# Patient Record
Sex: Male | Born: 1960 | Race: White | Hispanic: No | Marital: Married | Smoking: Never smoker
Health system: Southern US, Community
[De-identification: ages and names within clinical notes are randomized; demographics above are authoritative.]

## PROBLEM LIST (undated history)

## (undated) DIAGNOSIS — N189 Chronic kidney disease, unspecified: Secondary | ICD-10-CM

## (undated) DIAGNOSIS — I10 Essential (primary) hypertension: Secondary | ICD-10-CM

## (undated) DIAGNOSIS — G709 Myoneural disorder, unspecified: Secondary | ICD-10-CM

## (undated) DIAGNOSIS — E669 Obesity, unspecified: Secondary | ICD-10-CM

## (undated) DIAGNOSIS — G4730 Sleep apnea, unspecified: Secondary | ICD-10-CM

## (undated) DIAGNOSIS — E119 Type 2 diabetes mellitus without complications: Secondary | ICD-10-CM

## (undated) HISTORY — DX: Obesity, unspecified: E66.9

## (undated) HISTORY — DX: Sleep apnea, unspecified: G47.30

## (undated) HISTORY — PX: ROTATOR CUFF REPAIR: SHX139

## (undated) HISTORY — PX: CARPAL TUNNEL RELEASE: SHX101

## (undated) HISTORY — PX: CERVICAL SPINE SURGERY: SHX589

## (undated) HISTORY — PX: BACK SURGERY: SHX140

---

## 2000-12-14 ENCOUNTER — Encounter: Payer: Self-pay | Admitting: Pulmonary Disease

## 2000-12-14 ENCOUNTER — Ambulatory Visit (HOSPITAL_BASED_OUTPATIENT_CLINIC_OR_DEPARTMENT_OTHER): Admission: RE | Admit: 2000-12-14 | Discharge: 2000-12-14 | Payer: Self-pay | Admitting: Pulmonary Disease

## 2001-01-17 ENCOUNTER — Encounter: Admission: RE | Admit: 2001-01-17 | Discharge: 2001-04-17 | Payer: Self-pay | Admitting: Family Medicine

## 2002-10-17 ENCOUNTER — Encounter: Payer: Self-pay | Admitting: Family Medicine

## 2002-10-17 ENCOUNTER — Encounter: Admission: RE | Admit: 2002-10-17 | Discharge: 2002-10-17 | Payer: Self-pay | Admitting: Family Medicine

## 2002-10-28 ENCOUNTER — Encounter: Payer: Self-pay | Admitting: Family Medicine

## 2002-10-28 ENCOUNTER — Ambulatory Visit (HOSPITAL_COMMUNITY): Admission: RE | Admit: 2002-10-28 | Discharge: 2002-10-28 | Payer: Self-pay | Admitting: Family Medicine

## 2003-01-14 ENCOUNTER — Encounter: Admission: RE | Admit: 2003-01-14 | Discharge: 2003-01-14 | Payer: Self-pay | Admitting: Family Medicine

## 2003-02-05 ENCOUNTER — Emergency Department (HOSPITAL_COMMUNITY): Admission: EM | Admit: 2003-02-05 | Discharge: 2003-02-05 | Payer: Self-pay | Admitting: Emergency Medicine

## 2003-04-04 ENCOUNTER — Encounter: Admission: RE | Admit: 2003-04-04 | Discharge: 2003-04-04 | Payer: Self-pay | Admitting: Family Medicine

## 2003-10-11 ENCOUNTER — Encounter: Admission: RE | Admit: 2003-10-11 | Discharge: 2003-10-11 | Payer: Self-pay | Admitting: Orthopaedic Surgery

## 2003-11-13 ENCOUNTER — Ambulatory Visit (HOSPITAL_COMMUNITY): Admission: RE | Admit: 2003-11-13 | Discharge: 2003-11-13 | Payer: Self-pay | Admitting: *Deleted

## 2003-12-03 ENCOUNTER — Inpatient Hospital Stay (HOSPITAL_COMMUNITY): Admission: RE | Admit: 2003-12-03 | Discharge: 2003-12-08 | Payer: Self-pay | Admitting: Orthopaedic Surgery

## 2004-11-04 ENCOUNTER — Encounter: Admission: RE | Admit: 2004-11-04 | Discharge: 2004-11-04 | Payer: Self-pay | Admitting: Orthopaedic Surgery

## 2004-11-15 ENCOUNTER — Emergency Department (HOSPITAL_COMMUNITY): Admission: EM | Admit: 2004-11-15 | Discharge: 2004-11-15 | Payer: Self-pay | Admitting: *Deleted

## 2005-01-07 ENCOUNTER — Ambulatory Visit (HOSPITAL_COMMUNITY): Admission: RE | Admit: 2005-01-07 | Discharge: 2005-01-08 | Payer: Self-pay | Admitting: Orthopaedic Surgery

## 2005-03-15 ENCOUNTER — Ambulatory Visit: Payer: Self-pay | Admitting: Pulmonary Disease

## 2005-04-30 ENCOUNTER — Ambulatory Visit: Payer: Self-pay | Admitting: Pulmonary Disease

## 2005-05-29 ENCOUNTER — Encounter: Admission: RE | Admit: 2005-05-29 | Discharge: 2005-05-29 | Payer: Self-pay | Admitting: Orthopaedic Surgery

## 2005-08-19 ENCOUNTER — Inpatient Hospital Stay (HOSPITAL_COMMUNITY): Admission: RE | Admit: 2005-08-19 | Discharge: 2005-08-23 | Payer: Self-pay | Admitting: Orthopaedic Surgery

## 2005-08-27 ENCOUNTER — Encounter: Payer: Self-pay | Admitting: Vascular Surgery

## 2005-08-27 ENCOUNTER — Inpatient Hospital Stay (HOSPITAL_COMMUNITY): Admission: EM | Admit: 2005-08-27 | Discharge: 2005-08-28 | Payer: Self-pay | Admitting: Emergency Medicine

## 2005-10-27 ENCOUNTER — Ambulatory Visit: Payer: Self-pay | Admitting: Pulmonary Disease

## 2005-12-01 ENCOUNTER — Encounter: Admission: RE | Admit: 2005-12-01 | Discharge: 2006-02-10 | Payer: Self-pay | Admitting: Orthopaedic Surgery

## 2006-03-14 ENCOUNTER — Emergency Department (HOSPITAL_COMMUNITY): Admission: EM | Admit: 2006-03-14 | Discharge: 2006-03-14 | Payer: Self-pay | Admitting: Emergency Medicine

## 2006-08-24 ENCOUNTER — Ambulatory Visit: Payer: Self-pay | Admitting: Pulmonary Disease

## 2006-10-18 ENCOUNTER — Encounter
Admission: RE | Admit: 2006-10-18 | Discharge: 2007-01-16 | Payer: Self-pay | Admitting: Physical Medicine & Rehabilitation

## 2006-10-18 ENCOUNTER — Ambulatory Visit: Payer: Self-pay | Admitting: Physical Medicine & Rehabilitation

## 2006-11-23 ENCOUNTER — Ambulatory Visit: Payer: Self-pay | Admitting: Physical Medicine & Rehabilitation

## 2007-01-11 ENCOUNTER — Ambulatory Visit: Payer: Self-pay | Admitting: Physical Medicine & Rehabilitation

## 2007-01-11 ENCOUNTER — Encounter
Admission: RE | Admit: 2007-01-11 | Discharge: 2007-03-20 | Payer: Self-pay | Admitting: Physical Medicine & Rehabilitation

## 2007-03-03 ENCOUNTER — Telehealth (INDEPENDENT_AMBULATORY_CARE_PROVIDER_SITE_OTHER): Payer: Self-pay | Admitting: *Deleted

## 2007-03-10 DIAGNOSIS — G4730 Sleep apnea, unspecified: Secondary | ICD-10-CM | POA: Insufficient documentation

## 2007-03-13 ENCOUNTER — Ambulatory Visit: Payer: Self-pay | Admitting: Pulmonary Disease

## 2007-03-13 DIAGNOSIS — E669 Obesity, unspecified: Secondary | ICD-10-CM

## 2007-03-20 ENCOUNTER — Telehealth (INDEPENDENT_AMBULATORY_CARE_PROVIDER_SITE_OTHER): Payer: Self-pay | Admitting: *Deleted

## 2007-03-29 ENCOUNTER — Encounter: Admission: RE | Admit: 2007-03-29 | Discharge: 2007-05-02 | Payer: Self-pay | Admitting: Orthopaedic Surgery

## 2007-04-04 ENCOUNTER — Encounter: Payer: Self-pay | Admitting: Pulmonary Disease

## 2007-04-19 ENCOUNTER — Ambulatory Visit: Payer: Self-pay | Admitting: Pulmonary Disease

## 2007-05-04 ENCOUNTER — Encounter: Admission: RE | Admit: 2007-05-04 | Discharge: 2007-05-04 | Payer: Self-pay | Admitting: Orthopaedic Surgery

## 2008-01-23 ENCOUNTER — Encounter: Admission: RE | Admit: 2008-01-23 | Discharge: 2008-01-23 | Payer: Self-pay | Admitting: Orthopaedic Surgery

## 2008-02-09 ENCOUNTER — Encounter: Admission: RE | Admit: 2008-02-09 | Discharge: 2008-02-09 | Payer: Self-pay | Admitting: Orthopaedic Surgery

## 2008-04-10 ENCOUNTER — Encounter: Admission: RE | Admit: 2008-04-10 | Discharge: 2008-04-10 | Payer: Self-pay | Admitting: Orthopaedic Surgery

## 2009-01-10 ENCOUNTER — Encounter: Payer: Self-pay | Admitting: Pulmonary Disease

## 2009-10-16 ENCOUNTER — Ambulatory Visit: Payer: Self-pay | Admitting: Pulmonary Disease

## 2009-10-16 DIAGNOSIS — G4733 Obstructive sleep apnea (adult) (pediatric): Secondary | ICD-10-CM | POA: Insufficient documentation

## 2009-12-01 ENCOUNTER — Ambulatory Visit: Payer: Self-pay | Admitting: Licensed Clinical Social Worker

## 2009-12-08 ENCOUNTER — Ambulatory Visit: Payer: Self-pay | Admitting: Licensed Clinical Social Worker

## 2009-12-15 ENCOUNTER — Ambulatory Visit: Payer: Self-pay | Admitting: Licensed Clinical Social Worker

## 2009-12-23 ENCOUNTER — Ambulatory Visit: Payer: Self-pay | Admitting: Licensed Clinical Social Worker

## 2010-01-06 ENCOUNTER — Ambulatory Visit: Admit: 2010-01-06 | Payer: Self-pay | Admitting: Licensed Clinical Social Worker

## 2010-01-24 ENCOUNTER — Encounter: Payer: Self-pay | Admitting: Family Medicine

## 2010-02-03 NOTE — Assessment & Plan Note (Signed)
Summary: rov for osa   Visit Type:  Follow-up  CC:  follow up. pt states wear cpap everynight x 6 hrs a night. Pt states he is not having any prolems with mask/machine. Marland Kitchen  History of Present Illness: the pt comes in today for f/u of his known osa.  He is wearing bipap compliantly, and feels he is sleeping well with the device.  His only complaint is ongoing mask fit issues, and we can work on this.  He is also due for a new machine.  The pt feels his alertness during the day is satisfactory  Current Medications (verified): 1)  Hydrocodone-Acetaminophen 10-500 Mg Tabs (Hydrocodone-Acetaminophen) .Marland Kitchen.. 1 1/2 Tablets Two Times A Day Up To 4 Per Day As Needed 2)  Robaxin-750 750 Mg  Tabs (Methocarbamol) .Marland Kitchen.. 1 Tab Four Times A Day As Needed 3)  Colchicine 0.6 Mg  Tabs (Colchicine) .... Once Daily 4)  Allopurinol 100 Mg  Tabs (Allopurinol) .... Take 4 Tabs ( Total of 400mg ) By Mouth Once Daily 5)  Ibuprofen 800 Mg  Tabs (Ibuprofen) .... Three Times A Day 6)  Nortriptyline Hcl 25 Mg  Caps (Nortriptyline Hcl) .... At Bedtime 7)  Januvia 25 Mg Tabs (Sitagliptin Phosphate) .... Once Daily 8)  Diovan Hct 160-12.5 Mg Tabs (Valsartan-Hydrochlorothiazide) .... Once Daily 9)  Crestor 5 Mg Tabs (Rosuvastatin Calcium) .... Once Daily 10)  Aspirin 81 Mg Tabs (Aspirin) .... Once Daily  Allergies (verified): 1)  ! Keflex  Past History:  Past medical, surgical, family and social histories (including risk factors) reviewed, and no changes noted (except as noted below).  Past Medical History: Reviewed history from 04/19/2007 and no changes required. Current Problems:  OBESITY (ICD-278.00) SLEEP APNEA (ICD-780.57)    Past Surgical History: Reviewed history from 03/13/2007 and no changes required. Back surgery  Family History: Reviewed history and no changes required.  Social History: Reviewed history and no changes required.  Review of Systems       The patient complains of hand/feet  swelling.  The patient denies shortness of breath with activity, shortness of breath at rest, productive cough, non-productive cough, coughing up blood, chest pain, irregular heartbeats, acid heartburn, indigestion, loss of appetite, weight change, abdominal pain, difficulty swallowing, sore throat, tooth/dental problems, headaches, nasal congestion/difficulty breathing through nose, sneezing, itching, ear ache, anxiety, depression, joint stiffness or pain, rash, change in color of mucus, and fever.    Vital Signs:  Patient profile:   50 year old male Height:      73 inches Weight:      369 pounds BMI:     48.86 O2 Sat:      95 % on Room air Temp:     98.3 degrees F oral Pulse rate:   78 / minute BP sitting:   142 / 78  (left arm) Cuff size:   large  Vitals Entered By: Charma Igo (October 16, 2009 10:15 AM)  O2 Flow:  Room air CC: follow up. pt states wear cpap everynight x 6 hrs a night. Pt states he is not having any prolems with mask/machine.  Comments meds and allergies updated Phone number updated Charma Igo  October 16, 2009 10:16 AM    Physical Exam  General:  obese male in nad Nose:  no skin breakdown or pressure necrosis from his cpap mask Extremities:  mild edema, no cyanosis  Neurologic:  alert, does not appear sleepy, moves all 4.   Impression & Recommendations:  Problem # 1:  OBSTRUCTIVE SLEEP  APNEA (ICD-327.23) the pt is doing well with is bilevel device wrt his sleep and daytime alertness, but feels that his mask fit is not optimal.  Will work on getting a new mask from his DME, and he is also due for a new machine as well.  I have encouraged the pt to work aggressively on weight loss, and to f/u with me in one year.  He is to call if having issues with his device.  Medications Added to Medication List This Visit: 1)  Hydrocodone-acetaminophen 10-500 Mg Tabs (Hydrocodone-acetaminophen) .Marland Kitchen.. 1 1/2 tablets two times a day up to 4 per day as needed 2)  Januvia 25  Mg Tabs (Sitagliptin phosphate) .... Once daily 3)  Diovan Hct 160-12.5 Mg Tabs (Valsartan-hydrochlorothiazide) .... Once daily 4)  Crestor 5 Mg Tabs (Rosuvastatin calcium) .... Once daily 5)  Aspirin 81 Mg Tabs (Aspirin) .... Once daily  Other Orders: Est. Patient Level III [id number]) DME Referral (DME)  Patient Instructions: 1)  will get you a new bipap machine and mask 2)  work on weight loss  3)  followup with me in 56mos.   Immunization History:  Influenza Immunization History:    Influenza:  historical (09/04/2009)

## 2010-02-03 NOTE — Letter (Signed)
Summary: LMN for CPAP Supplies DENIED/[facility]  LMN for CPAP Supplies DENIED/[facility]   Imported By: Phillis Knack 01/16/2009 14:03:38  _____________________________________________________________________  External Attachment:    Type:   Image     Comment:   External Document

## 2010-05-19 NOTE — Assessment & Plan Note (Signed)
The patient follows up today.  I last saw him October 24, 2006.  He  underwent a right S1 transforaminal epidural steroid injection under  fluoroscopic guidance.  He initially had about 30% relief of pain down  his leg but he had complete relief of his hip pain, and he is quite  pleased about that.  About 6 days after the injection he was lifting a  desk and noted increasing numbness and burning pain down in the right  leg.  This has subsided to the point where he is back to his usual  decreased sensation over the sole of his foot.   He states that medications are helping with his pain and he is using the  following:  1. Hydrocodone 15 mg b.i.d.  2. Elavil 25 mg q.h.s.  This is causing excessive daytime drowsiness.  3. Robaxin 750 mg q.i.d.   The pain level averaging 9/10, described as burning.  He walks 15  minutes at a time.   REVIEW OF SYSTEMS:  Positive for numbness, trouble walking and  depression.   EXAM:  Blood pressure 151/79, pulse 69, respiratory rate is 18, O2  saturation 97% on room air.  GENERAL:  In no acute distress.  Mood and affect appropriate.  Orientation x3.  Gait is without evidence of toe drag or knee instability.  He has normal  strength in the bilateral plantar flexors, dorsiflexor, knee extensors.  He has decreased right S1 reflex compared to the left side, decreased  right S1 sensation to pinprick compared to the left side.  Hip, ankle  and knee range of motion are intact.   IMPRESSION:  1. Chronic right S1 radiculopathy.  2. Lumbar postlaminectomy syndrome, status post T10 through L5 fusion.   We discussed at great length his pain management plan.  We talked about  indications for going back to see Dr. Patrice Paradise in terms of increasing  neurologic symptoms in the right lower extremity.  These are not present  at the current time.  We did talk about frequencies of injections that  he could obtain as part of his pain management program.  We also talked  about getting an EMG NCV as a baseline in a way to more objectively  monitor his neurologic function.   We discussed this at great length.  Over 50% of his visit was spent  doing this, and he understands what the plan is and agrees.      Charlett Blake, M.D.     AEK/MedQ  D:  11/01/2006 16:21:11  T:  11/02/2006 08:40:48  Job #:  [id number]   cc:   Rennis Harding, M.D.  Fax: [phone]

## 2010-05-19 NOTE — Procedures (Signed)
NAME:  Peter Bryan, Peter Bryan                 ACCOUNT NO.:  [id number]   MEDICAL RECORD NO.:  [id number]          PATIENT TYPE:  REC   LOCATION:  [facility]                          FACILITY:  [facility]   PHYSICIAN:  Charlett Blake, M.D.DATE OF BIRTH:  [birth year]   DATE OF PROCEDURE:  01/12/2007  DATE OF DISCHARGE:                               OPERATIVE REPORT   PROCEDURE:  Right S1 transforaminal epidural steroid injection under  fluoroscopic guidance.   INDICATION:  Right lower extremity radiculopathy only partially received  by medication and other conservative care, has had good results with  previous injection done November 24, 2006.  Informed consent was  obtained after describing risks and benefits of the procedure to the  patient.  These include bleeding, bruising, infection, loss of bowel or  bladder function, temporary or permanent paralysis.  He elects to  proceed and has given written consent.  Patient placed prone on  fluoroscopy table.  Betadine prep, sterile drape, a 25-gauge inch and a  half needle was used to anesthetize skin and subcu tissue, 1% lidocaine  x2 mL.  A 22-gauge 5 inch spinal needle was inserted under fluoroscopic  guidance, right S1 foramen.  Omnipaque 180 showed good epidural spread.  The patient tolerated procedure well.  Pre and post injection vitals  stable.  Post injection instructions given.  Able to ambulate and had no  difficulty after the procedure.      Charlett Blake, M.D.  Electronically Signed     AEK/MEDQ  D:  01/12/2007 14:13:34  T:  01/12/2007 16:18:53  Job:  [id number]   cc:   Rennis Harding, M.D.  Fax: [phone]

## 2010-05-19 NOTE — Group Therapy Note (Signed)
REASON FOR CONSULTATION:  Pain management with chronic postop fusion  pain as well as evaluation for spinal injection.   HISTORY:  A 50 year old male who had back pain starting in 1992.  He had  his first low back surgery at that time.  I do not have the original  records from that.  Per his report, he underwent L2-3 laminectomy in  February, 1992.  He had an L1-2 laminectomy in November, 1993 and an L3-  4 laminectomy in November, 1995.  Then he underwent an L1-4 fusion in  December, 2005.  He underwent C3-4 fusion in January, 2007 and in  August, 2007 underwent a T10-L4 fusion.  He had no postoperative  complications, per his report.  He had a bone stimulator applied.  He  was managed with ibuprofen and hydrocodone.   Patient's medical records from Dr. Patrice Paradise indicate there was an L2-5  instrumented fusion.  He has a history of S1 radiculopathy as well.  Patient was also started by Dr. Patrice Paradise on Elavil and Robaxin.  He has had  some problems with his hands falling asleep when he was trying to type.   SOCIAL HISTORY:  Married.  Has had three children.   PAST MEDICAL HISTORY:  In addition to the above, has had hypertension  and gout.   MEDICATIONS:  1. Hydrocodone 3 tablets twice daily 5/500.  2. Methylcarbimol 750 four times a day.  3. Ibuprofen 800 t.i.d.  4. Lisinopril 10 mg daily.  5. Colchicine 0.6 daily.  6. Allopurinol 100 mg daily.  7. Elavil 25 daily.   PHYSICAL EXAMINATION:  Blood pressure is 171/93.  Recheck will be done.  Pulse 81, respiratory rate 18, O2 sat 98% on room air.  This is a large, obese male in no acute distress.  His back has tenderness to palpation along the incision area in the  lower aspect.  He has pain with extension greater than with flexion in  the lumbar spine.  He does have reduced spine range of motion and  extension at approximately 25% with flexion at about 50%.  The lower  extremity range of motion is good at the ankles, hips, and knees.   His  Faber's test refers pain to the right SI joint and on the left side more  to the lower part of the lumbar spine.  No hip discomfort.  His  sensation is normal in the upper and lower extremities.  Spurling's test  is negative.  Range of motion is normal in the upper extremities.  Gait  is without evidence of toe drag or knee instability.  Additional sensory  testing reveals decreased right L3 sensation and decreased deep tendon  reflexes throughout uppers, but asymmetric in the right patella and  right ankle.   IMPRESSION:  1. Chronic postoperative pain following T10-L5 fusion.  In fact, has      had multiple lumbar procedures and has had chronic pain essentially      for 15 years.  He has reported facet arthropathy, L5-S1 area.  His      last MRI was in 2007.  Films are not available for my review;      however, plain films, lumbar, from one year ago, are available but      poor quality online.  They do reveal instrumentation, T10-L4.  2. Right S1 radiculopathy.  May benefit from epidural.  3. 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  4. Upper extremity tingling and numbness, question carpal tunnel  versus cervical radiculitis.  His last MRI of the cervical spine      without contrast on May 25, 2005 indicates a C6-7 fusion and a C3      disk to the right and C7-T1 disk to the left.  This does not      correlate with his hand symptoms except on the left side.   Will consider an EMG, NCV's, as he may have peripheral nerve  compression, focal compressive neuropathy.   I will see him back initially for the facet injections.   We will do a urine drug screen today.      Charlett Blake, M.D.  Electronically Signed     AEK/MedQ  D:  10/18/2006 13:32:35  T:  10/19/2006 08:56:07  Job #:  [id number]   cc:   Rennis Harding, M.D.  Fax: [phone]

## 2010-05-19 NOTE — Procedures (Signed)
NAME:  CHRISTIN, JONES                 ACCOUNT NO.:  [id number]   MEDICAL RECORD NO.:  [id number]          PATIENT TYPE:  REC   LOCATION:  [facility]                          FACILITY:  [facility]   PHYSICIAN:  Charlett Blake, M.D.DATE OF BIRTH:  26-Jun-1960   DATE OF PROCEDURE:  DATE OF DISCHARGE:                               OPERATIVE REPORT   Mr. Gress is a 50 year old male with chronic S1 radiculitis who has had  good relief with the S1 transforaminal injection.   Pain is only partially relieved by medication management and interferes  with self-care and mobility.   Informed consent was obtained after describing the risks and benefits of  the procedure to the patient.  These include bleeding, bruising,  infection, loss of bowel and bladder function, temporary or paralysis.  He elects to proceed and has given written consent.  The patient placed  prone on fluoroscopy table.  Betadine prep, sterile drape.  25-gauge 1-  1/2-inch needle was used to anesthetize the skin and subcu tissue with  1% lidocaine x2 mL.  A 22-gauge 5-inch spinal needle was inserted under  fluoroscopic guidance into the right S1 foramen.  AP and lateral images  utilized.  Omnipaque 180 showed good epidural spread.  The patient  tolerated the procedure well.  Pre and post-injection vitals stable.  Post-injection instructions given.  A prescription written for  nortriptyline 10 q.h.s.      Charlett Blake, M.D.  Electronically Signed     AEK/MEDQ  D:  11/24/2006 09:56:48  T:  11/24/2006 16:32:25  Job:  [id number]   cc:   Rennis Harding, M.D.  Fax: [phone]

## 2010-05-19 NOTE — Procedures (Signed)
Peter Bryan, Peter Bryan                 ACCOUNT NO.:  [id number]   MEDICAL RECORD NO.:  [id number]          PATIENT TYPE:  REC   LOCATION:  [facility]                          FACILITY:  [facility]   PHYSICIAN:  Charlett Blake, M.D.DATE OF BIRTH:  11-14-1960   DATE OF PROCEDURE:  10/24/2006  DATE OF DISCHARGE:                               OPERATIVE REPORT   PROCEDURE:  Lumbar epidural steroid injection under fluoroscopic  guidance.   INDICATIONS:  Severe radicular symptoms only partially responsive to  medication management.  Pain is rated as a 9 out of 10 level.  Pain  interferes with sleep and even problems with toileting.  He can no  longer work.   Informed consent was obtained after describing the risks and benefits of  the procedure to the patient.  These include bleeding, bruising,  infection, loss of bowel and bladder function, temporary or permanent  paralysis.  He elects to proceed and has given written consent.  The  patient was placed prone on the fluoroscopy table and Betadine prepped  and sterilely draped.  A 25-gauge, 1.5-inch needle was used to  anesthetize the skin and subcutaneous tissue with 1% lidocaine x2 mL and  a 22-gauge, 3.5-inch spinal needle was inserted under fluoroscopic  guidance into the right S1 sacral foramen.  AP, lateral and oblique  imaging was utilized. Omnipaque 180 x1 mL demonstrated good foraminal  spread and the solution containing 1 mL of 40 mg/mL Depo-Medrol and 2 mL  of 1% of lidocaine was injected.  The patient tolerated the procedure  well.  Preinjection and postinjection vitals were stable.      Charlett Blake, M.D.  Electronically Signed     AEK/MEDQ  D:  10/26/2006 15:23:23  T:  10/27/2006 12:35:56  Job:  [id number]

## 2010-05-19 NOTE — Assessment & Plan Note (Signed)
Peter Bryan has a history of T10 to L5 fusion.  He has S1 radiculopathy  with chronic right lower extremity pain.  He underwent right S1  transforaminal epidural.   CURRENT MEDICATIONS:  1. Hydrocodone 15 mg b.i.d.  2. Robaxin 750 q.i.d.  3. Ibuprofen 800 t.i.d.  4. Elavil 25 q.h.s.  5. In addition he is on Lisinopril.  6. Colchicine.  7. Allopurinol.   INTERVAL HISTORY:  S1 transforaminal epidural steroid injection  10/24/06, he had about a 30%-40% reduction in pain initially but then  about 6 days after the injection he developed increasing burning pain in  his right leg.  He has had no fever, chills.  He has had no back pain in  that injection site area.  His average pain is around 9/10.  Currently  7.  Interferes with activity at 7/10 level, sleep is poor.  Relief from  meds, however, is good. He can walk 15 minutes at a time.   REVIEW OF SYSTEMS:  Positive for numbness, trouble walking, depression.   PHYSICAL EXAMINATION:  VITALS:  Blood pressure 151/79, pulse 69,  respirations 18, O2 sat 97% on room air.  GENERAL:  Sensation is equal bilateral L5 dermatomes, reduced S1  compared to the left.  He has bilateral symmetric L4 dermatomal  sensation.  EXTREMITIES:  Deep tendon reflexes 1+ right ankle, 2+ left ankle. His  strength is equal in the plantar flexion of his ankle, dorsiflexors and  the extensors.  His gait is without evidence of toe drag or knee  instability.   IMPRESSION:  1. Chronic right S1 radiculopathy.  2. There is some exacerbation of pain but this started about 6 days      after the injection.  We spoke about his activities at that time      and in fact he remembers moving a desk on the day that he started      having the increased symptoms.  We discussed that this is more      likely the etiology.  He does not have any new neurologic deficits      and certainly his symptoms seem to be resolving at this time as      well.   PLAN:  1. Will go ahead and  continue his medications.  Will change his      hydrocodone 7.5 to 2 tablets twice a day.  2. Excessive drowsiness in the morning from Elavil.  Will switch to      Nortriptyline 25 mg q.h.s.  3. Discontinue Robaxin 750 q.i.d.   I have an appointment to see him on the 20th for repeat injection. We  discussed plans and he agrees.      Charlett Blake, M.D.  Electronically Signed     AEK/MedQ  D:  11/01/2006 16:17:10  T:  11/02/2006 08:35:21  Job #:  [id number]   cc:   Rennis Harding, M.D.  Fax: [phone]

## 2010-05-19 NOTE — Assessment & Plan Note (Signed)
Patient returns today, he was last seen by me November 24, 2006.  A 50-  year-old male, chronic S1 radiculitis.  He has a thoracolumbar fusion in  2007, chronic pain since that time.  His last prescription for  hydrocodone was November 24, 2006.  He was to come back on December 20  but needed to reschedule.  He states that he has called office multiple  times, there has been no documentation.  He states he talked to April  our front desk person.  Once again, she did not remember this  conversation.   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  He states that he received the  methadone from his wife.   Had discussion with Mr. Locklar about this, stating that that is a  violation of his controlled substance agreement.  This would result in a  discharge.  He was upset about this.  He will plan to follow up with Dr.  Patrice Paradise.      Charlett Blake, M.D.  Electronically Signed     AEK/MedQ  D:  01/12/2007 14:16:52  T:  01/12/2007 15:24:54  Job #:  [id number]   cc:   Rennis Harding, M.D.  Fax: [phone]

## 2010-05-22 NOTE — Op Note (Signed)
NAME:  Peter Bryan, Peter Bryan                 ACCOUNT NO.:  [id number]   MEDICAL RECORD NO.:  [id number]          PATIENT TYPE:  INP   LOCATION:  5001                         FACILITY:  [facility]   PHYSICIAN:  Rennis Harding, M.D.        DATE OF BIRTH:  06-28-1960   DATE OF PROCEDURE:  12/04/2003  DATE OF DISCHARGE:                                 OPERATIVE REPORT   DIAGNOSES:  1.  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  2.  Degenerative disc disease.  3.  Obesity.   PROCEDURES:  1.  Revision L1 through L4 lumbar laminectomy with decompression of the      common thecal sac and nerve roots bilaterally.  2.  Transforaminal lumbar interbody fusion L1-2 and L2-3 with placement of      two PEEK cages.  3.  Segmental pedicle screw instrumentation L1 through L4 using bilateral      Concept system.  4.  Posterior spinal arthrodesis L1 through L4.  5.  Local autogenous bone graft supplemented with 15 mL Grafton allograft      and BMP.  6.  Monitoring with testing of each pedicle screws, monitoring of free      running EMG x3 hours.   SURGEON:  Rennis Harding, M.D.   ASSISTANT:  Karenann Cai, P.A.   ANESTHESIA:  General endotracheal anesthesia.   COMPLICATIONS:  None.   INDICATIONS FOR PROCEDURE:  The patient is a very pleasant 50 year old male  who is status post two previous lumbar laminectomies.  He has recurrent  spinal stenosis and degenerative spondylolisthesis and has elected to  undergo revision decompression and fusion in hopes of improving his  symptoms.   DESCRIPTION OF PROCEDURE:  The patient was properly identified in the  holding area, taken to the operating room and underwent general endotracheal  anesthesia without difficulty.  Given prophylactic IV antibiotics.  Carefully turned prone onto the AcroMed four poster positioning frame.  All  bony prominences were padded.  Face and eyes were protected at all times.  The back was prepped and draped in the usual  sterile fashion.  His previous  midline incision was utilized and extended several inches proximally.  Dissection was carried through the previous scar and deep fascia.  The  paraspinal muscles were elevated out over the tips of the transverse  processes of L1, L2, L3 and L4 bilaterally.  The patient had a very deep  layer of adipose tissue followed by thick muscle.  Deep retractors were  placed.  X-rays were taken to confirm appropriate level.  We found the facet  joints to be hypertrophied and unstable L2-3 was most severely involved.  He  had a previous laminectomy defect at L2-3 and L3-4.  We completed a revision  laminectomy by removing the spinous process and residual lamina of L4 and  working proximally through the epidural fibrosis.  Loupes and headlight  magnification were used to complete the neurolysis.  Great care was taken to  protect the underlying dura.  Curettes were used to dissect the edges of the  previous laminectomy.  Radical decompression was completed decompressing the  thecal sac centrally and then the lateral recess.  We found severe  underlying stenosis at L2-3 greater than L1-2 and then less severe at L3-4.  There was a large disc herniation at L2-3 which was impinging upon the  transversing nerve root and placing the thecal sac dorsally.  This was  removed with pituitary.  We then turned our attention to placing pedicle  screws L1 through L4 bilaterally using anatomic probing technique.  The  pedicles could also be palpated from within the spinal canal.  We had  excellent screw purchase.  The pedicle holes were palpated.  There were no  breeches.  Each pedicle screw was __________ using triggered EMGs and there  were no deleterious changes.  We then turned our attention to the  transforaminal lumbar interbody fusion at L1-2 and L2-3.  Radical  facetectomies completed at both the L1-2 and L2-3 levels to allow creation  of the transforaminal window.  The exiting  and transversing nerve roots were  identified and protected at all times.  Free running EMGs were monitored.  Radical discectomies completed across the contralateral side.  The  cartilaginous end plates were scraped.  The disc spaces were packed with BMP  sponges from the large infused kit as well as local bone graft obtained on  the laminectomy.  The PEEK cages were packed with the bone morphogenic  protein, collagen soaked sponges and inserted obliquely into the interspace.  There were no deleterious changes in the free running EMGs.  We then turned  our attention to completing the posterior spinal fusion by decorticating the  transverse processes of L1, L2, L3 and L4 bilaterally.  The remaining local  bone graft was then packed into the lateral gutters and supplemented with 15  mL of Grafton allograft.  Titanium rods were placed into the polyaxial screw  heads and gentle compression applied across the fused segments before  shearing off the locking cap.  Two cross connectors were placed.  The wound  was irrigated.  Deep Hemovac drain was left in place.  Deep fascia closed  with a running #1 Vicryl suture, subcutaneous layer closed with 0 Vicryl and  2-0 Vicryl followed by a running 3-0 nylon suture to approximate the skin  edges.  Sterile dressing applied.  The patient was turned supine and  extubated without difficulty and transferred to recovery in stable condition  able to move his upper and lower extremities.       MC/MEDQ  D:  12/05/2003  T:  12/06/2003  Job:  [id number]

## 2010-05-22 NOTE — H&P (Signed)
NAME:  MAKYA, LINGREN NO.:  [id number]   MEDICAL RECORD NO.:  [id number]          PATIENT TYPE:  INP   LOCATION:  NA                           FACILITY:  [facility]   PHYSICIAN:  Rennis Harding, M.D.        DATE OF BIRTH:  May 20, 1960   DATE OF ADMISSION:  12/04/2003  DATE OF DISCHARGE:                                HISTORY & PHYSICAL   CHIEF COMPLAINT:  Severe back pain with radiation into both legs.   HISTORY OF PRESENT ILLNESS:  This 50 year old white male is seen by [place] for  progressive and persistent problems concerning pain in his lumbosacral area  with radiation into both lower extremities. The patient has had three  previous back surgeries. He had an L3-4 laminectomy and L4-5 fusion. Another  surgery was performed to the lumbar spine. The patient has had epidural  steroid injections to the lumbar spine with unfortunately no benefit. He  also has had anti-inflammatories. Fortunately, he has maintained a normal  neurological pattern in the lower extremities other than radiculitis.  Straight leg raise is also negative. MRIs have been reviewed and showed some  moderate spinal stenosis of L1-2 with a large central disk herniation at L2-  3 and evidence of interbody fusion at L4-5 with stenosis. Due to the fact  that he has a recurrent and persistent low back pain and after all the risks  and benefits of surgery have been discussed with the patient by Dr. Patrice Paradise  and then myself he would like to proceed with surgery.   The patient is seen today for pre-admission history and physical on November 27, 2003, and fitted with EBI lumbosacral orthosis.   PAST MEDICAL HISTORY:  This gentleman has been in relatively good health.  Dr. Leone Haven with [facility], phone number [phone], has  cleared him for surgery. He has hypertension. No known medical allergies.  Previous surgeries are lumbar laminectomy and diskectomy at L4-5 in February  1992, lumbar diskectomy at  L3-4 in November 1993, and diskectomy of L1, L2,  and L3 in December 1995.   FAMILY HISTORY:  Positive for hypertension in his mother and paternal  grandmother. Heart disease in paternal grandmother and paternal grandfather.   SOCIAL HISTORY:  The patient is married. He is a Research officer, trade [employer]. No  intake of tobacco or alcohol products. He has three children. His family  will be caregiver after surgery. He lives in a second floor apartment with  14 stairs to the apartment. He will need to have adequate physical therapy  postoperative so that he can negotiate these stairs safely.   PHYSICAL EXAMINATION:  VITAL SIGNS: Pulse 80, respirations 12, blood  pressure 142/72.  GENERAL: An alert, cooperative, friendly 50 year old white male who is  somewhat obese.  HEENT:  Normocephalic. PERRLA. Oropharynx is clear. EOMs are intact.  CHEST: Clear to auscultation. No rales or rhonchi.  HEART: Regular rate and rhythm.  No murmurs are heard.  ABDOMEN: Soft, nontender. Liver and spleen not felt.  RECTAL/GENITALIA: Not done; not pertinent to the present illness.  EXTREMITIES: As in present illness above with no neurological motor deficits  in the lower extremities. Negative straight leg raise and reflex is equal.   ADMISSION DIAGNOSES:  1.  Lumbar degenerative scoliosis and spinal stenosis, L1 through L4.  2.  Hypertension.   PLAN:  The patient will undergo L1 to L4 laminectomy with posterior spinal  fusion with pedicle screws, transforaminal lumbar interbody fusion with  allograft and bone morphogenic protein. Again, he is fitted with an ABI and  lumbosacral orthosis today (November 27, 2003) to be used postoperatively.  He will also need a rolling Kogler postoperatively to assist him coming from  a seated to a standing position. He will need extensive physical therapy as  mentioned above so that he can negotiate the 14 steps to his second floor  apartment.       DLU/MEDQ  D:  11/27/2003   T:  11/27/2003  Job:  [id number]   cc:   Leone Haven, M.D.  [address]  [place]  [place] [address]  Fax: [phone]

## 2010-05-22 NOTE — H&P (Signed)
Peter Bryan, SENDER                 ACCOUNT NO.:  [id number]   MEDICAL RECORD NO.:  [id number]          PATIENT TYPE:  INP   LOCATION:  5036                         FACILITY:  [facility]   PHYSICIAN:  Karenann Cai, P.A.    DATE OF BIRTH:  1960-11-05   DATE OF ADMISSION:  08/19/2005  DATE OF DISCHARGE:  08/23/2005                                HISTORY & PHYSICAL   CHIEF COMPLAINT:  Mid and low back pain.   HISTORY OF PRESENT ILLNESS:  Patient is a 50 year old male who has undergone  several previous lumbar surgeries including lumbar fusion.  He was fused  from L1-L5.  He unfortunately has had an increasing pain over the last  several months, actually few years.  His pain is severe and affecting his  activities of daily living and his quality of life has suffered  significantly.  Dr. Patrice Paradise did find a supra-adjacent herniated disk at T12-  L1.  Because of this and his failure to improve it was thought his best  course of management would be extending his fusion and decompressing that  level.  Given the patient's size as well as previous fusions it was thought  we needed to go up several levels to get the appropriate fixation to avoid  hardware failure.  Risks and benefits of this surgery were discussed with  the patient by Dr. Patrice Paradise as well as myself.  He indicated understanding and  opted to proceed.   ALLERGIES:  None.   MEDICATIONS:  Vicodin, Robaxin, ibuprofen, hydrochlorothiazide, and  colchicine.   PAST MEDICAL HISTORY:  1. Sleep apnea.  2. Hypertension.   PAST SURGICAL HISTORY:  Lumbar surgery x6 and ACDF.   SOCIAL HISTORY:  Patient is married.  Denies tobacco use.  Denies alcohol  use.  Has three children.   FAMILY HISTORY:  Noncontributory.   REVIEW OF SYSTEMS:  Negative.   PHYSICAL EXAMINATION:  VITAL SIGNS:  Pulse is 80 and regular, respirations  16 and unlabored, blood pressure is 130/84.  GENERAL APPEARANCE:  Patient is a 50 year old white male who is alert and  oriented, no acute distress.  Well-nourished, well-groomed.  Appears stated  age.  Pleasant, cooperative to examination.  HEENT:  Head is normocephalic, atraumatic.  Pupils equal, round, reactive to  light.  Nares patent.  Pharynx is clear.  CHEST:  Clear to auscultation bilaterally.  No rales, rhonchi, strider,  wheeze, or friction rubs.  BREASTS:  Not pertinent.  Not performed.  HEART:  S1, S2.  Regular rate and rhythm.  No murmurs, rubs, or gallops  noted.  ABDOMEN:  Soft to palpation, nontender, nondistended.  No organomegaly  noted.  GENITOURINARY:  Not pertinent, not performed.  EXTREMITIES:  As per HPI.  SKIN:  Intact without any lesions or rashes.   IMPRESSION:  1. Degenerative disk disease and adjacent segment herniated nucleus      pulposus at T12-L1.  2. Hypertension.  3. Gout.   PLAN:  Admit to [facility] on August 16 for revision decompression  and posterior spinal fusion from T10-L5.  The surgery will be bone  by Dr.  Patrice Paradise.  In the hospital patient will need his BiPAP machine.  We would like  him to use the setting from home.  We will also get a bariatric bed for him  due to his size.      Karenann Cai, P.A.     CM/MEDQ  D:  08/25/2005  T:  08/25/2005  Job:  [id number]

## 2010-05-22 NOTE — Op Note (Signed)
NAME:  Peter Bryan, Peter Bryan                 ACCOUNT NO.:  [id number]   MEDICAL RECORD NO.:  [id number]          PATIENT TYPE:  INP   LOCATION:  5036                         FACILITY:  [facility]   PHYSICIAN:  Rennis Harding, M.D.        DATE OF BIRTH:  Nov 14, 1960   DATE OF PROCEDURE:  08/19/2005  DATE OF DISCHARGE:                                 OPERATIVE REPORT   DIAGNOSES:  1. T12-L1 disk herniation with spinal stenosis.  2. Adjacent segment disease above previous L1-L5 decompression and fusion.  3. Degenerative disk disease and the lumbar spondylosis.   PROCEDURE:  1. Removal of pedicle screw instrumentation segmental from L1-L4.  2. Exploration of spinal fusion, L1-L4.  3. Revision laminectomy T12-L1 and L1-L2 bilaterally with wide      decompression of the thecal sac and nerve roots.  4. Posterior spinal arthrodesis T10-L1.  5. Segmental pedicle screw instrumentation T10 down to L4 using the Abbott      spine encompass system.  6. Transforaminal lumbar interbody fusion T12-L1 from the right side.  7. Local autogenous bone graft supplemented with bone morphogenic protein.   SURGEON:  Rennis Harding, MD   ASSISTANT:  Jaclyn Shaggy Mahar, PA   ANESTHESIA:  General endotracheal.   ESTIMATED BLOOD LOSS:  900 mL.   COMPLICATIONS:  None.   NEEDLE AND SPONGE COUNT:  Correct.   INDICATIONS:  The patient is a pleasant 50 year old male who is status post  multiple previous lumbar surgeries with L1-L4 instrumented decompression and  fusion.  There is an autofusion at L4-5 from a previous diskitis.  He did  well after his last surgery for several years.  Unfortunately he is a very  large man and he has developed degenerative changes at T12-L1 with large  central and right-sided disk herniation with compression on the conus.  He  has been having persistent symptoms in his lower extremities with numbness  and pain.  We have tried various conservative treatments without any  improvement.  We talked about  various surgical options including extending  the decompression and fusion up across the thoracolumbar junction and also  possibly extending the fusion across L5-S1.  After thorough discussion of  the risk and benefits he has elected to now proceed with surgery in hopes of  improving his symptoms.   PROCEDURE:  The patient was identified in the holding area, after informed  consent he was taken to the operating room.  He underwent general  endotracheal anesthesia.  Neuro monitoring was established in the form of  lower extremity EMGs, SEPs and motor evoked potentials.  Prophylactic IV  antibiotics were given.  He was turned prone onto the Acromed four-poster  positioning frame.  All bony prominences were padded.  His face and eyes  were protected at all times.  Because of his large size we could not  completely decompress the abdomen.  The back was prepped and draped usual  sterile fashion.  The previous midline incision was utilized and extended  several inches proximally up above the thoracolumbar junction.  Dissection  was carried through the  previous scar and deep fascia.  Subperiosteal  exposure was carried out to the tips of the transverse processes of T10,  T11, T12 and also exposing the previous instrumentation, all the way down  the L4.  The dissection was very tedious.  Because of his very large body  habitus, this was very difficult.  We eventually achieved exposure and  placed deep retractors.  We then used the appropriate screwdrivers to remove  the caps from the pedicle screws as well as the rods.  We then pulled on the  pedicle screws and there did not appear to be any motion between L1 and L4.  We then carefully dissected out over the fusion mass using the  electrocautery and could not identify any deficits in the fusion mass.  We  felt that the fusion was solid between L1-L4.  We then turned our attention  to completing the laminectomy from the T12-L1 and L1-L2.  The  remaining  spinous process and lamina of L1 was removed using Kerrison punches and  Leksell rongeur.  The previous laminectomy edges were carefully dissected  free using loupes and headlight magnification.  There was a large amount of  scar tissue which had to be worked through and eventually the underlying  spinal canal could be identified.  Once we had the appropriate plane a high-  speed bur was used to further thin the lamina.  The entire spinous process  and lamina of T12 was also removed.  We found severe spinal stenosis at T12-  L1 and as we decompressed the bone, the cord expanded into the defect.  The  decompression was carried out laterally flush with the pedicles.  We  identified the T12 and the L1 nerve roots and confirmed that there were well  decompressed.  We then turned our attention to placing pedicle screws at  T10, T11 and T12 bilaterally.  Using anatomic probing technique and also  direct palpation from within the spinal canal, each starting point was  identified.  The starting point was initiated with the awl.  This Steffee  pedicle probe was then used to cannulate the pedicle.  Each pedicle was  palpated with the ball-tip feeler and there were no breeches.  Each pedicle  was tapped.  We utilized 5.5 x 45 mm screws at T10 and T11 and 6.0 x 45 mm  screws at T12.  The screw purchase was excellent and the bone quality was  good.  Each pedicle screw was then stimulated using triggered EMGs and there  were no deleterious changes.  At this point we elected to perform a  transforaminal lumbar interbody fusion at T12-L1 on the right side to  further decompress the spinal canal and remove the central disk herniation  and also improve the fusion rate across the thoracolumbar junction.  The  remaining facette joint at T12-L1 on the right side was osteotomized.  The  exiting and transversing nerve roots were identified and protected.  The disk herniation was identified.  Epidural  bleeders were coagulated using  bipolar.  The disk space was entered.  Using an Epstein curette, the central  disk rupture was decompressed back into the interspace.  It was then simply  removed with pituitary rongeurs.  At this point we could scrape the  cartilaginous endplates and complete a radical diskectomy across the  contralateral side.  We then packed the disk space with BMP sponges from the  Infuse kit along with local bone graft from the laminectomy.  We then  completed the posterior spinal fusion by decorticating the transverse  processes and remaining lamina of T10, T11, T12 and also the fusion mass at  L1.  The remaining local bone graft was packed into the gutters.  We then  cut titanium rods.  The rods were bent into normal lordosis.  The rods were  placed into the polyaxial screw heads from T10 all the way down to L4.  The  locking caps were applied.  Compression was applied across the T12-L1  segment before shearing off the locking caps..  The wound was irrigated.  Two cross connectors were placed.  Gelfoam was left over the exposed  epidural space.  A deep Hemovac drain was left.  The deep fascia closed with  interrupted #1 Vicryl suture.  Subcutaneous layer closed with 0 Vicryl  followed by 2-0 Vicryl suture.  The skin approximated using 3-0 interrupted  nylon suture.  Sterile dressing applied.  It should be noted that my  assistant [facility], PA was  present throughout the procedure including during positioning, during the  exposure, during the decompression, fusion, the instrumentation and also  with the entire wound closure.  There were no deleterious changes in the  neuro monitoring throughout the procedure.      Rennis Harding, M.D.  Electronically Signed     MC/MEDQ  D:  08/19/2005  T:  08/20/2005  Job:  [id number]

## 2010-05-22 NOTE — Discharge Summary (Signed)
Peter Bryan, Peter Bryan                 ACCOUNT NO.:  [id number]   MEDICAL RECORD NO.:  [id number]          PATIENT TYPE:  INP   LOCATION:  5036                         FACILITY:  [facility]   PHYSICIAN:  Rennis Harding, M.D.        DATE OF BIRTH:  [birth year]   DATE OF ADMISSION:  08/19/2005  DATE OF DISCHARGE:  08/23/2005                                 DISCHARGE SUMMARY   ADMITTING DIAGNOSES:  1. Spinal stenosis and HNP T12-L1.  2. Hypertension.  3. Gout.  4. Obesity.  5. Sleep apnea.   DISCHARGE DIAGNOSES:  1. Status post T10-L4 posterior spinal fusion.  2. Blood loss anemia.  3. Hyperglycemia postoperatively.  4. Hypertension.  5. Gout.  6. Obesity.  7. Sleep apnea.   PROCEDURE:  On August 19, 2005, patient was taken to the operating room for  a revision of posterior spinal fusion from T10-L4 and T12-L1 laminectomy and  discectomy.  Surgeon was Rennis Harding, assistant, [employer], PA-C.  Anesthesia was general.   CONSULTS:  None.   LABS:  Preop CBC with diff was normal with the exception of hemoglobin of  11.9, hematocrit of 35.0.  Postoperatively, hemoglobin and hematocrit were  monitored x4 days, reached a low of 9.3 and 27.4.  On August 21, 2005, coags  were normal preoperatively with the exception of PTT of 39.  Complete  metabolic panel preoperatively was normal with the exception of glucose of  136 and albumin of 3.4.  Postoperatively, basic metabolic panel monitored x3  days, glucose was elevated ranging between 110 and 141, calcium was 8.3 on  August 18.  UA from August 10 was negative.  Blood typing from August 10 was  O-positive, antibody screen was negative.  Urine culture from August 10  showed no growth.   No EKG on the chart.   BRIEF HISTORY:  This is a 50 year old male who has had a long history of  problems with his back.  He has undergone previous back surgeries and  done  well with it.  Unfortunately, he has had a herniated disc above his previous  fusion at  T12-L1.  He has failed to respond to conservative treatment and;  therefore, it was felt his best course of management would be a revision  posterior spinal fusion with the  extension of fusion up to T10.  Risks and  benefits of the procedure were discussed with the patient by Dr. Patrice Paradise as  well as myself, he indicated understanding and opted to proceed.   HOSPITAL COURSE:  On August 19, 2005, the patient was taken to the operating  room for the above-listed procedure.  He tolerated procedure well without  any intraoperative complications and he was transferred to the recovery room  in stable condition.   Postoperatively, routine orthopedic spine protocol was followed and he  progressed along well.  Physical therapy and occupational therapy worked  with patient on a daily basis and he made good progress.  He met all  orthopedic goals and was stable prior to discharge.   Medically, he progressed along well.  He did develop postoperative anemia  with a hemoglobin of 89.3 and was symptomatic on August 20, 2005; therefore,  he was transfused two units of packed red blood cells on that day.  His  hemoglobin responded nicely by the following day.   On August 23, 2005, patient was doing very well, he was orthopedically  stable as well as medically stable and ready for discharge to home.   DISCHARGE PLAN:  Patient is a 50 year old male status post posterior final  fusion doing well, activity is daily ambulation program.  Brace on when he  is up.  Daily dressing change.  Keep incision dry and packed until all  drainage stops.  No lifting heavier than five pounds.  Back precautions at  all times.  Follow up in two weeks postoperatively with Dr. Patrice Paradise.  Regular  diet as tolerated.   MEDICATIONS:  Norco for pain, Robaxin for muscle spasm, multivitamin daily,  calcium daily, laxative as needed.   CONDITION ON DISCHARGE:  Stable, improved.   DISPOSITION:  The patient will be discharged to his  home with his family's  assistance as well as home health physical therapy and occupational therapy.      Karenann Cai, P.A.      Rennis Harding, M.D.  Electronically Signed    CM/MEDQ  D:  11/04/2005  T:  11/04/2005  Job:  [id number]

## 2010-05-22 NOTE — Op Note (Signed)
NAME:  Peter Bryan, Peter Bryan                 ACCOUNT NO.:  [id number]   MEDICAL RECORD NO.:  [id number]          PATIENT TYPE:  OIB   LOCATION:  [id number]                         FACILITY:  [facility]   PHYSICIAN:  Rennis Harding, M.D.        DATE OF BIRTH:  Feb 12, 1960   DATE OF PROCEDURE:  01/07/2005  DATE OF DISCHARGE:                                 OPERATIVE REPORT   DIAGNOSIS:  Herniated nucleus pulposus,  C6-7.   PROCEDURE:  1.  Anterior cervical diskectomy C6-7.  2.  Anterior cervical arthrodesis C6-7, placement of 9 mm allograft      prosthesis spacer packed with local autogenous bone graft.  3.  Anterior cervical plating C6-7 using the Abbott spine system.   SURGEON:  Rennis Harding, MD   ASSISTANT:  Karenann Cai, P.A.   ANESTHESIA:  General endotracheal.   COMPLICATIONS:  None.   ESTIMATED BLOOD LOSS:  Minimal.   INDICATIONS:  The patient is a pleasant 50 year old male with neck and upper  extremity pain felt to be secondary to a cervical spondylosis and HNP at C6-  7. He has failed to respond all conservative treatment. He is elected to  undergo ACDF at C6-7 in hopes of improving symptoms. I had initially talked  to him about doing a fusion at C5-6 and C6-7. After careful review of the  MRI scan, there is no significant central or foraminal narrowing at the C5-6  level and it was not felt that this would add any benefit. We therefore  limited the operation to the C6-7 level where he had the ruptured disk.   PROCEDURE:  The patient was identified in the holding area taken to the  operating room where he underwent general endotracheal anesthesia without  difficulty, given prophylactic IV antibiotics. Carefully positioned supine  with the [place] head rest, his neck in slight extension. The neck was  prepped and draped usual sterile fashion, 5 pounds of halter traction  applied. The transverse incision was made left side of the neck just below  the cricoid cartilage. Dissection was carried  sharply through the deep  fascia and platysma. The interval between the SCM and strap muscles medially  was developed down to the prevertebral space. The esophagus, trachea,  carotid sheath were identified and protected all times. A spinal needle was  placed into the C5-6 disk space and intraoperative x-ray was taken to  confirm this level. We then worked down to the C6-7 level, elevating the  longus coli muscle over the disk space. Deep Shadowline retractor placed.  the Leica microscope was draped and brought into the field. Caspar  distraction pins were placed in C6-C7 vertebral bodies. Gentle distraction  applied. Annulotomy created radical diskectomy, carried back to the  posterior longitudinal ligament. There was a large rent in the PLL in the  midline and off to the right side. The ligament was opened and multiple  fragments of disk material were removed which had migrated out between the  cephalad vertebral body. We then completed foraminotomies bilaterally. We  felt we had a good decompression. Hemostasis was  achieved. We then prepared  the vertebral endplates and inserted an 9-mm allograft prosthesis spacer  which had been packed with local bone graft from the drill shavings. This  was carefully countersunk 3 mm into the interspace. The Caspar distraction  pins were removed. The halter traction was taken down. We then placed a 26  mm Abbott spine anterior cervical plate with four 14 mm screws. We had good  screw purchase and ensured that the locking mechanism engaged. The wound was  irrigated. The esophagus, trachea, carotid sheath were inspected. No  apparent injuries. Deep Penrose drain left in place. The platysma closed  with interrupted 2-0 Vicryl, subcutaneous layer closed with 3-0 Vicryl,  followed by a running 4-0 subcuticular Vicryl suture on the skin edges.  Benzoin, Steri-Strips placed. Sterile dressing applied. The patient was  placed into a cervical collar, extubated  without difficulty and transferred  to recovery stable condition.      Rennis Harding, M.D.  Electronically Signed     MC/MEDQ  D:  01/07/2005  T:  01/08/2005  Job:  [id number]

## 2010-05-22 NOTE — Discharge Summary (Signed)
Peter Bryan, Peter Bryan                 ACCOUNT NO.:  [id number]   MEDICAL RECORD NO.:  [id number]          PATIENT TYPE:  INP   LOCATION:  5001                         FACILITY:  [facility]   PHYSICIAN:  Rennis Harding, M.D.        DATE OF BIRTH:  [birth year]   DATE OF ADMISSION:  12/03/2003  DATE OF DISCHARGE:  12/08/2003                                 DISCHARGE SUMMARY   ADMISSION DIAGNOSES:  1.  Degenerative scoliosis with spinal stenosis.  2.  Hypertension.   DISCHARGE DIAGNOSES:  1.  Status post L1 to L4 laminectomy and posterior spinal fusion, doing      well.  2.  Hypertension.  3.  Postoperative anemia, stable.   CONSULTATIONS:  None.   PROCEDURE:  On December 04, 2003, the patient was taken to the operating  room for L1 through L4 posterior spinal fusion with pedicle screws and  laminectomy, L1-2 and L2-3 P-LIF.   SURGEON:  Rennis Harding, M.D.   ASSISTANT:  Karenann Cai, P.A.   ANESTHESIA:  General.   LABORATORY DATA:  Preoperative labs revealed CBC with difficult within  normal limits. Postoperative H&H were monitored daily, 9.0 and 25.7 on  December 07, 2003, asymptomatic and did not require any treatment. Complete  metabolic panel preoperatively was within normal limits with the exception  of a slight elevation of ALT of 42.  Basic metabolic panel was monitored two  days postoperatively. He did have hyperglycemia at 152 and 103. Creatinine  was slightly elevated at 1.7 on December 04, 2003. Calcium was slightly  decreased at 8.0 and 8.1 on December 04, 2003, and December 05, 2003. UA  preoperatively was negative. Blood type preoperative was type O positive,  antibody screen negative.  EKG from November 29th showed normal sinus  rhythm, no previous tracing, read by Dr. Fransico Him. X-rays from December 04, 2003, portable spine disease intraoperatively for localization. Also  postoperatively did confirm screw placement. Two views of the lumbar spine  were done on December 08, 2003, which showed intact fusion from L1 to L4,  prior L4-5 fusion present.   BRIEF HISTORY:  The patient is a 50 year old male who has been having  progressive problems with his back for a number of years. Unfortunately he  has failed conservative treatment, had increasing pain in the back, and  extending into bilateral lower extremities. He has had three previous back  surgeries and also tried epidural steroid injections, activity modification,  anti-inflammatory medications, and narcotic pain medication. Because of his  failure to improve with conservative treatment as well as MRI and x-ray  findings it was felt the best course of management would be to extend the  fusion up to L1. The risks and benefits of this were discussed with the  patient by Dr. Patrice Paradise as well as myself. He indicated understanding and opted  to proceed.   HOSPITAL COURSE:  On December 04, 2003, the patient was taken to the  operating room for the above-listed procedure. He tolerated the procedure  well without any intraoperative complications.   Postoperatively, routine  orthopedic spine protocol was followed. He  progressed along very well and did not develop any medical complications.   His hemoglobin did drop as previously dictated down to 9.0; however, he was  asymptomatic and did not require any treatment or transfusion.   On postoperative day two he was doing well. His Hemovac drain was pulled  without any difficulty. His dressing was changed. The incision looked good  without any signs or symptoms of infection. He had flatus, therefore his  diet was slowly advanced. He was running a low-grade temperature, but with  increased activity as well as incentive spirometry use; this has resolved on  its own. Over the next couple of days he made excellent progress with  physical therapy, getting to the point where he was independent, safe,  understood his back precautions as well as brace use. By December 08, 2003,  the patient had met all orthopedic goals, his pain was well controlled, he  was medically stable and ready for discharge.   DISCHARGE PLAN:  The patient is a 50 year old male status post posterior  spinal fusion, doing well.   ACTIVITY:  The patient should ambulate daily, progressive ambulation  program, brace on when he is up on his feet. Dressing changes daily. He  should keep the incision dry until the drainage has completely stopped. Back  precautions at all times. No lifting heavier than 5 pounds.   FOLLOWUP:  Two weeks postoperatively with Dr. Patrice Paradise.   DISCHARGE MEDICATIONS:  1.  Vicodin p.r.n. pain.  2.  Robaxin p.r.n. muscle spasm.  3.  Multivitamin daily.  4.  Calcium daily.  5.  Colace twice daily.  6.  Laxative if needed.  7.  Resume his home medications.   DIET:  Regular home diet as tolerated.   CONDITION ON DISCHARGE:  Stable and improved.   DISPOSITION:  The patient is being discharged to his home with the same  assistance as well as home health physical therapy and occupational therapy.      CM/MEDQ  D:  02/19/2004  T:  02/19/2004  Job:  [id number]

## 2010-10-15 ENCOUNTER — Ambulatory Visit: Payer: Self-pay | Admitting: Pulmonary Disease

## 2011-04-14 ENCOUNTER — Other Ambulatory Visit: Payer: Self-pay | Admitting: Orthopaedic Surgery

## 2011-04-14 DIAGNOSIS — M545 Low back pain: Secondary | ICD-10-CM

## 2011-04-17 ENCOUNTER — Ambulatory Visit
Admission: RE | Admit: 2011-04-17 | Discharge: 2011-04-17 | Disposition: A | Discharge status: Home / Self Care | Payer: Medicare Other | Source: Ambulatory Visit | Attending: Orthopaedic Surgery | Admitting: Orthopaedic Surgery

## 2011-04-17 DIAGNOSIS — M545 Low back pain: Secondary | ICD-10-CM

## 2012-03-06 ENCOUNTER — Other Ambulatory Visit: Payer: Self-pay | Admitting: Orthopaedic Surgery

## 2012-03-06 DIAGNOSIS — M542 Cervicalgia: Secondary | ICD-10-CM

## 2012-03-07 ENCOUNTER — Ambulatory Visit
Admission: RE | Admit: 2012-03-07 | Discharge: 2012-03-07 | Disposition: A | Discharge status: Home / Self Care | Payer: Medicare Other | Source: Ambulatory Visit | Attending: Orthopaedic Surgery | Admitting: Orthopaedic Surgery

## 2012-03-07 DIAGNOSIS — M542 Cervicalgia: Secondary | ICD-10-CM

## 2012-08-07 ENCOUNTER — Other Ambulatory Visit: Payer: Self-pay | Admitting: Orthopaedic Surgery

## 2012-08-07 DIAGNOSIS — M542 Cervicalgia: Secondary | ICD-10-CM

## 2012-08-09 ENCOUNTER — Ambulatory Visit (HOSPITAL_COMMUNITY)
Admission: RE | Admit: 2012-08-09 | Discharge: 2012-08-09 | Disposition: A | Discharge status: Home / Self Care | Payer: Medicare Other | Source: Ambulatory Visit | Attending: Orthopaedic Surgery | Admitting: Orthopaedic Surgery

## 2012-08-09 ENCOUNTER — Other Ambulatory Visit: Payer: Self-pay | Admitting: Radiology

## 2012-08-09 DIAGNOSIS — M5380 Other specified dorsopathies, site unspecified: Secondary | ICD-10-CM | POA: Insufficient documentation

## 2012-08-09 DIAGNOSIS — Z981 Arthrodesis status: Secondary | ICD-10-CM | POA: Insufficient documentation

## 2012-08-09 DIAGNOSIS — M542 Cervicalgia: Secondary | ICD-10-CM

## 2012-08-09 DIAGNOSIS — M25519 Pain in unspecified shoulder: Secondary | ICD-10-CM | POA: Insufficient documentation

## 2012-08-09 DIAGNOSIS — M5020 Other cervical disc displacement, unspecified cervical region: Secondary | ICD-10-CM | POA: Insufficient documentation

## 2012-08-09 LAB — GLUCOSE, CAPILLARY: Glucose-Capillary: 102 mg/dL — ABNORMAL HIGH (ref 70–99)

## 2012-08-09 MED ORDER — ACETAMINOPHEN 500 MG PO TABS
1000.0000 mg | ORAL_TABLET | Freq: Four times a day (QID) | ORAL | Status: DC | PRN
  Filled 2012-08-09: qty 2

## 2012-08-09 MED ORDER — DIAZEPAM 5 MG PO TABS
10.0000 mg | ORAL_TABLET | Freq: Once | ORAL | Status: AC
  Administered 2012-08-09: 10 mg via ORAL

## 2012-08-09 MED ORDER — ACETAMINOPHEN 325 MG PO TABS
ORAL_TABLET | ORAL | Status: AC
  Administered 2012-08-09: 1000 mg via ORAL
  Filled 2012-08-09: qty 3

## 2012-08-09 MED ORDER — DIAZEPAM 5 MG PO TABS
ORAL_TABLET | ORAL | Status: AC
  Administered 2012-08-09: 10 mg via ORAL
  Filled 2012-08-09: qty 2

## 2012-08-09 MED ORDER — IOHEXOL 300 MG/ML  SOLN
10.0000 mL | Freq: Once | INTRAMUSCULAR | Status: AC | PRN
  Administered 2012-08-09: 7 mL via INTRATHECAL

## 2012-08-09 NOTE — Procedures (Signed)
Cervical myelogram was arranged and intiitially attempted by Dr. Carlis Abbott.  After unsuccessful attempts, request by patient and Dr. Carlis Abbott for additional physician attempt.  Procedure and associated risks (including but not limited to; headache (possibly requiring blood patch), bleeding, infection, nerve damage, contrast reaction unsuccessful procedure) were discussed with the patient.  Questions answered.  Time out performed.  Patient re prepped and draped in sterile fashion.  L4-5 LP attempted with 22g spinal needle however, needle deflected by scar tissue and bone.  20g L4-5 LP performed.  Needle deflected to left lateral aspect of sac near nerve root.  Blood tinged CSF.  7 cc Omnipaque 300 gently instilled questioning patient about any left side discomfort.  At the end of procedure, the patient denied left sided leg discomfort.  Contrast negotiated into cervical region without difficulty.  CT cervical spine to follow.  Post procedure instructions reviewed with patient.

## 2012-08-11 ENCOUNTER — Ambulatory Visit (HOSPITAL_COMMUNITY): Payer: Medicare Other

## 2012-08-11 ENCOUNTER — Inpatient Hospital Stay (HOSPITAL_COMMUNITY): Admission: RE | Admit: 2012-08-11 | Payer: Self-pay | Source: Ambulatory Visit

## 2012-08-18 ENCOUNTER — Other Ambulatory Visit (HOSPITAL_COMMUNITY): Payer: Medicare Other

## 2013-10-31 ENCOUNTER — Other Ambulatory Visit (HOSPITAL_BASED_OUTPATIENT_CLINIC_OR_DEPARTMENT_OTHER): Payer: Self-pay | Admitting: Orthopaedic Surgery

## 2013-10-31 DIAGNOSIS — M4712 Other spondylosis with myelopathy, cervical region: Secondary | ICD-10-CM

## 2013-11-03 ENCOUNTER — Other Ambulatory Visit (HOSPITAL_BASED_OUTPATIENT_CLINIC_OR_DEPARTMENT_OTHER): Payer: Medicare Other

## 2014-11-25 ENCOUNTER — Other Ambulatory Visit (HOSPITAL_COMMUNITY): Payer: Self-pay | Admitting: Orthopaedic Surgery

## 2014-11-25 ENCOUNTER — Ambulatory Visit (HOSPITAL_COMMUNITY)
Admission: RE | Admit: 2014-11-25 | Discharge: 2014-11-25 | Disposition: A | Discharge status: Home / Self Care | Payer: Medicare Other | Source: Ambulatory Visit | Attending: Orthopaedic Surgery | Admitting: Orthopaedic Surgery

## 2014-11-25 DIAGNOSIS — M960 Pseudarthrosis after fusion or arthrodesis: Secondary | ICD-10-CM

## 2014-11-25 DIAGNOSIS — M4807 Spinal stenosis, lumbosacral region: Secondary | ICD-10-CM | POA: Diagnosis not present

## 2014-11-25 DIAGNOSIS — M1288 Other specific arthropathies, not elsewhere classified, other specified site: Secondary | ICD-10-CM | POA: Diagnosis not present

## 2014-11-25 DIAGNOSIS — M545 Low back pain: Secondary | ICD-10-CM | POA: Insufficient documentation

## 2015-11-21 ENCOUNTER — Other Ambulatory Visit: Payer: Self-pay | Admitting: Orthopaedic Surgery

## 2015-11-21 DIAGNOSIS — M4322 Fusion of spine, cervical region: Secondary | ICD-10-CM

## 2015-12-07 ENCOUNTER — Other Ambulatory Visit: Payer: Medicare Other

## 2015-12-07 ENCOUNTER — Ambulatory Visit
Admission: RE | Admit: 2015-12-07 | Discharge: 2015-12-07 | Disposition: A | Discharge status: Home / Self Care | Payer: Medicare Other | Source: Ambulatory Visit | Attending: Orthopaedic Surgery | Admitting: Orthopaedic Surgery

## 2015-12-07 DIAGNOSIS — M4322 Fusion of spine, cervical region: Secondary | ICD-10-CM

## 2017-03-28 ENCOUNTER — Other Ambulatory Visit: Payer: Self-pay | Admitting: Orthopaedic Surgery

## 2017-03-28 DIAGNOSIS — M542 Cervicalgia: Secondary | ICD-10-CM

## 2017-04-06 ENCOUNTER — Ambulatory Visit
Admission: RE | Admit: 2017-04-06 | Discharge: 2017-04-06 | Disposition: A | Discharge status: Home / Self Care | Payer: Medicare PPO | Source: Ambulatory Visit | Attending: Orthopaedic Surgery | Admitting: Orthopaedic Surgery

## 2017-04-06 DIAGNOSIS — M542 Cervicalgia: Secondary | ICD-10-CM

## 2017-09-12 ENCOUNTER — Other Ambulatory Visit: Payer: Self-pay | Admitting: Orthopaedic Surgery

## 2017-09-12 DIAGNOSIS — M25561 Pain in right knee: Secondary | ICD-10-CM

## 2017-09-21 ENCOUNTER — Other Ambulatory Visit: Payer: Medicare PPO

## 2017-10-05 ENCOUNTER — Ambulatory Visit
Admission: RE | Admit: 2017-10-05 | Discharge: 2017-10-05 | Disposition: A | Discharge status: Home / Self Care | Payer: Medicare PPO | Source: Ambulatory Visit | Attending: Orthopaedic Surgery | Admitting: Orthopaedic Surgery

## 2017-10-05 DIAGNOSIS — M25561 Pain in right knee: Secondary | ICD-10-CM

## 2017-12-27 ENCOUNTER — Other Ambulatory Visit: Payer: Self-pay | Admitting: Orthopaedic Surgery

## 2017-12-27 DIAGNOSIS — M542 Cervicalgia: Secondary | ICD-10-CM

## 2018-01-05 ENCOUNTER — Ambulatory Visit
Admission: RE | Admit: 2018-01-05 | Discharge: 2018-01-05 | Disposition: A | Discharge status: Home / Self Care | Payer: Medicare Other | Source: Ambulatory Visit | Attending: Orthopaedic Surgery | Admitting: Orthopaedic Surgery

## 2018-01-05 DIAGNOSIS — M542 Cervicalgia: Secondary | ICD-10-CM

## 2018-11-16 ENCOUNTER — Other Ambulatory Visit: Payer: Self-pay | Admitting: Orthopedic Surgery

## 2018-11-16 DIAGNOSIS — M25511 Pain in right shoulder: Secondary | ICD-10-CM

## 2018-11-29 ENCOUNTER — Other Ambulatory Visit: Payer: Self-pay

## 2018-11-29 ENCOUNTER — Ambulatory Visit
Admission: RE | Admit: 2018-11-29 | Discharge: 2018-11-29 | Disposition: A | Discharge status: Home / Self Care | Payer: Medicare Other | Source: Ambulatory Visit | Attending: Orthopedic Surgery | Admitting: Orthopedic Surgery

## 2018-11-29 DIAGNOSIS — M25511 Pain in right shoulder: Secondary | ICD-10-CM

## 2018-12-03 ENCOUNTER — Other Ambulatory Visit: Payer: Medicare Other

## 2018-12-05 ENCOUNTER — Other Ambulatory Visit: Payer: Medicare Other

## 2019-01-01 ENCOUNTER — Other Ambulatory Visit: Payer: Self-pay | Admitting: Orthopedic Surgery

## 2019-01-01 DIAGNOSIS — M25511 Pain in right shoulder: Secondary | ICD-10-CM

## 2019-01-02 ENCOUNTER — Other Ambulatory Visit: Payer: Medicare Other

## 2019-01-08 ENCOUNTER — Other Ambulatory Visit: Payer: Self-pay | Admitting: Orthopedic Surgery

## 2019-01-08 DIAGNOSIS — M25511 Pain in right shoulder: Secondary | ICD-10-CM

## 2019-01-11 ENCOUNTER — Other Ambulatory Visit: Payer: Self-pay

## 2019-01-11 ENCOUNTER — Ambulatory Visit
Admission: RE | Admit: 2019-01-11 | Discharge: 2019-01-11 | Disposition: A | Discharge status: Home / Self Care | Payer: Medicare Other | Source: Ambulatory Visit | Attending: Orthopedic Surgery | Admitting: Orthopedic Surgery

## 2019-01-11 ENCOUNTER — Other Ambulatory Visit: Payer: Medicare Other

## 2019-01-11 DIAGNOSIS — M25511 Pain in right shoulder: Secondary | ICD-10-CM

## 2019-01-11 MED ORDER — IOPAMIDOL (ISOVUE-M 200) INJECTION 41%
13.0000 mL | Freq: Once | INTRAMUSCULAR | Status: AC
  Administered 2019-01-11: 17:00:00 13 mL via INTRA_ARTICULAR

## 2019-01-15 DIAGNOSIS — M25511 Pain in right shoulder: Secondary | ICD-10-CM | POA: Diagnosis not present

## 2019-01-19 DIAGNOSIS — Z6841 Body Mass Index (BMI) 40.0 and over, adult: Secondary | ICD-10-CM | POA: Diagnosis not present

## 2019-01-19 DIAGNOSIS — Z0000 Encounter for general adult medical examination without abnormal findings: Secondary | ICD-10-CM | POA: Diagnosis not present

## 2019-01-19 DIAGNOSIS — R7989 Other specified abnormal findings of blood chemistry: Secondary | ICD-10-CM | POA: Diagnosis not present

## 2019-01-19 DIAGNOSIS — G4733 Obstructive sleep apnea (adult) (pediatric): Secondary | ICD-10-CM | POA: Diagnosis not present

## 2019-01-19 DIAGNOSIS — E1121 Type 2 diabetes mellitus with diabetic nephropathy: Secondary | ICD-10-CM | POA: Diagnosis not present

## 2019-01-19 DIAGNOSIS — I10 Essential (primary) hypertension: Secondary | ICD-10-CM | POA: Diagnosis not present

## 2019-01-19 DIAGNOSIS — M1000 Idiopathic gout, unspecified site: Secondary | ICD-10-CM | POA: Diagnosis not present

## 2019-01-19 DIAGNOSIS — G894 Chronic pain syndrome: Secondary | ICD-10-CM | POA: Diagnosis not present

## 2019-01-19 DIAGNOSIS — E785 Hyperlipidemia, unspecified: Secondary | ICD-10-CM | POA: Diagnosis not present

## 2019-01-19 DIAGNOSIS — N1831 Chronic kidney disease, stage 3a: Secondary | ICD-10-CM | POA: Diagnosis not present

## 2019-01-19 DIAGNOSIS — Z7984 Long term (current) use of oral hypoglycemic drugs: Secondary | ICD-10-CM | POA: Diagnosis not present

## 2019-01-19 DIAGNOSIS — I129 Hypertensive chronic kidney disease with stage 1 through stage 4 chronic kidney disease, or unspecified chronic kidney disease: Secondary | ICD-10-CM | POA: Diagnosis not present

## 2019-01-19 DIAGNOSIS — E1122 Type 2 diabetes mellitus with diabetic chronic kidney disease: Secondary | ICD-10-CM | POA: Diagnosis not present

## 2019-01-24 DIAGNOSIS — N289 Disorder of kidney and ureter, unspecified: Secondary | ICD-10-CM | POA: Diagnosis not present

## 2019-01-24 DIAGNOSIS — I10 Essential (primary) hypertension: Secondary | ICD-10-CM | POA: Diagnosis not present

## 2019-01-24 DIAGNOSIS — M542 Cervicalgia: Secondary | ICD-10-CM | POA: Diagnosis not present

## 2019-01-24 DIAGNOSIS — N1831 Chronic kidney disease, stage 3a: Secondary | ICD-10-CM | POA: Diagnosis not present

## 2019-01-24 DIAGNOSIS — Z6839 Body mass index (BMI) 39.0-39.9, adult: Secondary | ICD-10-CM | POA: Diagnosis not present

## 2019-01-24 DIAGNOSIS — G894 Chronic pain syndrome: Secondary | ICD-10-CM | POA: Diagnosis not present

## 2019-01-26 DIAGNOSIS — G4733 Obstructive sleep apnea (adult) (pediatric): Secondary | ICD-10-CM | POA: Diagnosis not present

## 2019-02-02 DIAGNOSIS — N189 Chronic kidney disease, unspecified: Secondary | ICD-10-CM | POA: Diagnosis not present

## 2019-02-02 DIAGNOSIS — N281 Cyst of kidney, acquired: Secondary | ICD-10-CM | POA: Diagnosis not present

## 2019-02-02 DIAGNOSIS — N1831 Chronic kidney disease, stage 3a: Secondary | ICD-10-CM | POA: Diagnosis not present

## 2019-02-06 DIAGNOSIS — G4733 Obstructive sleep apnea (adult) (pediatric): Secondary | ICD-10-CM | POA: Diagnosis not present

## 2019-02-06 DIAGNOSIS — Z6841 Body Mass Index (BMI) 40.0 and over, adult: Secondary | ICD-10-CM | POA: Diagnosis not present

## 2019-02-06 DIAGNOSIS — M75101 Unspecified rotator cuff tear or rupture of right shoulder, not specified as traumatic: Secondary | ICD-10-CM | POA: Diagnosis not present

## 2019-02-06 DIAGNOSIS — I10 Essential (primary) hypertension: Secondary | ICD-10-CM | POA: Diagnosis not present

## 2019-02-12 DIAGNOSIS — Z6841 Body Mass Index (BMI) 40.0 and over, adult: Secondary | ICD-10-CM | POA: Diagnosis not present

## 2019-02-12 DIAGNOSIS — I10 Essential (primary) hypertension: Secondary | ICD-10-CM | POA: Diagnosis not present

## 2019-02-12 DIAGNOSIS — G4733 Obstructive sleep apnea (adult) (pediatric): Secondary | ICD-10-CM | POA: Diagnosis not present

## 2019-02-13 DIAGNOSIS — N1831 Chronic kidney disease, stage 3a: Secondary | ICD-10-CM | POA: Diagnosis not present

## 2019-02-15 DIAGNOSIS — H25813 Combined forms of age-related cataract, bilateral: Secondary | ICD-10-CM | POA: Diagnosis not present

## 2019-02-15 DIAGNOSIS — H35033 Hypertensive retinopathy, bilateral: Secondary | ICD-10-CM | POA: Diagnosis not present

## 2019-02-15 DIAGNOSIS — E119 Type 2 diabetes mellitus without complications: Secondary | ICD-10-CM | POA: Diagnosis not present

## 2019-02-15 DIAGNOSIS — H35362 Drusen (degenerative) of macula, left eye: Secondary | ICD-10-CM | POA: Diagnosis not present

## 2019-02-21 DIAGNOSIS — M25512 Pain in left shoulder: Secondary | ICD-10-CM | POA: Diagnosis not present

## 2019-02-21 DIAGNOSIS — Z79891 Long term (current) use of opiate analgesic: Secondary | ICD-10-CM | POA: Diagnosis not present

## 2019-02-21 DIAGNOSIS — M75102 Unspecified rotator cuff tear or rupture of left shoulder, not specified as traumatic: Secondary | ICD-10-CM | POA: Diagnosis not present

## 2019-02-21 DIAGNOSIS — M47892 Other spondylosis, cervical region: Secondary | ICD-10-CM | POA: Diagnosis not present

## 2019-02-21 DIAGNOSIS — Z79899 Other long term (current) drug therapy: Secondary | ICD-10-CM | POA: Diagnosis not present

## 2019-02-21 DIAGNOSIS — G894 Chronic pain syndrome: Secondary | ICD-10-CM | POA: Diagnosis not present

## 2019-02-26 DIAGNOSIS — G4733 Obstructive sleep apnea (adult) (pediatric): Secondary | ICD-10-CM | POA: Diagnosis not present

## 2019-02-26 DIAGNOSIS — R635 Abnormal weight gain: Secondary | ICD-10-CM | POA: Diagnosis not present

## 2019-02-26 DIAGNOSIS — E291 Testicular hypofunction: Secondary | ICD-10-CM | POA: Diagnosis not present

## 2019-02-27 ENCOUNTER — Ambulatory Visit: Payer: Self-pay | Admitting: Physician Assistant

## 2019-02-27 NOTE — H&P (View-Only) (Signed)
Peter Bryan is an 59 y.o. male.   Chief Complaint: right shoulder pain HPI: He has a well defined full thickness tear of the supraspinatus. Probable biceps tendon rupture. Severe AC joint arthritis.  He is definitely weak. Positive empty can. Significant impingement findings.  Severe tenderness and pain over the [facility] joint.   Past Medical History:  Diagnosis Date  . Obesity   . Sleep apnea     PMH: sleep apnea, HTN, multiple spine surgeries, chronic pain, diabetes, high cholesterol  Family Hx: diabetes, HTN,  Social History: nonsmoker, nondrinker, married, unemployed  Allergies:  Allergies  Allergen Reactions  . Cephalexin Itching and Rash    (Not in a hospital admission)   No results found for this or any previous visit (from the past 48 hour(s)). No results found.  Review of Systems  HENT: Positive for tinnitus.   Musculoskeletal: Positive for arthralgias.  All other systems reviewed and are negative.   There were no vitals taken for this visit. Physical Exam  Constitutional: He is oriented to person, place, and time. He appears well-developed and well-nourished. No distress.  HENT:  Head: Normocephalic and atraumatic.  Eyes: Pupils are equal, round, and reactive to light. Conjunctivae are normal.  Cardiovascular: Normal rate and intact distal pulses.  Respiratory: Effort normal. No respiratory distress.  GI: Soft. He exhibits no distension.  Musculoskeletal:     Right shoulder: Tenderness, bony tenderness and pain present. Decreased range of motion. Decreased strength.     Cervical back: Normal range of motion and neck supple.  Neurological: He is alert and oriented to person, place, and time.  Skin: Skin is warm and dry. No rash noted. No erythema.  Psychiatric: He has a normal mood and affect. His behavior is normal.     Assessment/Plan Discussed arthroscopy, acromioplasty and distal clavicle  Open rotator cuff repair.    I told him it is a little bit  difficult to make a decision relative to the definite repair ability off the CT but based on my reading, and I went over this in the office with several of the other doctors, the consensus in the office, based on his pain, disability an attempted arthroscopy with hopeful repair would be in order. It would be premature to discuss a reverse shoulder arthroplasty.  He has sleep apnea but he is treated  on a chronic basis for that. He has been compliant for years.  He does have cervical fusion and lumbar fusion and he may not be a candidate for a block.  He is on narcotic pain medication through Dr. Towanda Malkin office.  We will need to check in with that as well.  The risks and benefits of the above mentioned procedure, arthroscopy, acromioplasty, distal clavicle rotator cuff repair with Arthrex materials was discussed with the patient. Proceed on with scheduling based on clearance through the medical system as well as anesthesia.  Optimally, the patient and I would both like for Dr. Towanda Malkin office to continue to handle his narcotic requirements. Obviously we would have to predicate that on their agreement.   Chriss Czar, PA-C 02/27/2019, 3:26 PM

## 2019-02-27 NOTE — H&P (Signed)
Peter Bryan is an 59 y.o. male.   Chief Complaint: right shoulder pain HPI: He has a well defined full thickness tear of the supraspinatus. Probable biceps tendon rupture. Severe AC joint arthritis.  He is definitely weak. Positive empty can. Significant impingement findings.  Severe tenderness and pain over the [facility] joint.   Past Medical History:  Diagnosis Date  . Obesity   . Sleep apnea     PMH: sleep apnea, HTN, multiple spine surgeries, chronic pain, diabetes, high cholesterol  Family Hx: diabetes, HTN,  Social History: nonsmoker, nondrinker, married, unemployed  Allergies:  Allergies  Allergen Reactions  . Cephalexin Itching and Rash    (Not in a hospital admission)   No results found for this or any previous visit (from the past 48 hour(s)). No results found.  Review of Systems  HENT: Positive for tinnitus.   Musculoskeletal: Positive for arthralgias.  All other systems reviewed and are negative.   There were no vitals taken for this visit. Physical Exam  Constitutional: He is oriented to person, place, and time. He appears well-developed and well-nourished. No distress.  HENT:  Head: Normocephalic and atraumatic.  Eyes: Pupils are equal, round, and reactive to light. Conjunctivae are normal.  Cardiovascular: Normal rate and intact distal pulses.  Respiratory: Effort normal. No respiratory distress.  GI: Soft. He exhibits no distension.  Musculoskeletal:     Right shoulder: Tenderness, bony tenderness and pain present. Decreased range of motion. Decreased strength.     Cervical back: Normal range of motion and neck supple.  Neurological: He is alert and oriented to person, place, and time.  Skin: Skin is warm and dry. No rash noted. No erythema.  Psychiatric: He has a normal mood and affect. His behavior is normal.     Assessment/Plan Discussed arthroscopy, acromioplasty and distal clavicle  Open rotator cuff repair.    I told him it is a little bit  difficult to make a decision relative to the definite repair ability off the CT but based on my reading, and I went over this in the office with several of the other doctors, the consensus in the office, based on his pain, disability an attempted arthroscopy with hopeful repair would be in order. It would be premature to discuss a reverse shoulder arthroplasty.  He has sleep apnea but he is treated  on a chronic basis for that. He has been compliant for years.  He does have cervical fusion and lumbar fusion and he may not be a candidate for a block.  He is on narcotic pain medication through Dr. Towanda Malkin office.  We will need to check in with that as well.  The risks and benefits of the above mentioned procedure, arthroscopy, acromioplasty, distal clavicle rotator cuff repair with Arthrex materials was discussed with the patient. Proceed on with scheduling based on clearance through the medical system as well as anesthesia.  Optimally, the patient and I would both like for Dr. Towanda Malkin office to continue to handle his narcotic requirements. Obviously we would have to predicate that on their agreement.   Chriss Czar, PA-C 02/27/2019, 3:26 PM

## 2019-03-06 NOTE — Patient Instructions (Signed)
DUE TO COVID-19 ONLY ONE VISITOR IS ALLOWED TO COME WITH YOU AND STAY IN THE WAITING ROOM ONLY DURING PRE OP AND PROCEDURE DAY OF SURGERY. THE 1 VISITOR MAY VISIT WITH YOU AFTER SURGERY IN YOUR PRIVATE ROOM DURING VISITING HOURS ONLY!  YOU NEED TO HAVE A COVID 19 TEST ON_3/9/21______ @_10 :05______, THIS TEST MUST BE DONE BEFORE SURGERY, COME  [place] , [address].  ([place]) ONCE YOUR COVID TEST IS COMPLETED, PLEASE BEGIN THE QUARANTINE INSTRUCTIONS AS OUTLINED IN YOUR HANDOUT.                Peter Bryan    Your procedure is scheduled on: 03/16/19   Report to [facility] Main  Entrance   Report to Short Stay at 5:30 AM     Call this number if you have problems the morning of surgery [place], NO CHEWING GUM [place].   Do not eat food After Midnight.   YOU MAY HAVE CLEAR LIQUIDS FROM MIDNIGHT UNTIL 4:30AM  . At 4:30AM Please finish the prescribed Pre-Surgery Gatorade drink.   Nothing by mouth after you finish the Gatorade drink !   Take these medicines the morning of surgery with A SIP OF WATER: GABAPENTIN, AMLODIPINE,ALLOPURINOL,SEVELLA  DO NOT TAKE ANY DIABETIC MEDICATIONS DAY OF YOUR SURGERY   How to Manage Your Diabetes Before and After Surgery  Why is it important to control my blood sugar before and after surgery? . Improving blood sugar levels before and after surgery helps healing and can limit problems. . A way of improving blood sugar control is eating a healthy diet by: o  Eating less sugar and carbohydrates o  Increasing activity/exercise o  Talking with your doctor about reaching your blood sugar goals . High blood sugars (greater than 180 mg/dL) can raise your risk of infections and slow your recovery, so you will need to focus on controlling your diabetes during the weeks before surgery. . Make sure that the doctor who takes care of your  diabetes knows about your planned surgery including the date and location.  How do I manage my blood sugar before surgery? . Check your blood sugar at least 4 times a day, starting 2 days before surgery, to make sure that the level is not too high or low. o Check your blood sugar the morning of your surgery when you wake up and every 2 hours until you get to the Short Stay unit. . If your blood sugar is less than 70 mg/dL, you will need to treat for low blood sugar: o Do not take insulin. o Treat a low blood sugar (less than 70 mg/dL) with  cup of clear juice (cranberry or apple), 4 glucose tablets, OR glucose gel. o Recheck blood sugar in 15 minutes after treatment (to make sure it is greater than 70 mg/dL). If your blood sugar is not greater than 70 mg/dL on recheck, call [phone] for further instructions. . Report your blood sugar to the short stay nurse when you get to Short Stay.  . If you are admitted to the hospital after surgery: o Your blood sugar will be checked by the staff and you will probably be given insulin after surgery (instead of oral diabetes medicines) to make sure you have good blood sugar levels. o The goal for blood sugar control after surgery is 80-180 mg/dL.   WHAT DO  I DO ABOUT MY DIABETES MEDICATION?  Marland Kitchen Do not take oral diabetes medicines (pills) the morning of surgery.  .                                You may not have any metal on your body including              piercings  Do not wear jewelry, lotions, powders or  deodorant                       Men may shave face and neck.   Do not bring valuables to the hospital. [place].  Contacts, dentures or bridgework may not be worn into surgery.       Special Instructions: N/A              Please read over the following fact sheets you were given: _____________________________________________________________________             [facility] -  Preparing for Surgery  Before surgery, you can play an important role.   Because skin is not sterile, your skin needs to be as free of germs as possible.   You can reduce the number of germs on your skin by washing with CHG (chlorahexidine gluconate) soap before surgery .  CHG is an antiseptic cleaner which kills germs and bonds with the skin to continue killing germs even after washing. Please DO NOT use if you have an allergy to CHG or antibacterial soaps.   If your skin becomes reddened/irritated stop using the CHG and inform your nurse when you arrive at Short Stay.   You may shave your face/neck.  Please follow these instructions carefully:  1.  Shower with CHG Soap the night before surgery and the  morning of Surgery.  2.  If you choose to wash your hair, wash your hair first as usual with your  normal  shampoo.  3.  After you shampoo, rinse your hair and body thoroughly to remove the  shampoo.                                         4.  Use CHG as you would any other liquid soap.  You can apply chg directly  to the skin and wash                       Gently with a scrungie or clean washcloth.  5.  Apply the CHG Soap to your body ONLY FROM THE NECK DOWN.   Do not use on face/ open                           Wound or open sores. Avoid contact with eyes, ears mouth and genitals (private parts).                       Wash face,  Genitals (private parts) with your normal soap.             6.  Wash thoroughly, paying special attention to the area where your surgery  will be performed.  7.  Thoroughly rinse your body with warm water from the neck down.  8.  DO NOT shower/wash with your normal soap after using and rinsing off  the CHG Soap.             9.  Pat yourself dry with a clean towel.            10.  Wear clean pajamas.            11.  Place clean sheets on your bed the night of your first shower and do not  sleep with pets. Day of Surgery : Do not apply any lotions/deodorants the  morning of surgery.  Please wear clean clothes to the hospital/surgery center.  FAILURE TO FOLLOW THESE INSTRUCTIONS MAY RESULT IN THE CANCELLATION OF YOUR SURGERY PATIENT SIGNATURE_________________________________  NURSE SIGNATURE__________________________________  ________________________________________________________________________   Adam Phenix  An incentive spirometer is a tool that can help keep your lungs clear and active. This tool measures how well you are filling your lungs with each breath. Taking long deep breaths may help reverse or decrease the chance of developing breathing (pulmonary) problems (especially infection) following:  A long period of time when you are unable to move or be active. BEFORE THE PROCEDURE   If the spirometer includes an indicator to show your best effort, your nurse or respiratory therapist will set it to a desired goal.  If possible, sit up straight or lean slightly forward. Try not to slouch.  Hold the incentive spirometer in an upright position. INSTRUCTIONS FOR USE  1. Sit on the edge of your bed if possible, or sit up as far as you can in bed or on a chair. 2. Hold the incentive spirometer in an upright position. 3. Breathe out normally. 4. Place the mouthpiece in your mouth and seal your lips tightly around it. 5. Breathe in slowly and as deeply as possible, raising the piston or the ball toward the top of the column. 6. Hold your breath for 3-5 seconds or for as long as possible. Allow the piston or ball to fall to the bottom of the column. 7. Remove the mouthpiece from your mouth and breathe out normally. 8. Rest for a few seconds and repeat Steps 1 through 7 at least 10 times every 1-2 hours when you are awake. Take your time and take a few normal breaths between deep breaths. 9. The spirometer may include an indicator to show your best effort. Use the indicator as a goal to work toward during each repetition. 10. After each  set of 10 deep breaths, practice coughing to be sure your lungs are clear. If you have an incision (the cut made at the time of surgery), support your incision when coughing by placing a pillow or rolled up towels firmly against it. Once you are able to get out of bed, walk around indoors and cough well. You may stop using the incentive spirometer when instructed by your caregiver.  RISKS AND COMPLICATIONS  Take your time so you do not get dizzy or light-headed.  If you are in pain, you may need to take or ask for pain medication before doing incentive spirometry. It is harder to take a deep breath if you are having pain. AFTER USE  Rest and breathe slowly and easily.  It can be helpful to keep track of a log of your progress. Your caregiver can provide you with a simple table to help with this. If you are using the spirometer at home,  follow these instructions: [place] IF:   You are having difficultly using the spirometer.  You have trouble using the spirometer as often as instructed.  Your pain medication is not giving enough relief while using the spirometer.  You develop fever of 100.5 F (38.1 C) or higher. SEEK IMMEDIATE MEDICAL CARE IF:   You cough up bloody sputum that had not been present before.  You develop fever of 102 F (38.9 C) or greater.  You develop worsening pain at or near the incision site. MAKE SURE YOU:   Understand these instructions.  Will watch your condition.  Will get help right away if you are not doing well or get worse. Document Released: 05/03/2006 Document Revised: 03/15/2011 Document Reviewed: 07/04/2006 [facility] Patient Information 2014 [place], [place].   ________________________________________________________________________

## 2019-03-07 ENCOUNTER — Encounter (HOSPITAL_COMMUNITY): Payer: Self-pay

## 2019-03-07 ENCOUNTER — Other Ambulatory Visit: Payer: Self-pay

## 2019-03-07 ENCOUNTER — Encounter (HOSPITAL_COMMUNITY)
Admission: RE | Admit: 2019-03-07 | Discharge: 2019-03-07 | Disposition: A | Discharge status: Home / Self Care | Payer: Medicare HMO | Source: Ambulatory Visit | Attending: Orthopedic Surgery | Admitting: Orthopedic Surgery

## 2019-03-07 DIAGNOSIS — E1122 Type 2 diabetes mellitus with diabetic chronic kidney disease: Secondary | ICD-10-CM | POA: Diagnosis not present

## 2019-03-07 DIAGNOSIS — Z7984 Long term (current) use of oral hypoglycemic drugs: Secondary | ICD-10-CM | POA: Insufficient documentation

## 2019-03-07 DIAGNOSIS — N189 Chronic kidney disease, unspecified: Secondary | ICD-10-CM | POA: Insufficient documentation

## 2019-03-07 DIAGNOSIS — E1140 Type 2 diabetes mellitus with diabetic neuropathy, unspecified: Secondary | ICD-10-CM | POA: Insufficient documentation

## 2019-03-07 DIAGNOSIS — Z981 Arthrodesis status: Secondary | ICD-10-CM | POA: Insufficient documentation

## 2019-03-07 DIAGNOSIS — X58XXXA Exposure to other specified factors, initial encounter: Secondary | ICD-10-CM | POA: Diagnosis not present

## 2019-03-07 DIAGNOSIS — M75101 Unspecified rotator cuff tear or rupture of right shoulder, not specified as traumatic: Secondary | ICD-10-CM | POA: Diagnosis not present

## 2019-03-07 DIAGNOSIS — Z79899 Other long term (current) drug therapy: Secondary | ICD-10-CM | POA: Insufficient documentation

## 2019-03-07 DIAGNOSIS — G4733 Obstructive sleep apnea (adult) (pediatric): Secondary | ICD-10-CM | POA: Diagnosis not present

## 2019-03-07 DIAGNOSIS — Z01818 Encounter for other preprocedural examination: Secondary | ICD-10-CM | POA: Diagnosis not present

## 2019-03-07 DIAGNOSIS — I129 Hypertensive chronic kidney disease with stage 1 through stage 4 chronic kidney disease, or unspecified chronic kidney disease: Secondary | ICD-10-CM | POA: Insufficient documentation

## 2019-03-07 HISTORY — DX: Myoneural disorder, unspecified: G70.9

## 2019-03-07 HISTORY — DX: Essential (primary) hypertension: I10

## 2019-03-07 HISTORY — DX: Chronic kidney disease, unspecified: N18.9

## 2019-03-07 HISTORY — DX: Type 2 diabetes mellitus without complications: E11.9

## 2019-03-07 LAB — BASIC METABOLIC PANEL
Anion gap: 8 (ref 5–15)
BUN: 27 mg/dL — ABNORMAL HIGH (ref 8–23)
CO2: 25 mmol/L (ref 22–32)
Calcium: 9.8 mg/dL (ref 8.9–10.3)
Chloride: 103 mmol/L (ref 98–111)
Creatinine, Ser: 1.63 mg/dL — ABNORMAL HIGH (ref 0.61–1.24)
GFR calc Af Amer: 52 mL/min — ABNORMAL LOW (ref >60)
GFR calc non Af Amer: 45 mL/min — ABNORMAL LOW (ref >60)
Glucose, Bld: 84 mg/dL (ref 70–99)
Potassium: 5 mmol/L (ref 3.5–5.1)
Sodium: 136 mmol/L (ref 135–145)

## 2019-03-07 LAB — CBC
HCT: 42.7 % (ref 39.0–52.0)
Hemoglobin: 13.5 g/dL (ref 13.0–17.0)
MCH: 30.1 pg (ref 26.0–34.0)
MCHC: 31.6 g/dL (ref 30.0–36.0)
MCV: 95.1 fL (ref 80.0–100.0)
Platelets: 202 10*3/uL (ref 150–400)
RBC: 4.49 MIL/uL (ref 4.22–5.81)
RDW: 13.4 % (ref 11.5–15.5)
WBC: 11.9 10*3/uL — ABNORMAL HIGH (ref 4.0–10.5)
nRBC: 0 % (ref 0.0–0.2)

## 2019-03-07 LAB — GLUCOSE, CAPILLARY: Glucose-Capillary: 78 mg/dL (ref 70–99)

## 2019-03-07 LAB — HEMOGLOBIN A1C
Hgb A1c MFr Bld: 6.8 % — ABNORMAL HIGH (ref 4.8–5.6)
Mean Plasma Glucose: 148.46 mg/dL

## 2019-03-07 NOTE — Progress Notes (Signed)
PCP - Dr. Beatrix Fetters Cardiologist - no  Chest x-ray - no EKG - 02/26/19 Stress Test - no ECHO - no Cardiac Cath - no  Sleep Study - Yes CPAP -  Yes and bi-pap  Fasting Blood Sugar - 125/165 Checks Blood Sugar _TID____ times a day  Blood Thinner Instructions:NA Aspirin Instructions: Last Dose:  Anesthesia review:   Patient denies shortness of breath, fever, cough and chest pain at PAT appointment yes  Patient verbalized understanding of instructions that were given to them at the PAT appointment. Patient was also instructed that they will need to review over the PAT instructions again at home before surgery. yes

## 2019-03-08 DIAGNOSIS — E669 Obesity, unspecified: Secondary | ICD-10-CM | POA: Diagnosis not present

## 2019-03-08 DIAGNOSIS — G4733 Obstructive sleep apnea (adult) (pediatric): Secondary | ICD-10-CM | POA: Diagnosis not present

## 2019-03-08 DIAGNOSIS — Z9989 Dependence on other enabling machines and devices: Secondary | ICD-10-CM | POA: Diagnosis not present

## 2019-03-08 DIAGNOSIS — E1121 Type 2 diabetes mellitus with diabetic nephropathy: Secondary | ICD-10-CM | POA: Diagnosis not present

## 2019-03-08 DIAGNOSIS — N1831 Chronic kidney disease, stage 3a: Secondary | ICD-10-CM | POA: Diagnosis not present

## 2019-03-08 DIAGNOSIS — Z6841 Body Mass Index (BMI) 40.0 and over, adult: Secondary | ICD-10-CM | POA: Diagnosis not present

## 2019-03-08 DIAGNOSIS — I129 Hypertensive chronic kidney disease with stage 1 through stage 4 chronic kidney disease, or unspecified chronic kidney disease: Secondary | ICD-10-CM | POA: Diagnosis not present

## 2019-03-08 DIAGNOSIS — E785 Hyperlipidemia, unspecified: Secondary | ICD-10-CM | POA: Diagnosis not present

## 2019-03-08 NOTE — Progress Notes (Signed)
Case: [id number] Date/Time: 03/16/19 0715   Procedure: SHOULDER ARTHROSCOPY WITH OPEN ROTATOR CUFF REPAIR AND DISTAL CLAVICLE ACROMINECTOMY (Right )   Anesthesia type: Choice   Pre-op diagnosis: RIGHT SHOULDER ROTATOR CUFF TEAR   Location: [facility] ROOM 05 / [facility]   Surgeons: Earlie Server, MD      DISCUSSION:59 y.o. never smoker with h/o HTN, sleep apnea, DM II, CKD, right shoulder rotator cuff tear scheduled for above procedure 03/16/19 With Dr. Earlie Server.   S/p C3-5 ACDF, posterior fusion C5-T1 with hardware in place.  S/p T10-S1 fusion with hardware in place.   Seen by pulmonologist, Dr. Camillo Flaming, 02/12/19.  Per OV note, "The patient is otherwise cleared from a pulmonary/sleep standpoint to undergo rotator cuff surgery"  Anticipate pt can proceed with planned procedure barring acute status change.   VS: There were no vitals taken for this visit.  PROVIDERS: Loraine Leriche., MD is PCP last seen 02/06/2019  Francena Hanly, MD is Pulmonologist  LABS: Labs reviewed: Acceptable for surgery. (all labs ordered are listed, but only abnormal results are displayed)  Labs Reviewed  BASIC METABOLIC PANEL - Abnormal; Notable for the following components:      Result Value   BUN 27 (*)    Creatinine, Ser 1.63 (*)    GFR calc non Af Amer 45 (*)    GFR calc Af Amer 52 (*)    All other components within normal limits  CBC - Abnormal; Notable for the following components:   WBC 11.9 (*)    All other components within normal limits  HEMOGLOBIN A1C - Abnormal; Notable for the following components:   Hgb A1c MFr Bld 6.8 (*)    All other components within normal limits     IMAGES:   EKG:   CV:  Past Medical History:  Diagnosis Date  . Chronic kidney disease   . Diabetes mellitus without complication ([place])   . Hypertension   . Neuromuscular disorder (HCC)    neuropathy feet  . Obesity   . Sleep apnea     Past Surgical History:  Procedure Laterality Date  . BACK SURGERY   1/92-08/2016   multiple levels. C5-S1    MEDICATIONS: . allopurinol (ZYLOPRIM) 300 MG tablet  . amLODipine (NORVASC) 5 MG tablet  . baclofen (LIORESAL) 10 MG tablet  . Cholecalciferol (QC VITAMIN D3) 50 MCG (2000 UT) TABS  . gabapentin (NEURONTIN) 300 MG capsule  . HYDROcodone-acetaminophen (NORCO) 10-325 MG per tablet  . rosuvastatin (CRESTOR) 5 MG tablet  . SAVELLA 50 MG TABS tablet  . sitaGLIPtin (JANUVIA) 25 MG tablet  . tadalafil (CIALIS) 10 MG tablet  . testosterone cypionate (DEPOTESTOSTERONE CYPIONATE) 200 MG/ML injection  . valsartan-hydrochlorothiazide (DIOVAN-HCT) 320-25 MG tablet  . zolpidem (AMBIEN) 10 MG tablet   No current facility-administered medications for this encounter.     Maia Plan [facility] Pre-Surgical Testing [phone] 03/08/19  2:10 PM

## 2019-03-13 ENCOUNTER — Other Ambulatory Visit (HOSPITAL_COMMUNITY)
Admission: RE | Admit: 2019-03-13 | Discharge: 2019-03-13 | Disposition: A | Discharge status: Home / Self Care | Payer: Medicare HMO | Source: Ambulatory Visit | Attending: Orthopedic Surgery | Admitting: Orthopedic Surgery

## 2019-03-13 DIAGNOSIS — E1121 Type 2 diabetes mellitus with diabetic nephropathy: Secondary | ICD-10-CM | POA: Diagnosis not present

## 2019-03-13 DIAGNOSIS — I129 Hypertensive chronic kidney disease with stage 1 through stage 4 chronic kidney disease, or unspecified chronic kidney disease: Secondary | ICD-10-CM | POA: Diagnosis not present

## 2019-03-13 DIAGNOSIS — N1831 Chronic kidney disease, stage 3a: Secondary | ICD-10-CM | POA: Diagnosis not present

## 2019-03-13 DIAGNOSIS — M75101 Unspecified rotator cuff tear or rupture of right shoulder, not specified as traumatic: Secondary | ICD-10-CM | POA: Diagnosis not present

## 2019-03-13 DIAGNOSIS — Z20822 Contact with and (suspected) exposure to covid-19: Secondary | ICD-10-CM | POA: Insufficient documentation

## 2019-03-13 DIAGNOSIS — Z01812 Encounter for preprocedural laboratory examination: Secondary | ICD-10-CM | POA: Insufficient documentation

## 2019-03-13 DIAGNOSIS — Z6841 Body Mass Index (BMI) 40.0 and over, adult: Secondary | ICD-10-CM | POA: Diagnosis not present

## 2019-03-13 DIAGNOSIS — E1140 Type 2 diabetes mellitus with diabetic neuropathy, unspecified: Secondary | ICD-10-CM | POA: Diagnosis not present

## 2019-03-13 LAB — SARS CORONAVIRUS 2 (TAT 6-24 HRS): SARS Coronavirus 2: NEGATIVE

## 2019-03-15 NOTE — Anesthesia Preprocedure Evaluation (Addendum)
Anesthesia Evaluation  Patient identified by MRN, date of birth, ID band Patient awake    Reviewed: Allergy & Precautions, NPO status , Patient's Chart, lab work & pertinent test results  History of Anesthesia Complications Negative for: history of anesthetic complications  Airway Mallampati: II  TM Distance: >3 FB Neck ROM: Full    Dental  (+) Missing,    Pulmonary sleep apnea and Continuous Positive Airway Pressure Ventilation ,    Pulmonary exam normal        Cardiovascular hypertension, Pt. on medications Normal cardiovascular exam     Neuro/Psych S/p C3-5 ACDF, posterior fusion C5-T1 with hardware in place.  S/p T10-S1 fusion with hardware in place negative psych ROS   GI/Hepatic negative GI ROS, Neg liver ROS,   Endo/Other  diabetes, Type 2, Oral Hypoglycemic AgentsMorbid obesity  Renal/GU Renal InsufficiencyRenal disease  negative genitourinary   Musculoskeletal negative musculoskeletal ROS (+)   Abdominal   Peds  Hematology negative hematology ROS (+)   Anesthesia Other Findings Day of surgery medications reviewed with patient.  Reproductive/Obstetrics negative OB ROS                            Anesthesia Physical Anesthesia Plan  ASA: III  Anesthesia Plan: General   Post-op Pain Management: GA combined w/ Regional for post-op pain   Induction: Intravenous  PONV Risk Score and Plan: 2 and Treatment may vary due to age or medical condition, Ondansetron, Dexamethasone and Midazolam  Airway Management Planned: Oral ETT and Video Laryngoscope Planned  Additional Equipment: None  Intra-op Plan:   Post-operative Plan: Extubation in OR  Informed Consent: I have reviewed the patients History and Physical, chart, labs and discussed the procedure including the risks, benefits and alternatives for the proposed anesthesia with the patient or authorized representative who has  indicated his/her understanding and acceptance.     Dental advisory given  Plan Discussed with: CRNA  Anesthesia Plan Comments:        Anesthesia Quick Evaluation

## 2019-03-16 ENCOUNTER — Encounter (HOSPITAL_COMMUNITY)
Admission: RE | Disposition: A | Discharge status: Home / Self Care | Payer: Self-pay | Source: Home / Self Care | Attending: Orthopedic Surgery

## 2019-03-16 ENCOUNTER — Ambulatory Visit (HOSPITAL_COMMUNITY): Payer: Medicare HMO | Admitting: Anesthesiology

## 2019-03-16 ENCOUNTER — Encounter (HOSPITAL_COMMUNITY): Payer: Self-pay | Admitting: Orthopedic Surgery

## 2019-03-16 ENCOUNTER — Ambulatory Visit (HOSPITAL_COMMUNITY)
Admission: RE | Admit: 2019-03-16 | Discharge: 2019-03-16 | Disposition: A | Discharge status: Home / Self Care | Payer: Medicare HMO | Attending: Orthopedic Surgery | Admitting: Orthopedic Surgery

## 2019-03-16 ENCOUNTER — Ambulatory Visit (HOSPITAL_COMMUNITY): Payer: Medicare HMO | Admitting: Physician Assistant

## 2019-03-16 DIAGNOSIS — X58XXXA Exposure to other specified factors, initial encounter: Secondary | ICD-10-CM | POA: Insufficient documentation

## 2019-03-16 DIAGNOSIS — M24111 Other articular cartilage disorders, right shoulder: Secondary | ICD-10-CM | POA: Diagnosis not present

## 2019-03-16 DIAGNOSIS — E7800 Pure hypercholesterolemia, unspecified: Secondary | ICD-10-CM | POA: Insufficient documentation

## 2019-03-16 DIAGNOSIS — E119 Type 2 diabetes mellitus without complications: Secondary | ICD-10-CM | POA: Insufficient documentation

## 2019-03-16 DIAGNOSIS — G4730 Sleep apnea, unspecified: Secondary | ICD-10-CM | POA: Insufficient documentation

## 2019-03-16 DIAGNOSIS — I10 Essential (primary) hypertension: Secondary | ICD-10-CM | POA: Insufficient documentation

## 2019-03-16 DIAGNOSIS — E669 Obesity, unspecified: Secondary | ICD-10-CM | POA: Diagnosis not present

## 2019-03-16 DIAGNOSIS — Z833 Family history of diabetes mellitus: Secondary | ICD-10-CM | POA: Insufficient documentation

## 2019-03-16 DIAGNOSIS — Z7984 Long term (current) use of oral hypoglycemic drugs: Secondary | ICD-10-CM | POA: Diagnosis not present

## 2019-03-16 DIAGNOSIS — M75121 Complete rotator cuff tear or rupture of right shoulder, not specified as traumatic: Secondary | ICD-10-CM | POA: Insufficient documentation

## 2019-03-16 DIAGNOSIS — Z8249 Family history of ischemic heart disease and other diseases of the circulatory system: Secondary | ICD-10-CM | POA: Insufficient documentation

## 2019-03-16 DIAGNOSIS — Z881 Allergy status to other antibiotic agents status: Secondary | ICD-10-CM | POA: Insufficient documentation

## 2019-03-16 DIAGNOSIS — M19011 Primary osteoarthritis, right shoulder: Secondary | ICD-10-CM | POA: Diagnosis not present

## 2019-03-16 DIAGNOSIS — Z6841 Body Mass Index (BMI) 40.0 and over, adult: Secondary | ICD-10-CM | POA: Diagnosis not present

## 2019-03-16 DIAGNOSIS — E1122 Type 2 diabetes mellitus with diabetic chronic kidney disease: Secondary | ICD-10-CM | POA: Diagnosis not present

## 2019-03-16 DIAGNOSIS — I129 Hypertensive chronic kidney disease with stage 1 through stage 4 chronic kidney disease, or unspecified chronic kidney disease: Secondary | ICD-10-CM | POA: Diagnosis not present

## 2019-03-16 DIAGNOSIS — S43431A Superior glenoid labrum lesion of right shoulder, initial encounter: Secondary | ICD-10-CM | POA: Insufficient documentation

## 2019-03-16 DIAGNOSIS — N189 Chronic kidney disease, unspecified: Secondary | ICD-10-CM | POA: Diagnosis not present

## 2019-03-16 DIAGNOSIS — M75101 Unspecified rotator cuff tear or rupture of right shoulder, not specified as traumatic: Secondary | ICD-10-CM | POA: Diagnosis not present

## 2019-03-16 DIAGNOSIS — G8929 Other chronic pain: Secondary | ICD-10-CM | POA: Insufficient documentation

## 2019-03-16 DIAGNOSIS — G8918 Other acute postprocedural pain: Secondary | ICD-10-CM | POA: Diagnosis not present

## 2019-03-16 HISTORY — PX: SHOULDER ARTHROSCOPY WITH OPEN ROTATOR CUFF REPAIR AND DISTAL CLAVICLE ACROMINECTOMY: SHX5683

## 2019-03-16 LAB — GLUCOSE, CAPILLARY
Glucose-Capillary: 109 mg/dL — ABNORMAL HIGH (ref 70–99)
Glucose-Capillary: 142 mg/dL — ABNORMAL HIGH (ref 70–99)

## 2019-03-16 SURGERY — SHOULDER ARTHROSCOPY WITH OPEN ROTATOR CUFF REPAIR AND DISTAL CLAVICLE ACROMINECTOMY
Anesthesia: General | Site: Shoulder | Laterality: Right

## 2019-03-16 MED ORDER — PROPOFOL 10 MG/ML IV BOLUS
INTRAVENOUS | Status: AC
  Filled 2019-03-16: qty 20

## 2019-03-16 MED ORDER — LACTATED RINGERS IV SOLN: INTRAVENOUS | Status: DC

## 2019-03-16 MED ORDER — FENTANYL CITRATE (PF) 100 MCG/2ML IJ SOLN
INTRAMUSCULAR | Status: AC
  Filled 2019-03-16: qty 2

## 2019-03-16 MED ORDER — PROMETHAZINE HCL 25 MG/ML IJ SOLN: 6.2500 mg | INTRAMUSCULAR | Status: DC | PRN

## 2019-03-16 MED ORDER — SUGAMMADEX SODIUM 200 MG/2ML IV SOLN
INTRAVENOUS | Status: DC | PRN
  Administered 2019-03-16: 200 mg via INTRAVENOUS

## 2019-03-16 MED ORDER — CLINDAMYCIN PHOSPHATE 900 MG/50ML IV SOLN
900.0000 mg | INTRAVENOUS | Status: AC
  Administered 2019-03-16: 900 mg via INTRAVENOUS
  Filled 2019-03-16: qty 50

## 2019-03-16 MED ORDER — DEXAMETHASONE SODIUM PHOSPHATE 10 MG/ML IJ SOLN
INTRAMUSCULAR | Status: AC
  Filled 2019-03-16: qty 1

## 2019-03-16 MED ORDER — ONDANSETRON HCL 4 MG/2ML IJ SOLN
INTRAMUSCULAR | Status: DC | PRN
  Administered 2019-03-16: 4 mg via INTRAVENOUS

## 2019-03-16 MED ORDER — HYDROCODONE-ACETAMINOPHEN 10-325 MG PO TABS: 2.0000 | ORAL_TABLET | ORAL | Status: DC

## 2019-03-16 MED ORDER — PROPOFOL 10 MG/ML IV BOLUS
INTRAVENOUS | Status: DC | PRN
  Administered 2019-03-16: 200 mg via INTRAVENOUS

## 2019-03-16 MED ORDER — OXYCODONE HCL 5 MG PO TABS: 5.0000 mg | ORAL_TABLET | Freq: Once | ORAL | Status: DC | PRN

## 2019-03-16 MED ORDER — HYDROMORPHONE HCL 1 MG/ML IJ SOLN
0.2500 mg | INTRAMUSCULAR | Status: DC | PRN
  Administered 2019-03-16 (×3): 0.5 mg via INTRAVENOUS

## 2019-03-16 MED ORDER — OXYCODONE HCL 5 MG/5ML PO SOLN: 5.0000 mg | Freq: Once | ORAL | Status: DC | PRN

## 2019-03-16 MED ORDER — FENTANYL CITRATE (PF) 100 MCG/2ML IJ SOLN
INTRAMUSCULAR | Status: DC | PRN
  Administered 2019-03-16 (×4): 50 ug via INTRAVENOUS

## 2019-03-16 MED ORDER — HYDROMORPHONE HCL 1 MG/ML IJ SOLN
INTRAMUSCULAR | Status: AC
  Filled 2019-03-16: qty 1

## 2019-03-16 MED ORDER — MIDAZOLAM HCL 2 MG/2ML IJ SOLN
INTRAMUSCULAR | Status: AC
  Filled 2019-03-16: qty 2

## 2019-03-16 MED ORDER — ONDANSETRON HCL 4 MG/2ML IJ SOLN
INTRAMUSCULAR | Status: AC
  Filled 2019-03-16: qty 2

## 2019-03-16 MED ORDER — 0.9 % SODIUM CHLORIDE (POUR BTL) OPTIME
TOPICAL | Status: DC | PRN
  Administered 2019-03-16: 1000 mL

## 2019-03-16 MED ORDER — FENTANYL CITRATE (PF) 100 MCG/2ML IJ SOLN
25.0000 ug | INTRAMUSCULAR | Status: DC | PRN
  Administered 2019-03-16 (×3): 50 ug via INTRAVENOUS

## 2019-03-16 MED ORDER — SODIUM CHLORIDE 0.9 % IV SOLN: INTRAVENOUS | Status: DC

## 2019-03-16 MED ORDER — LIDOCAINE 2% (20 MG/ML) 5 ML SYRINGE
INTRAMUSCULAR | Status: AC
  Filled 2019-03-16: qty 5

## 2019-03-16 MED ORDER — HYDROCODONE-ACETAMINOPHEN 10-325 MG PO TABS: 1.0000 | ORAL_TABLET | Freq: Four times a day (QID) | ORAL | Status: DC | PRN

## 2019-03-16 MED ORDER — BUPIVACAINE LIPOSOME 1.3 % IJ SUSP
INTRAMUSCULAR | Status: DC | PRN
  Administered 2019-03-16: 10 mL via PERINEURAL

## 2019-03-16 MED ORDER — SUCCINYLCHOLINE CHLORIDE 200 MG/10ML IV SOSY
PREFILLED_SYRINGE | INTRAVENOUS | Status: DC | PRN
  Administered 2019-03-16: 200 mg via INTRAVENOUS

## 2019-03-16 MED ORDER — DEXAMETHASONE SODIUM PHOSPHATE 10 MG/ML IJ SOLN
INTRAMUSCULAR | Status: DC | PRN
  Administered 2019-03-16: 10 mg via INTRAVENOUS

## 2019-03-16 MED ORDER — CHLORHEXIDINE GLUCONATE 4 % EX LIQD: 60.0000 mL | Freq: Once | CUTANEOUS | Status: DC

## 2019-03-16 MED ORDER — LIDOCAINE 2% (20 MG/ML) 5 ML SYRINGE
INTRAMUSCULAR | Status: DC | PRN
  Administered 2019-03-16: 100 mg via INTRAVENOUS

## 2019-03-16 MED ORDER — BUPIVACAINE-EPINEPHRINE (PF) 0.5% -1:200000 IJ SOLN
INTRAMUSCULAR | Status: DC | PRN
  Administered 2019-03-16: 15 mL via PERINEURAL

## 2019-03-16 MED ORDER — FENTANYL CITRATE (PF) 250 MCG/5ML IJ SOLN
INTRAMUSCULAR | Status: AC
  Filled 2019-03-16: qty 5

## 2019-03-16 MED ORDER — MIDAZOLAM HCL 5 MG/5ML IJ SOLN
INTRAMUSCULAR | Status: DC | PRN
  Administered 2019-03-16: 2 mg via INTRAVENOUS

## 2019-03-16 MED ORDER — ROCURONIUM BROMIDE 10 MG/ML (PF) SYRINGE
PREFILLED_SYRINGE | INTRAVENOUS | Status: AC
  Filled 2019-03-16: qty 10

## 2019-03-16 MED ORDER — ROCURONIUM BROMIDE 10 MG/ML (PF) SYRINGE
PREFILLED_SYRINGE | INTRAVENOUS | Status: DC | PRN
  Administered 2019-03-16: 50 mg via INTRAVENOUS
  Administered 2019-03-16: 10 mg via INTRAVENOUS
  Administered 2019-03-16: 20 mg via INTRAVENOUS

## 2019-03-16 MED ORDER — SODIUM CHLORIDE 0.9 % IR SOLN
Status: DC | PRN
  Administered 2019-03-16: 3000 mL

## 2019-03-16 MED ORDER — ACETAMINOPHEN 500 MG PO TABS
1000.0000 mg | ORAL_TABLET | Freq: Once | ORAL | Status: AC
  Administered 2019-03-16: 06:00:00 1000 mg via ORAL
  Filled 2019-03-16: qty 2

## 2019-03-16 MED ORDER — PHENYLEPHRINE HCL (PRESSORS) 10 MG/ML IV SOLN
INTRAVENOUS | Status: AC
  Filled 2019-03-16: qty 1

## 2019-03-16 SURGICAL SUPPLY — 67 items
AID PSTN UNV HD RSTRNT DISP (MISCELLANEOUS) ×1
APL SKNCLS STERI-STRIP NONHPOA (GAUZE/BANDAGES/DRESSINGS) ×1
BENZOIN TINCTURE PRP APPL 2/3 (GAUZE/BANDAGES/DRESSINGS) ×2 IMPLANT
BLADE AVERAGE 25MMX9MM (BLADE) ×1
BLADE AVERAGE 25X9 (BLADE) ×1 IMPLANT
BLADE SURG SZ11 CARB STEEL (BLADE) ×3 IMPLANT
BUR OVAL CARBIDE 4.0 (BURR) ×2 IMPLANT
BURR OVAL 8 FLU 5.0MM X 13CM (MISCELLANEOUS)
BURR OVAL 8 FLU 5.0X13 (MISCELLANEOUS) IMPLANT
CANNULA 5.75X7 CRYSTAL CLEAR (CANNULA) ×2 IMPLANT
CLOSURE STERI-STRIP 1/2X4 (GAUZE/BANDAGES/DRESSINGS) ×1
CLOSURE WOUND 1/2 X4 (GAUZE/BANDAGES/DRESSINGS) ×1
CLSR STERI-STRIP ANTIMIC 1/2X4 (GAUZE/BANDAGES/DRESSINGS) ×1 IMPLANT
COVER SURGICAL LIGHT HANDLE (MISCELLANEOUS) ×3 IMPLANT
COVER WAND RF STERILE (DRAPES) IMPLANT
DECANTER SPIKE VIAL GLASS SM (MISCELLANEOUS) ×1 IMPLANT
DISSECTOR 4.0MM X 13CM (MISCELLANEOUS) ×2 IMPLANT
DRAPE SHOULDER BEACH CHAIR (DRAPES) ×1 IMPLANT
DRAPE STERI 35X30 U-POUCH (DRAPES) ×2 IMPLANT
DRAPE SURG 17X11 SM STRL (DRAPES) ×1 IMPLANT
DRAPE U-SHAPE 47X51 STRL (DRAPES) ×1 IMPLANT
DRSG EMULSION OIL 3X3 NADH (GAUZE/BANDAGES/DRESSINGS) ×3 IMPLANT
DRSG PAD ABDOMINAL 8X10 ST (GAUZE/BANDAGES/DRESSINGS) ×2 IMPLANT
EXCALIBUR 3.8MM X 13CM (MISCELLANEOUS) ×2 IMPLANT
GAUZE SPONGE 4X4 12PLY STRL (GAUZE/BANDAGES/DRESSINGS) ×2 IMPLANT
GLOVE BIO SURGEON STRL SZ7.5 (GLOVE) ×5 IMPLANT
GLOVE BIOGEL PI IND STRL 8 (GLOVE) IMPLANT
GLOVE BIOGEL PI INDICATOR 8 (GLOVE) ×6
GLOVE SURG ORTHO 8.0 STRL STRW (GLOVE) ×5 IMPLANT
GOWN STRL REUS W/TWL XL LVL3 (GOWN DISPOSABLE) ×6 IMPLANT
KIT BASIN OR (CUSTOM PROCEDURE TRAY) IMPLANT
KIT POSITION SHOULDER SCHLEI (MISCELLANEOUS) ×1 IMPLANT
KIT TURNOVER KIT A (KITS) IMPLANT
MANIFOLD NEPTUNE II (INSTRUMENTS) ×4 IMPLANT
NDL HYPO 21X1.5 SAFETY (NEEDLE) ×1 IMPLANT
NDL SCORPION MULTI FIRE (NEEDLE) IMPLANT
NDL SPNL 18GX3.5 QUINCKE PK (NEEDLE) ×1 IMPLANT
NEEDLE HYPO 21X1.5 SAFETY (NEEDLE) ×3 IMPLANT
NEEDLE SCORPION MULTI FIRE (NEEDLE) ×3 IMPLANT
NEEDLE SPNL 18GX3.5 QUINCKE PK (NEEDLE) ×3 IMPLANT
PACK SHOULDER (CUSTOM PROCEDURE TRAY) ×3 IMPLANT
PENCIL SMOKE EVACUATOR (MISCELLANEOUS) IMPLANT
PORT APPOLLO RF 90DEGREE MULTI (SURGICAL WAND) ×2 IMPLANT
PROTECTOR NERVE ULNAR (MISCELLANEOUS) ×3 IMPLANT
RESTRAINT HEAD UNIVERSAL NS (MISCELLANEOUS) ×2 IMPLANT
SLING ARM IMMOBILIZER LRG (SOFTGOODS) IMPLANT
SLING ARM IMMOBILIZER MED (SOFTGOODS) IMPLANT
STRIP CLOSURE SKIN 1/2X4 (GAUZE/BANDAGES/DRESSINGS) ×1 IMPLANT
SUCTION FRAZIER HANDLE 12FR (TUBING) ×3
SUCTION TUBE FRAZIER 12FR DISP (TUBING) IMPLANT
SUT ETHIBOND NAB CT1 #1 30IN (SUTURE) ×4 IMPLANT
SUT ETHILON 4 0 PS 2 18 (SUTURE) ×3 IMPLANT
SUT FIBERWIRE #2 38 T-5 BLUE (SUTURE) ×3
SUT MNCRL AB 3-0 PS2 18 (SUTURE) ×2 IMPLANT
SUT TIGER TAPE 7 IN WHITE (SUTURE) ×4 IMPLANT
SUT VIC AB 0 CT1 27 (SUTURE)
SUT VIC AB 0 CT1 27XBRD ANTBC (SUTURE) IMPLANT
SUT VIC AB 1 CT1 36 (SUTURE) ×2 IMPLANT
SUT VIC AB 2-0 CT1 27 (SUTURE) ×3
SUT VIC AB 2-0 CT1 TAPERPNT 27 (SUTURE) IMPLANT
SUTURE FIBERWR #2 38 T-5 BLUE (SUTURE) IMPLANT
SYR 20ML LL LF (SYRINGE) ×3 IMPLANT
TAPE FIBER 2MM 7IN #2 BLUE (SUTURE) ×4 IMPLANT
TOWEL OR 17X26 10 PK STRL BLUE (TOWEL DISPOSABLE) ×6 IMPLANT
TUBING ARTHROSCOPY IRRIG 16FT (MISCELLANEOUS) ×3 IMPLANT
TUBING CONNECTING 10 (TUBING) ×2 IMPLANT
TUBING CONNECTING 10' (TUBING) ×1

## 2019-03-16 NOTE — Transfer of Care (Signed)
Immediate Anesthesia Transfer of Care Note  Patient: Peter Bryan  Procedure(s) Performed: Procedure(s): SHOULDER ARTHROSCOPY WITH OPEN ROTATOR CUFF REPAIR AND DISTAL CLAVICLE ACROMINECTOMY (Right)  Patient Location: PACU  Anesthesia Type:General  Level of Consciousness: Alert, Awake, Oriented  Airway & Oxygen Therapy: Patient Spontanous Breathing  Post-op Assessment: Report given to RN  Post vital signs: Reviewed and stable  Last Vitals:  Vitals:   03/16/19 0631 03/16/19 1021  BP: (!) 169/85 (!) 152/76  Pulse: 78 81  Resp: 18 19  Temp: 36.9 C 37.1 C  SpO2: 07% 22%    Complications: No apparent anesthesia complications

## 2019-03-16 NOTE — Discharge Instructions (Signed)
Diet: As you were doing prior to hospitalization   Activity: Increase activity slowly as tolerated  No lifting or driving for 6 weeks  Remain in sling except for shower (keep arm close to body)  Shower: May shower without a dressing on post op day #3, NO SOAKING in tub   Dressing: You may change your dressing on post op day #3.  Then change the dressing daily with sterile 4"x4"s gauze dressing  Leave steri strips in place pad dry after shower and apply new dressing, keep covered until follow up  Weight Bearing: nonweight bearing right arm.  To prevent constipation: you may use a stool softener such as -  Colace ( over the counter) 100 mg by mouth twice a day  Drink plenty of fluids ( prune juice may be helpful) and high fiber foods  Miralax ( over the counter) for constipation as needed.   Precautions: If you experience chest pain or shortness of breath - call 911 immediately For transfer to the hospital emergency department!!  If you develop a fever greater that 101 F, purulent drainage from wound, increased redness or drainage from wound, or calf pain -- Call the office   Follow- Up Appointment: Please call for an appointment to be seen in 1 week  [place] - [phone]

## 2019-03-16 NOTE — Brief Op Note (Signed)
03/16/2019  10:25 AM  PATIENT:  Arlester Marker  59 y.o. male  PRE-OPERATIVE DIAGNOSIS:  RIGHT SHOULDER ROTATOR CUFF TEAR  POST-OPERATIVE DIAGNOSIS:  RIGHT SHOULDER ROTATOR CUFF TEAR  PROCEDURE:  Procedure(s): SHOULDER ARTHROSCOPY WITH OPEN ROTATOR CUFF REPAIR AND DISTAL CLAVICLE ACROMINECTOMY (Right)  SURGEON:  Surgeon(s) and Role:    Earlie Server, MD - Primary  PHYSICIAN ASSISTANT: Chriss Czar, PA-C  ASSISTANTS:    ANESTHESIA:   regional and general  EBL:  200 mL   BLOOD ADMINISTERED:none  DRAINS: none   LOCAL MEDICATIONS USED:  NONE  SPECIMEN:  No Specimen  DISPOSITION OF SPECIMEN:  N/A  COUNTS:  YES  TOURNIQUET:  * No tourniquets in log *  DICTATION: .Other Dictation: Dictation Number unknown  PLAN OF CARE: Discharge to home after PACU  PATIENT DISPOSITION:  PACU - hemodynamically stable.   Delay start of Pharmacological VTE agent (>24hrs) due to surgical blood loss or risk of bleeding: not applicable

## 2019-03-16 NOTE — Op Note (Signed)
NAME: Peter Bryan, Peter Bryan MEDICAL RECORD [id number] ACCOUNT [id number] DATE OF BIRTH:30-Nov-1960 FACILITY[facility] LOCATION: [facility] PHYSICIAN:W. Noelene Gang JR., MD  OPERATIVE REPORT  DATE OF PROCEDURE:  03/16/2019  PREOPERATIVE DIAGNOSES: 1.  Massive rotator cuff tear with complete retracted supraspinatus tear, partial infraspinatus tear. 2.  Degenerative tearing anterior superior labrum. 3.  Impingement. 4.  Acromioclavicular arthritis.  OPERATION: 1.  Open rotator cuff repair, acromioplasty. 2.  Open distal clavicle. 3.  Arthroscopic debridement intraarticular torn labrum.  SURGEON:  Vangie Bicker, MD  ASSISTANT:  Jennet Maduro, MD   ESTIMATED BLOOD LOSS:  Minimal blood loss.  DESCRIPTION OF PROCEDURE:  Beach chair positioning, posterior and anterior portals created.  Systematic inspection intraarticularly with the arthroscope showed the patient to have extensive degenerative tearing of the anterior superior labrum.  The  biceps tendon could not be identified with degree of certainty.  There was severe retracted tear halfway over the humeral head with a 2nd interstitial tear of the supraspinatus somewhat of a band effect on the lateral aspect of the supraspinatus with a  partial tear of the infraspinatus.  Extensive intra-articular debridement was carried out.  We then elected to convert the procedure to an open procedure with an incision bisecting the acromial AC interval.  Severe AC arthritis was encountered and  resected about a 1 to 1.5 cm of the distal clavicle.  Very thick and hypertrophied acromion was noted.  We did an aggressive acromioplasty probably eventually resecting about 9-10 mm of the anterior edge of the acromion, which was causing severe  impingement contributing to the tear.  Bursectomy was carried out.  The cuff was extremely retracted.  The interstitial component relatively to the lateral band, which we thought still was a pretty significant piece of  tissue.  We oversewed that to  convert it into more of a simple pattern with #2 FiberWire.  We burred the tuberosity.  We had to bur into the articular cartilage slightly due to the fact this was severely retracted.  Extensive mobilization of the cuff was carried out, however.  We  then elected to fix this with a 4.5 SwiveLock BioComposite screw.  We did with a medial lateral row technique with a double armed SwiveLock on the medial row for a total of 8 FiberTapes.  We did use the additional sutures to buttress the medial repair.   We then did a lateral row with 2 SwiveLocks laterally to place the 8 FiberTapes.  Could not really do a completely anatomic repair, but given the severity of the retraction, felt like the repair was relatively stable, close to its native insertion site.   Wound was irrigated.  Closure of the deltoid, which has been split was with #1 Ethibond, subcutaneous tissues with 2-0 Vicryl and the skin with Monocryl.  Lightly compressive dressing with a mini pillow splint applied.  Taken to recovery room in stable  condition.  CN/NUANCE  D:03/16/2019 T:03/16/2019 JOB:[id number]

## 2019-03-16 NOTE — Anesthesia Postprocedure Evaluation (Signed)
Anesthesia Post Note  Patient: Peter Bryan  Procedure(s) Performed: SHOULDER ARTHROSCOPY WITH OPEN ROTATOR CUFF REPAIR AND DISTAL CLAVICLE ACROMINECTOMY (Right Shoulder)     Patient location during evaluation: PACU Anesthesia Type: General Level of consciousness: awake and alert and oriented Pain management: pain level controlled Vital Signs Assessment: post-procedure vital signs reviewed and stable Respiratory status: spontaneous breathing, nonlabored ventilation and respiratory function stable Cardiovascular status: blood pressure returned to baseline Postop Assessment: no apparent nausea or vomiting Anesthetic complications: no    Last Vitals:  Vitals:   03/16/19 1215 03/16/19 1220  BP: 135/62 (!) 152/83  Pulse: 83 82  Resp: 18 18  Temp: 36.8 C 37 C  SpO2: 93% 90%    Last Pain:  Vitals:   03/16/19 1220  TempSrc:   PainSc: [place]

## 2019-03-16 NOTE — Anesthesia Procedure Notes (Signed)
Procedure Name: Intubation Date/Time: 03/16/2019 7:53 AM Performed by: Gerald Leitz, CRNA Pre-anesthesia Checklist: Patient identified, Patient being monitored, Timeout performed, Emergency Drugs available and Suction available Patient Re-evaluated:Patient Re-evaluated prior to induction Oxygen Delivery Method: Circle system utilized Preoxygenation: Pre-oxygenation with 100% oxygen Induction Type: IV induction Ventilation: Mask ventilation without difficulty Laryngoscope Size: Mac and 3 Grade View: Grade I Tube type: Oral Tube size: 7.5 mm Number of attempts: 1 Airway Equipment and Method: Stylet and Video-laryngoscopy Placement Confirmation: ETT inserted through vocal cords under direct vision,  positive ETCO2 and breath sounds checked- equal and bilateral Secured at: 24 cm Tube secured with: Tape Dental Injury: Teeth and Oropharynx as per pre-operative assessment  Difficulty Due To: Difficult Airway- due to reduced neck mobility and Difficulty was anticipated

## 2019-03-16 NOTE — Anesthesia Procedure Notes (Signed)
Anesthesia Regional Block: Interscalene brachial plexus block   Pre-Anesthetic Checklist: ,, timeout performed, Correct Patient, Correct Site, Correct Laterality, Correct Procedure, Correct Position, site marked, Risks and benefits discussed, pre-op evaluation,  At surgeon's request and post-op pain management  Laterality: Right  Prep: Maximum Sterile Barrier Precautions used, chloraprep       Needles:  Injection technique: Single-shot  Needle Type: Echogenic Stimulator Needle     Needle Length: 4cm  Needle Gauge: 22     Additional Needles:   Procedures:,,,, ultrasound used (permanent image in chart),,,,  Narrative:  Start time: 03/16/2019 7:02 AM End time: 03/16/2019 7:05 AM Injection made incrementally with aspirations every 5 mL.  Performed by: Personally  Anesthesiologist: Brennan Bailey, MD  Additional Notes: Risks, benefits, and alternative discussed. Patient gave consent for procedure. Patient prepped and draped in sterile fashion. Sedation administered, patient remains easily responsive to voice. Relevant anatomy identified with ultrasound guidance. Local anesthetic given in 5cc increments with no signs or symptoms of intravascular injection. No pain or paraesthesias with injection. Patient monitored throughout procedure with signs of LAST or immediate complications. Tolerated well. Ultrasound image placed in chart.  Tawny Asal, MD

## 2019-03-16 NOTE — Interval H&P Note (Signed)
History and Physical Interval Note:  03/16/2019 7:39 AM  Peter Bryan  has presented today for surgery, with the diagnosis of [place].  The various methods of treatment have been discussed with the patient and family. After consideration of risks, benefits and other options for treatment, the patient has consented to  Procedure(s): SHOULDER ARTHROSCOPY WITH OPEN ROTATOR CUFF REPAIR AND DISTAL CLAVICLE ACROMINECTOMY (Right) as a surgical intervention.  The patient's history has been reviewed, patient examined, no change in status, stable for surgery.  I have reviewed the patient's chart and labs.  Questions were answered to the patient's satisfaction.     Yvette Rack

## 2019-03-22 DIAGNOSIS — M542 Cervicalgia: Secondary | ICD-10-CM | POA: Diagnosis not present

## 2019-03-22 DIAGNOSIS — Z79891 Long term (current) use of opiate analgesic: Secondary | ICD-10-CM | POA: Diagnosis not present

## 2019-03-22 DIAGNOSIS — M25511 Pain in right shoulder: Secondary | ICD-10-CM | POA: Diagnosis not present

## 2019-03-22 DIAGNOSIS — G894 Chronic pain syndrome: Secondary | ICD-10-CM | POA: Diagnosis not present

## 2019-03-22 DIAGNOSIS — M4322 Fusion of spine, cervical region: Secondary | ICD-10-CM | POA: Diagnosis not present

## 2019-03-26 DIAGNOSIS — G4733 Obstructive sleep apnea (adult) (pediatric): Secondary | ICD-10-CM | POA: Diagnosis not present

## 2019-03-28 DIAGNOSIS — M25511 Pain in right shoulder: Secondary | ICD-10-CM | POA: Diagnosis not present

## 2019-03-28 DIAGNOSIS — T8141XA Infection following a procedure, superficial incisional surgical site, initial encounter: Secondary | ICD-10-CM | POA: Diagnosis not present

## 2019-03-29 DIAGNOSIS — M25511 Pain in right shoulder: Secondary | ICD-10-CM | POA: Diagnosis not present

## 2019-03-29 DIAGNOSIS — T8141XD Infection following a procedure, superficial incisional surgical site, subsequent encounter: Secondary | ICD-10-CM | POA: Diagnosis not present

## 2019-03-29 DIAGNOSIS — Z1159 Encounter for screening for other viral diseases: Secondary | ICD-10-CM | POA: Diagnosis not present

## 2019-03-29 DIAGNOSIS — T8141XA Infection following a procedure, superficial incisional surgical site, initial encounter: Secondary | ICD-10-CM | POA: Diagnosis not present

## 2019-03-30 DIAGNOSIS — T8141XD Infection following a procedure, superficial incisional surgical site, subsequent encounter: Secondary | ICD-10-CM | POA: Diagnosis not present

## 2019-04-02 DIAGNOSIS — T8141XD Infection following a procedure, superficial incisional surgical site, subsequent encounter: Secondary | ICD-10-CM | POA: Diagnosis not present

## 2019-04-19 DIAGNOSIS — G894 Chronic pain syndrome: Secondary | ICD-10-CM | POA: Diagnosis not present

## 2019-04-19 DIAGNOSIS — Z79891 Long term (current) use of opiate analgesic: Secondary | ICD-10-CM | POA: Diagnosis not present

## 2019-04-19 DIAGNOSIS — M4322 Fusion of spine, cervical region: Secondary | ICD-10-CM | POA: Diagnosis not present

## 2019-04-19 DIAGNOSIS — T8141XD Infection following a procedure, superficial incisional surgical site, subsequent encounter: Secondary | ICD-10-CM | POA: Diagnosis not present

## 2019-04-19 DIAGNOSIS — M5136 Other intervertebral disc degeneration, lumbar region: Secondary | ICD-10-CM | POA: Diagnosis not present

## 2019-05-08 DIAGNOSIS — Z789 Other specified health status: Secondary | ICD-10-CM | POA: Diagnosis not present

## 2019-05-08 DIAGNOSIS — X58XXXA Exposure to other specified factors, initial encounter: Secondary | ICD-10-CM | POA: Diagnosis not present

## 2019-05-08 DIAGNOSIS — Z7409 Other reduced mobility: Secondary | ICD-10-CM | POA: Diagnosis not present

## 2019-05-08 DIAGNOSIS — R531 Weakness: Secondary | ICD-10-CM | POA: Diagnosis not present

## 2019-05-08 DIAGNOSIS — S46011A Strain of muscle(s) and tendon(s) of the rotator cuff of right shoulder, initial encounter: Secondary | ICD-10-CM | POA: Diagnosis not present

## 2019-05-14 DIAGNOSIS — X58XXXA Exposure to other specified factors, initial encounter: Secondary | ICD-10-CM | POA: Diagnosis not present

## 2019-05-14 DIAGNOSIS — R531 Weakness: Secondary | ICD-10-CM | POA: Diagnosis not present

## 2019-05-14 DIAGNOSIS — S46011A Strain of muscle(s) and tendon(s) of the rotator cuff of right shoulder, initial encounter: Secondary | ICD-10-CM | POA: Diagnosis not present

## 2019-05-14 DIAGNOSIS — Z789 Other specified health status: Secondary | ICD-10-CM | POA: Diagnosis not present

## 2019-05-14 DIAGNOSIS — Z7409 Other reduced mobility: Secondary | ICD-10-CM | POA: Diagnosis not present

## 2019-05-18 DIAGNOSIS — Z789 Other specified health status: Secondary | ICD-10-CM | POA: Diagnosis not present

## 2019-05-18 DIAGNOSIS — S46011A Strain of muscle(s) and tendon(s) of the rotator cuff of right shoulder, initial encounter: Secondary | ICD-10-CM | POA: Diagnosis not present

## 2019-05-18 DIAGNOSIS — X58XXXA Exposure to other specified factors, initial encounter: Secondary | ICD-10-CM | POA: Diagnosis not present

## 2019-05-18 DIAGNOSIS — R531 Weakness: Secondary | ICD-10-CM | POA: Diagnosis not present

## 2019-05-18 DIAGNOSIS — Z7409 Other reduced mobility: Secondary | ICD-10-CM | POA: Diagnosis not present

## 2019-05-21 DIAGNOSIS — M5136 Other intervertebral disc degeneration, lumbar region: Secondary | ICD-10-CM | POA: Diagnosis not present

## 2019-05-21 DIAGNOSIS — S46011A Strain of muscle(s) and tendon(s) of the rotator cuff of right shoulder, initial encounter: Secondary | ICD-10-CM | POA: Diagnosis not present

## 2019-05-21 DIAGNOSIS — Z789 Other specified health status: Secondary | ICD-10-CM | POA: Diagnosis not present

## 2019-05-21 DIAGNOSIS — G894 Chronic pain syndrome: Secondary | ICD-10-CM | POA: Diagnosis not present

## 2019-05-21 DIAGNOSIS — R531 Weakness: Secondary | ICD-10-CM | POA: Diagnosis not present

## 2019-05-21 DIAGNOSIS — M4322 Fusion of spine, cervical region: Secondary | ICD-10-CM | POA: Diagnosis not present

## 2019-05-21 DIAGNOSIS — Z79891 Long term (current) use of opiate analgesic: Secondary | ICD-10-CM | POA: Diagnosis not present

## 2019-05-21 DIAGNOSIS — X58XXXA Exposure to other specified factors, initial encounter: Secondary | ICD-10-CM | POA: Diagnosis not present

## 2019-05-21 DIAGNOSIS — Z7409 Other reduced mobility: Secondary | ICD-10-CM | POA: Diagnosis not present

## 2019-06-05 DIAGNOSIS — S46011D Strain of muscle(s) and tendon(s) of the rotator cuff of right shoulder, subsequent encounter: Secondary | ICD-10-CM | POA: Diagnosis not present

## 2019-06-05 DIAGNOSIS — Z789 Other specified health status: Secondary | ICD-10-CM | POA: Diagnosis not present

## 2019-06-05 DIAGNOSIS — X58XXXD Exposure to other specified factors, subsequent encounter: Secondary | ICD-10-CM | POA: Diagnosis not present

## 2019-06-05 DIAGNOSIS — Z7409 Other reduced mobility: Secondary | ICD-10-CM | POA: Diagnosis not present

## 2019-06-05 DIAGNOSIS — R531 Weakness: Secondary | ICD-10-CM | POA: Diagnosis not present

## 2019-06-05 DIAGNOSIS — T8141XD Infection following a procedure, superficial incisional surgical site, subsequent encounter: Secondary | ICD-10-CM | POA: Diagnosis not present

## 2019-06-07 DIAGNOSIS — Z789 Other specified health status: Secondary | ICD-10-CM | POA: Diagnosis not present

## 2019-06-07 DIAGNOSIS — X58XXXD Exposure to other specified factors, subsequent encounter: Secondary | ICD-10-CM | POA: Diagnosis not present

## 2019-06-07 DIAGNOSIS — R531 Weakness: Secondary | ICD-10-CM | POA: Diagnosis not present

## 2019-06-07 DIAGNOSIS — S46011D Strain of muscle(s) and tendon(s) of the rotator cuff of right shoulder, subsequent encounter: Secondary | ICD-10-CM | POA: Diagnosis not present

## 2019-06-07 DIAGNOSIS — Z7409 Other reduced mobility: Secondary | ICD-10-CM | POA: Diagnosis not present

## 2019-06-12 DIAGNOSIS — X58XXXD Exposure to other specified factors, subsequent encounter: Secondary | ICD-10-CM | POA: Diagnosis not present

## 2019-06-12 DIAGNOSIS — S46011D Strain of muscle(s) and tendon(s) of the rotator cuff of right shoulder, subsequent encounter: Secondary | ICD-10-CM | POA: Diagnosis not present

## 2019-06-12 DIAGNOSIS — Z7409 Other reduced mobility: Secondary | ICD-10-CM | POA: Diagnosis not present

## 2019-06-12 DIAGNOSIS — R531 Weakness: Secondary | ICD-10-CM | POA: Diagnosis not present

## 2019-06-12 DIAGNOSIS — Z789 Other specified health status: Secondary | ICD-10-CM | POA: Diagnosis not present

## 2019-06-15 DIAGNOSIS — Z7409 Other reduced mobility: Secondary | ICD-10-CM | POA: Diagnosis not present

## 2019-06-15 DIAGNOSIS — X58XXXD Exposure to other specified factors, subsequent encounter: Secondary | ICD-10-CM | POA: Diagnosis not present

## 2019-06-15 DIAGNOSIS — S46011D Strain of muscle(s) and tendon(s) of the rotator cuff of right shoulder, subsequent encounter: Secondary | ICD-10-CM | POA: Diagnosis not present

## 2019-06-15 DIAGNOSIS — Z789 Other specified health status: Secondary | ICD-10-CM | POA: Diagnosis not present

## 2019-06-15 DIAGNOSIS — R531 Weakness: Secondary | ICD-10-CM | POA: Diagnosis not present

## 2019-06-20 DIAGNOSIS — Z789 Other specified health status: Secondary | ICD-10-CM | POA: Diagnosis not present

## 2019-06-20 DIAGNOSIS — R531 Weakness: Secondary | ICD-10-CM | POA: Diagnosis not present

## 2019-06-20 DIAGNOSIS — S46011D Strain of muscle(s) and tendon(s) of the rotator cuff of right shoulder, subsequent encounter: Secondary | ICD-10-CM | POA: Diagnosis not present

## 2019-06-20 DIAGNOSIS — X58XXXD Exposure to other specified factors, subsequent encounter: Secondary | ICD-10-CM | POA: Diagnosis not present

## 2019-06-20 DIAGNOSIS — Z7409 Other reduced mobility: Secondary | ICD-10-CM | POA: Diagnosis not present

## 2019-06-22 DIAGNOSIS — Z789 Other specified health status: Secondary | ICD-10-CM | POA: Diagnosis not present

## 2019-06-22 DIAGNOSIS — X58XXXD Exposure to other specified factors, subsequent encounter: Secondary | ICD-10-CM | POA: Diagnosis not present

## 2019-06-22 DIAGNOSIS — R531 Weakness: Secondary | ICD-10-CM | POA: Diagnosis not present

## 2019-06-22 DIAGNOSIS — S46011D Strain of muscle(s) and tendon(s) of the rotator cuff of right shoulder, subsequent encounter: Secondary | ICD-10-CM | POA: Diagnosis not present

## 2019-06-22 DIAGNOSIS — Z7409 Other reduced mobility: Secondary | ICD-10-CM | POA: Diagnosis not present

## 2019-06-25 DIAGNOSIS — M4322 Fusion of spine, cervical region: Secondary | ICD-10-CM | POA: Diagnosis not present

## 2019-06-25 DIAGNOSIS — Z79891 Long term (current) use of opiate analgesic: Secondary | ICD-10-CM | POA: Diagnosis not present

## 2019-06-25 DIAGNOSIS — M5136 Other intervertebral disc degeneration, lumbar region: Secondary | ICD-10-CM | POA: Diagnosis not present

## 2019-06-25 DIAGNOSIS — B9689 Other specified bacterial agents as the cause of diseases classified elsewhere: Secondary | ICD-10-CM | POA: Diagnosis not present

## 2019-06-25 DIAGNOSIS — J329 Chronic sinusitis, unspecified: Secondary | ICD-10-CM | POA: Diagnosis not present

## 2019-06-25 DIAGNOSIS — H6123 Impacted cerumen, bilateral: Secondary | ICD-10-CM | POA: Diagnosis not present

## 2019-06-25 DIAGNOSIS — G894 Chronic pain syndrome: Secondary | ICD-10-CM | POA: Diagnosis not present

## 2019-06-28 DIAGNOSIS — Z7409 Other reduced mobility: Secondary | ICD-10-CM | POA: Diagnosis not present

## 2019-06-28 DIAGNOSIS — S46011D Strain of muscle(s) and tendon(s) of the rotator cuff of right shoulder, subsequent encounter: Secondary | ICD-10-CM | POA: Diagnosis not present

## 2019-06-28 DIAGNOSIS — X58XXXD Exposure to other specified factors, subsequent encounter: Secondary | ICD-10-CM | POA: Diagnosis not present

## 2019-06-28 DIAGNOSIS — R531 Weakness: Secondary | ICD-10-CM | POA: Diagnosis not present

## 2019-06-28 DIAGNOSIS — Z789 Other specified health status: Secondary | ICD-10-CM | POA: Diagnosis not present

## 2019-07-04 DIAGNOSIS — M542 Cervicalgia: Secondary | ICD-10-CM | POA: Diagnosis not present

## 2019-07-04 DIAGNOSIS — G5601 Carpal tunnel syndrome, right upper limb: Secondary | ICD-10-CM | POA: Diagnosis not present

## 2019-07-04 DIAGNOSIS — G894 Chronic pain syndrome: Secondary | ICD-10-CM | POA: Diagnosis not present

## 2019-07-04 DIAGNOSIS — M4322 Fusion of spine, cervical region: Secondary | ICD-10-CM | POA: Diagnosis not present

## 2019-07-10 DIAGNOSIS — R531 Weakness: Secondary | ICD-10-CM | POA: Diagnosis not present

## 2019-07-10 DIAGNOSIS — Z7409 Other reduced mobility: Secondary | ICD-10-CM | POA: Diagnosis not present

## 2019-07-10 DIAGNOSIS — Z789 Other specified health status: Secondary | ICD-10-CM | POA: Diagnosis not present

## 2019-07-10 DIAGNOSIS — S46011D Strain of muscle(s) and tendon(s) of the rotator cuff of right shoulder, subsequent encounter: Secondary | ICD-10-CM | POA: Diagnosis not present

## 2019-07-10 DIAGNOSIS — X58XXXD Exposure to other specified factors, subsequent encounter: Secondary | ICD-10-CM | POA: Diagnosis not present

## 2019-07-17 DIAGNOSIS — M4711 Other spondylosis with myelopathy, occipito-atlanto-axial region: Secondary | ICD-10-CM | POA: Diagnosis not present

## 2019-07-17 DIAGNOSIS — Z981 Arthrodesis status: Secondary | ICD-10-CM | POA: Diagnosis not present

## 2019-07-23 DIAGNOSIS — G894 Chronic pain syndrome: Secondary | ICD-10-CM | POA: Diagnosis not present

## 2019-07-23 DIAGNOSIS — G5601 Carpal tunnel syndrome, right upper limb: Secondary | ICD-10-CM | POA: Diagnosis not present

## 2019-07-23 DIAGNOSIS — M542 Cervicalgia: Secondary | ICD-10-CM | POA: Diagnosis not present

## 2019-07-23 DIAGNOSIS — M4322 Fusion of spine, cervical region: Secondary | ICD-10-CM | POA: Diagnosis not present

## 2019-08-02 DIAGNOSIS — Z4689 Encounter for fitting and adjustment of other specified devices: Secondary | ICD-10-CM | POA: Diagnosis not present

## 2019-08-02 DIAGNOSIS — G5601 Carpal tunnel syndrome, right upper limb: Secondary | ICD-10-CM | POA: Diagnosis not present

## 2019-08-02 DIAGNOSIS — M4322 Fusion of spine, cervical region: Secondary | ICD-10-CM | POA: Diagnosis not present

## 2019-08-02 DIAGNOSIS — M542 Cervicalgia: Secondary | ICD-10-CM | POA: Diagnosis not present

## 2019-08-02 DIAGNOSIS — G894 Chronic pain syndrome: Secondary | ICD-10-CM | POA: Diagnosis not present

## 2019-08-10 DIAGNOSIS — M1990 Unspecified osteoarthritis, unspecified site: Secondary | ICD-10-CM | POA: Diagnosis not present

## 2019-08-10 DIAGNOSIS — I10 Essential (primary) hypertension: Secondary | ICD-10-CM | POA: Diagnosis not present

## 2019-08-10 DIAGNOSIS — M109 Gout, unspecified: Secondary | ICD-10-CM | POA: Diagnosis not present

## 2019-08-10 DIAGNOSIS — Z6841 Body Mass Index (BMI) 40.0 and over, adult: Secondary | ICD-10-CM | POA: Diagnosis not present

## 2019-08-10 DIAGNOSIS — E291 Testicular hypofunction: Secondary | ICD-10-CM | POA: Diagnosis not present

## 2019-08-10 DIAGNOSIS — G8929 Other chronic pain: Secondary | ICD-10-CM | POA: Diagnosis not present

## 2019-08-10 DIAGNOSIS — G4700 Insomnia, unspecified: Secondary | ICD-10-CM | POA: Diagnosis not present

## 2019-08-10 DIAGNOSIS — E1142 Type 2 diabetes mellitus with diabetic polyneuropathy: Secondary | ICD-10-CM | POA: Diagnosis not present

## 2019-08-10 DIAGNOSIS — R32 Unspecified urinary incontinence: Secondary | ICD-10-CM | POA: Diagnosis not present

## 2019-08-10 DIAGNOSIS — E785 Hyperlipidemia, unspecified: Secondary | ICD-10-CM | POA: Diagnosis not present

## 2019-08-10 DIAGNOSIS — N400 Enlarged prostate without lower urinary tract symptoms: Secondary | ICD-10-CM | POA: Diagnosis not present

## 2019-08-21 DIAGNOSIS — G894 Chronic pain syndrome: Secondary | ICD-10-CM | POA: Diagnosis not present

## 2019-08-21 DIAGNOSIS — M4322 Fusion of spine, cervical region: Secondary | ICD-10-CM | POA: Diagnosis not present

## 2019-08-21 DIAGNOSIS — M7918 Myalgia, other site: Secondary | ICD-10-CM | POA: Diagnosis not present

## 2019-08-27 DIAGNOSIS — G894 Chronic pain syndrome: Secondary | ICD-10-CM | POA: Diagnosis not present

## 2019-08-27 DIAGNOSIS — I10 Essential (primary) hypertension: Secondary | ICD-10-CM | POA: Diagnosis not present

## 2019-08-27 DIAGNOSIS — M4322 Fusion of spine, cervical region: Secondary | ICD-10-CM | POA: Diagnosis not present

## 2019-09-24 DIAGNOSIS — M4322 Fusion of spine, cervical region: Secondary | ICD-10-CM | POA: Diagnosis not present

## 2019-09-24 DIAGNOSIS — Z79891 Long term (current) use of opiate analgesic: Secondary | ICD-10-CM | POA: Diagnosis not present

## 2019-09-24 DIAGNOSIS — Z79899 Other long term (current) drug therapy: Secondary | ICD-10-CM | POA: Diagnosis not present

## 2019-09-24 DIAGNOSIS — G894 Chronic pain syndrome: Secondary | ICD-10-CM | POA: Diagnosis not present

## 2019-10-12 DIAGNOSIS — M25571 Pain in right ankle and joints of right foot: Secondary | ICD-10-CM | POA: Diagnosis not present

## 2019-10-12 DIAGNOSIS — Z23 Encounter for immunization: Secondary | ICD-10-CM | POA: Diagnosis not present

## 2019-10-12 DIAGNOSIS — M7989 Other specified soft tissue disorders: Secondary | ICD-10-CM | POA: Diagnosis not present

## 2019-10-29 DIAGNOSIS — G894 Chronic pain syndrome: Secondary | ICD-10-CM | POA: Diagnosis not present

## 2019-10-29 DIAGNOSIS — M4712 Other spondylosis with myelopathy, cervical region: Secondary | ICD-10-CM | POA: Diagnosis not present

## 2019-10-29 DIAGNOSIS — M4322 Fusion of spine, cervical region: Secondary | ICD-10-CM | POA: Diagnosis not present

## 2019-11-01 DIAGNOSIS — L738 Other specified follicular disorders: Secondary | ICD-10-CM | POA: Diagnosis not present

## 2019-11-01 DIAGNOSIS — Z808 Family history of malignant neoplasm of other organs or systems: Secondary | ICD-10-CM | POA: Diagnosis not present

## 2019-11-01 DIAGNOSIS — L739 Follicular disorder, unspecified: Secondary | ICD-10-CM | POA: Diagnosis not present

## 2019-11-01 DIAGNOSIS — L821 Other seborrheic keratosis: Secondary | ICD-10-CM | POA: Diagnosis not present

## 2019-11-01 DIAGNOSIS — B078 Other viral warts: Secondary | ICD-10-CM | POA: Diagnosis not present

## 2019-11-01 DIAGNOSIS — L814 Other melanin hyperpigmentation: Secondary | ICD-10-CM | POA: Diagnosis not present

## 2019-11-19 DIAGNOSIS — G894 Chronic pain syndrome: Secondary | ICD-10-CM | POA: Diagnosis not present

## 2019-11-19 DIAGNOSIS — M4322 Fusion of spine, cervical region: Secondary | ICD-10-CM | POA: Diagnosis not present

## 2019-11-19 DIAGNOSIS — M4712 Other spondylosis with myelopathy, cervical region: Secondary | ICD-10-CM | POA: Diagnosis not present

## 2019-12-03 DIAGNOSIS — G4733 Obstructive sleep apnea (adult) (pediatric): Secondary | ICD-10-CM | POA: Diagnosis not present

## 2019-12-04 ENCOUNTER — Ambulatory Visit (HOSPITAL_COMMUNITY)
Admission: RE | Admit: 2019-12-04 | Discharge: 2019-12-04 | Disposition: A | Discharge status: Home / Self Care | Payer: Medicare HMO | Source: Ambulatory Visit | Attending: Pulmonary Disease | Admitting: Pulmonary Disease

## 2019-12-04 ENCOUNTER — Other Ambulatory Visit: Payer: Self-pay | Admitting: Adult Health

## 2019-12-04 DIAGNOSIS — U071 COVID-19: Secondary | ICD-10-CM | POA: Diagnosis present

## 2019-12-04 DIAGNOSIS — Z6825 Body mass index (BMI) 25.0-25.9, adult: Secondary | ICD-10-CM | POA: Insufficient documentation

## 2019-12-04 DIAGNOSIS — Z23 Encounter for immunization: Secondary | ICD-10-CM | POA: Diagnosis not present

## 2019-12-04 DIAGNOSIS — E119 Type 2 diabetes mellitus without complications: Secondary | ICD-10-CM | POA: Diagnosis not present

## 2019-12-04 MED ORDER — SODIUM CHLORIDE 0.9 % IV SOLN: INTRAVENOUS | Status: DC | PRN

## 2019-12-04 MED ORDER — EPINEPHRINE 0.3 MG/0.3ML IJ SOAJ: 0.3000 mg | Freq: Once | INTRAMUSCULAR | Status: DC | PRN

## 2019-12-04 MED ORDER — DIPHENHYDRAMINE HCL 50 MG/ML IJ SOLN: 50.0000 mg | Freq: Once | INTRAMUSCULAR | Status: DC | PRN

## 2019-12-04 MED ORDER — FAMOTIDINE IN NACL 20-0.9 MG/50ML-% IV SOLN: 20.0000 mg | Freq: Once | INTRAVENOUS | Status: DC | PRN

## 2019-12-04 MED ORDER — ALBUTEROL SULFATE HFA 108 (90 BASE) MCG/ACT IN AERS: 2.0000 | INHALATION_SPRAY | Freq: Once | RESPIRATORY_TRACT | Status: DC | PRN

## 2019-12-04 MED ORDER — SOTROVIMAB 500 MG/8ML IV SOLN
500.0000 mg | Freq: Once | INTRAVENOUS | Status: AC
  Administered 2019-12-04: 500 mg via INTRAVENOUS

## 2019-12-04 MED ORDER — METHYLPREDNISOLONE SODIUM SUCC 125 MG IJ SOLR: 125.0000 mg | Freq: Once | INTRAMUSCULAR | Status: DC | PRN

## 2019-12-04 NOTE — Progress Notes (Signed)
I connected by phone with Peter Bryan on 12/04/2019 at 9:44 AM to discuss the potential use of a new treatment for mild to moderate COVID-19 viral infection in non-hospitalized patients.  This patient is a 59 y.o. male that meets the FDA criteria for Emergency Use Authorization of COVID monoclonal antibody casirivimab/imdevimab, bamlanivimab/eteseviamb, or sotrovimab.  Has a (+) direct SARS-CoV-2 viral test result  Has mild or moderate COVID-19   Is NOT hospitalized due to COVID-19  Is within 10 days of symptom onset  Has at least one of the high risk factor(s) for progression to severe COVID-19 and/or hospitalization as defined in EUA.  Specific high risk criteria : BMI > 25 and Diabetes   I have spoken and communicated the following to the patient or parent/caregiver regarding COVID monoclonal antibody treatment:  1. FDA has authorized the emergency use for the treatment of mild to moderate COVID-19 in adults and pediatric patients with positive results of direct SARS-CoV-2 viral testing who are 62 years of age and older weighing at least 40 kg, and who are at high risk for progressing to severe COVID-19 and/or hospitalization.  2. The significant known and potential risks and benefits of COVID monoclonal antibody, and the extent to which such potential risks and benefits are unknown.  3. Information on available alternative treatments and the risks and benefits of those alternatives, including clinical trials.  4. Patients treated with COVID monoclonal antibody should continue to self-isolate and use infection control measures (e.g., wear mask, isolate, social distance, avoid sharing personal items, clean and disinfect "high touch" surfaces, and frequent handwashing) according to CDC guidelines.   5. The patient or parent/caregiver has the option to accept or refuse COVID monoclonal antibody treatment. 6. Cost reviewed.  Sx onset 11/23  After reviewing this information with the  patient, the patient has agreed to receive one of the available covid 19 monoclonal antibodies and will be provided an appropriate fact sheet prior to infusion. Scot Dock, NP 12/04/2019 9:44 AM

## 2019-12-04 NOTE — Progress Notes (Signed)
Diagnosis: COVID-19  Physician: Dr. Patrick Wright  Procedure: [facility] Infusion [facility] Med: Sotrovimab infusion - Provided patient with sotrovimab fact sheet for patients, parents, and caregivers prior to infusion.   Complications: No immediate complications noted  Discharge: Discharged home    

## 2019-12-04 NOTE — Progress Notes (Signed)
Patient reviewed Fact Sheet for Patients, Parents, and Caregivers for Emergency Use Authorization (EUA) of Sotrovimab for the Treatment of Coronavirus. Patient also reviewed and is agreeable to the estimated cost of treatment. Patient is agreeable to proceed.   

## 2019-12-04 NOTE — Discharge Instructions (Signed)
10 Things You Can Do to Manage Your COVID-19 Symptoms at Home If you have possible or confirmed COVID-19: 1. Stay home from work and school. And stay away from other public places. If you must go out, avoid using any kind of public transportation, ridesharing, or taxis. 2. Monitor your symptoms carefully. If your symptoms get worse, call your healthcare provider immediately. 3. Get rest and stay hydrated. 4. If you have a medical appointment, call the healthcare provider ahead of time and tell them that you have or may have COVID-19. 5. For medical emergencies, call 911 and notify the dispatch personnel that you have or may have COVID-19. 6. Cover your cough and sneezes with a tissue or use the inside of your elbow. 7. Wash your hands often with soap and water for at least 20 seconds or clean your hands with an alcohol-based hand sanitizer that contains at least 60% alcohol. 8. As much as possible, stay in a specific room and away from other people in your home. Also, you should use a separate bathroom, if available. If you need to be around other people in or outside of the home, wear a mask. 9. Avoid sharing personal items with other people in your household, like dishes, towels, and bedding. 10. Clean all surfaces that are touched often, like counters, tabletops, and doorknobs. Use household cleaning sprays or wipes according to the label instructions. cdc.gov/coronavirus 07/05/2018 This information is not intended to replace advice given to you by your health care provider. Make sure you discuss any questions you have with your health care provider. Document Revised: 12/07/2018 Document Reviewed: 12/07/2018 Elsevier Patient Education  2020 [employer] Inc.  What types of side effects do monoclonal antibody drugs cause?  Common side effects  In general, the more common side effects caused by monoclonal antibody drugs include: . Allergic reactions, such as hives or itching . Flu-like signs and  symptoms, including chills, fatigue, fever, and muscle aches and pains . Nausea, vomiting . Diarrhea . Skin rashes . Low blood pressure   The CDC is recommending patients who receive monoclonal antibody treatments wait at least 90 days before being vaccinated.  Currently, there are no data on the safety and efficacy of mRNA COVID-19 vaccines in persons who received monoclonal antibodies or convalescent plasma as part of COVID-19 treatment. Based on the estimated half-life of such therapies as well as evidence suggesting that reinfection is uncommon in the 90 days after initial infection, vaccination should be deferred for at least 90 days, as a precautionary measure until additional information becomes available, to avoid interference of the antibody treatment with vaccine-induced immune responses.  If you have any questions or concerns after the infusion please call the Advanced Practice Provider on call at [phone]. This number is only intended for your use regarding questions or concerns about the infusion post-treatment side-effects.  Please do not provide this number to others for use.   If someone you know is interested in receiving treatment please have them call the COVID hotline at [phone].   

## 2019-12-05 DIAGNOSIS — U071 COVID-19: Secondary | ICD-10-CM | POA: Diagnosis not present

## 2019-12-06 DIAGNOSIS — U071 COVID-19: Secondary | ICD-10-CM | POA: Diagnosis not present

## 2019-12-06 DIAGNOSIS — J189 Pneumonia, unspecified organism: Secondary | ICD-10-CM | POA: Diagnosis not present

## 2019-12-06 DIAGNOSIS — J1282 Pneumonia due to coronavirus disease 2019: Secondary | ICD-10-CM | POA: Diagnosis not present

## 2019-12-06 DIAGNOSIS — J9811 Atelectasis: Secondary | ICD-10-CM | POA: Diagnosis not present

## 2019-12-06 DIAGNOSIS — R059 Cough, unspecified: Secondary | ICD-10-CM | POA: Diagnosis not present

## 2019-12-06 DIAGNOSIS — R509 Fever, unspecified: Secondary | ICD-10-CM | POA: Diagnosis not present

## 2019-12-26 DIAGNOSIS — G894 Chronic pain syndrome: Secondary | ICD-10-CM | POA: Diagnosis not present

## 2019-12-26 DIAGNOSIS — G4733 Obstructive sleep apnea (adult) (pediatric): Secondary | ICD-10-CM | POA: Diagnosis not present

## 2019-12-26 DIAGNOSIS — E1140 Type 2 diabetes mellitus with diabetic neuropathy, unspecified: Secondary | ICD-10-CM | POA: Diagnosis not present

## 2019-12-26 DIAGNOSIS — E1122 Type 2 diabetes mellitus with diabetic chronic kidney disease: Secondary | ICD-10-CM | POA: Diagnosis not present

## 2019-12-26 DIAGNOSIS — N1831 Chronic kidney disease, stage 3a: Secondary | ICD-10-CM | POA: Diagnosis not present

## 2019-12-26 DIAGNOSIS — M4322 Fusion of spine, cervical region: Secondary | ICD-10-CM | POA: Diagnosis not present

## 2019-12-26 DIAGNOSIS — I129 Hypertensive chronic kidney disease with stage 1 through stage 4 chronic kidney disease, or unspecified chronic kidney disease: Secondary | ICD-10-CM | POA: Diagnosis not present

## 2019-12-26 DIAGNOSIS — R0600 Dyspnea, unspecified: Secondary | ICD-10-CM | POA: Diagnosis not present

## 2019-12-26 DIAGNOSIS — E785 Hyperlipidemia, unspecified: Secondary | ICD-10-CM | POA: Diagnosis not present

## 2019-12-26 DIAGNOSIS — Z0000 Encounter for general adult medical examination without abnormal findings: Secondary | ICD-10-CM | POA: Diagnosis not present

## 2019-12-26 DIAGNOSIS — Z6841 Body Mass Index (BMI) 40.0 and over, adult: Secondary | ICD-10-CM | POA: Diagnosis not present

## 2019-12-26 DIAGNOSIS — R42 Dizziness and giddiness: Secondary | ICD-10-CM | POA: Diagnosis not present

## 2019-12-26 DIAGNOSIS — M79604 Pain in right leg: Secondary | ICD-10-CM | POA: Diagnosis not present

## 2019-12-26 DIAGNOSIS — M4712 Other spondylosis with myelopathy, cervical region: Secondary | ICD-10-CM | POA: Diagnosis not present

## 2020-01-05 DIAGNOSIS — G44319 Acute post-traumatic headache, not intractable: Secondary | ICD-10-CM | POA: Diagnosis not present

## 2020-01-05 DIAGNOSIS — W19XXXA Unspecified fall, initial encounter: Secondary | ICD-10-CM | POA: Diagnosis not present

## 2020-01-05 DIAGNOSIS — R52 Pain, unspecified: Secondary | ICD-10-CM | POA: Diagnosis not present

## 2020-01-05 DIAGNOSIS — M5481 Occipital neuralgia: Secondary | ICD-10-CM | POA: Diagnosis not present

## 2020-01-05 DIAGNOSIS — R519 Headache, unspecified: Secondary | ICD-10-CM | POA: Diagnosis not present

## 2020-01-05 DIAGNOSIS — M542 Cervicalgia: Secondary | ICD-10-CM | POA: Diagnosis not present

## 2020-01-05 DIAGNOSIS — Z9889 Other specified postprocedural states: Secondary | ICD-10-CM | POA: Diagnosis not present

## 2020-01-05 DIAGNOSIS — S161XXA Strain of muscle, fascia and tendon at neck level, initial encounter: Secondary | ICD-10-CM | POA: Diagnosis not present

## 2020-01-05 DIAGNOSIS — I10 Essential (primary) hypertension: Secondary | ICD-10-CM | POA: Diagnosis not present

## 2020-01-08 DIAGNOSIS — M25561 Pain in right knee: Secondary | ICD-10-CM | POA: Diagnosis not present

## 2020-01-14 DIAGNOSIS — Z7984 Long term (current) use of oral hypoglycemic drugs: Secondary | ICD-10-CM | POA: Diagnosis not present

## 2020-01-14 DIAGNOSIS — R69 Illness, unspecified: Secondary | ICD-10-CM | POA: Diagnosis not present

## 2020-01-14 DIAGNOSIS — W19XXXD Unspecified fall, subsequent encounter: Secondary | ICD-10-CM | POA: Diagnosis not present

## 2020-01-14 DIAGNOSIS — E1122 Type 2 diabetes mellitus with diabetic chronic kidney disease: Secondary | ICD-10-CM | POA: Diagnosis not present

## 2020-01-14 DIAGNOSIS — N1831 Chronic kidney disease, stage 3a: Secondary | ICD-10-CM | POA: Diagnosis not present

## 2020-01-14 DIAGNOSIS — I129 Hypertensive chronic kidney disease with stage 1 through stage 4 chronic kidney disease, or unspecified chronic kidney disease: Secondary | ICD-10-CM | POA: Diagnosis not present

## 2020-01-17 DIAGNOSIS — M170 Bilateral primary osteoarthritis of knee: Secondary | ICD-10-CM | POA: Diagnosis not present

## 2020-01-23 DIAGNOSIS — N509 Disorder of male genital organs, unspecified: Secondary | ICD-10-CM | POA: Diagnosis not present

## 2020-01-23 DIAGNOSIS — E291 Testicular hypofunction: Secondary | ICD-10-CM | POA: Diagnosis not present

## 2020-01-24 DIAGNOSIS — M170 Bilateral primary osteoarthritis of knee: Secondary | ICD-10-CM | POA: Diagnosis not present

## 2020-01-28 DIAGNOSIS — M4322 Fusion of spine, cervical region: Secondary | ICD-10-CM | POA: Diagnosis not present

## 2020-01-28 DIAGNOSIS — G894 Chronic pain syndrome: Secondary | ICD-10-CM | POA: Diagnosis not present

## 2020-01-28 DIAGNOSIS — M4712 Other spondylosis with myelopathy, cervical region: Secondary | ICD-10-CM | POA: Diagnosis not present

## 2020-01-29 DIAGNOSIS — E291 Testicular hypofunction: Secondary | ICD-10-CM | POA: Diagnosis not present

## 2020-01-29 DIAGNOSIS — R350 Frequency of micturition: Secondary | ICD-10-CM | POA: Diagnosis not present

## 2020-01-29 DIAGNOSIS — N5203 Combined arterial insufficiency and corporo-venous occlusive erectile dysfunction: Secondary | ICD-10-CM | POA: Diagnosis not present

## 2020-01-29 DIAGNOSIS — R7989 Other specified abnormal findings of blood chemistry: Secondary | ICD-10-CM | POA: Diagnosis not present

## 2020-01-30 ENCOUNTER — Other Ambulatory Visit: Payer: Self-pay | Admitting: Orthopaedic Surgery

## 2020-01-30 DIAGNOSIS — M4322 Fusion of spine, cervical region: Secondary | ICD-10-CM

## 2020-01-31 DIAGNOSIS — M170 Bilateral primary osteoarthritis of knee: Secondary | ICD-10-CM | POA: Diagnosis not present

## 2020-02-22 DIAGNOSIS — E119 Type 2 diabetes mellitus without complications: Secondary | ICD-10-CM | POA: Diagnosis not present

## 2020-02-22 DIAGNOSIS — Z135 Encounter for screening for eye and ear disorders: Secondary | ICD-10-CM | POA: Diagnosis not present

## 2020-02-22 DIAGNOSIS — H524 Presbyopia: Secondary | ICD-10-CM | POA: Diagnosis not present

## 2020-02-27 ENCOUNTER — Ambulatory Visit
Admission: RE | Admit: 2020-02-27 | Discharge: 2020-02-27 | Disposition: A | Discharge status: Home / Self Care | Payer: Medicare HMO | Source: Ambulatory Visit | Attending: Orthopaedic Surgery | Admitting: Orthopaedic Surgery

## 2020-02-27 ENCOUNTER — Other Ambulatory Visit: Payer: Self-pay

## 2020-02-27 DIAGNOSIS — M4322 Fusion of spine, cervical region: Secondary | ICD-10-CM

## 2020-02-27 DIAGNOSIS — M4802 Spinal stenosis, cervical region: Secondary | ICD-10-CM | POA: Diagnosis not present

## 2020-03-03 DIAGNOSIS — E1169 Type 2 diabetes mellitus with other specified complication: Secondary | ICD-10-CM | POA: Diagnosis not present

## 2020-03-03 DIAGNOSIS — E669 Obesity, unspecified: Secondary | ICD-10-CM | POA: Diagnosis not present

## 2020-03-03 DIAGNOSIS — E785 Hyperlipidemia, unspecified: Secondary | ICD-10-CM | POA: Diagnosis not present

## 2020-03-03 DIAGNOSIS — N183 Chronic kidney disease, stage 3 unspecified: Secondary | ICD-10-CM | POA: Diagnosis not present

## 2020-03-03 DIAGNOSIS — M1000 Idiopathic gout, unspecified site: Secondary | ICD-10-CM | POA: Diagnosis not present

## 2020-03-03 DIAGNOSIS — I10 Essential (primary) hypertension: Secondary | ICD-10-CM | POA: Diagnosis not present

## 2020-03-03 DIAGNOSIS — E875 Hyperkalemia: Secondary | ICD-10-CM | POA: Diagnosis not present

## 2020-03-03 DIAGNOSIS — E559 Vitamin D deficiency, unspecified: Secondary | ICD-10-CM | POA: Diagnosis not present

## 2020-03-03 DIAGNOSIS — G4733 Obstructive sleep apnea (adult) (pediatric): Secondary | ICD-10-CM | POA: Diagnosis not present

## 2020-03-05 DIAGNOSIS — G894 Chronic pain syndrome: Secondary | ICD-10-CM | POA: Diagnosis not present

## 2020-03-05 DIAGNOSIS — M4712 Other spondylosis with myelopathy, cervical region: Secondary | ICD-10-CM | POA: Diagnosis not present

## 2020-03-05 DIAGNOSIS — M4322 Fusion of spine, cervical region: Secondary | ICD-10-CM | POA: Diagnosis not present

## 2020-03-12 ENCOUNTER — Encounter: Payer: Self-pay | Admitting: Neurology

## 2020-03-12 ENCOUNTER — Other Ambulatory Visit: Payer: Self-pay

## 2020-03-12 DIAGNOSIS — R202 Paresthesia of skin: Secondary | ICD-10-CM

## 2020-03-24 DIAGNOSIS — G894 Chronic pain syndrome: Secondary | ICD-10-CM | POA: Diagnosis not present

## 2020-03-24 DIAGNOSIS — M4712 Other spondylosis with myelopathy, cervical region: Secondary | ICD-10-CM | POA: Diagnosis not present

## 2020-03-24 DIAGNOSIS — M4322 Fusion of spine, cervical region: Secondary | ICD-10-CM | POA: Diagnosis not present

## 2020-03-25 ENCOUNTER — Other Ambulatory Visit: Payer: Self-pay

## 2020-03-25 ENCOUNTER — Ambulatory Visit: Payer: Medicare HMO | Admitting: Neurology

## 2020-03-25 DIAGNOSIS — M5412 Radiculopathy, cervical region: Secondary | ICD-10-CM

## 2020-03-25 DIAGNOSIS — R202 Paresthesia of skin: Secondary | ICD-10-CM | POA: Diagnosis not present

## 2020-03-25 DIAGNOSIS — G5603 Carpal tunnel syndrome, bilateral upper limbs: Secondary | ICD-10-CM

## 2020-03-25 NOTE — Procedures (Signed)
[facility]  [address]  [place], [place] [address] Tel: [phone] Fax:  [phone] Test Date:  03/25/2020  Patient: Peter Bryan DOB: 1960/11/18 Physician: Narda Amber, DO  Sex: Male Height: '5\' 11"'$  Ref Phys: Starling Manns, MD  ID#: [id number]   Technician:    Patient Complaints: This is a 60 year old man with history of cervical surgery referred for evaluation of bilateral paresthesias.  NCV & EMG Findings: Extensive electrodiagnostic testing of the right upper and lower extremity shows:  1. Bilateral median sensory responses show prolonged latency (L4.6, R6.3 ms).  Bilateral ulnar and radial sensory responses are within normal limits. 2. Bilateral median motor responses show prolonged latency (L5.0, R6.1 ms).  Bilateral ulnar motor responses are within normal limits.  3. Chronic motor axonal loss changes are seen affecting the C5 myotomes and abductor pollicis brevis muscles bilaterally, there is no evidence of accompanied active denervation  Impression: 1. Bilateral median neuropathy at or distal to the wrist, consistent with a clinical diagnosis of carpal tunnel syndrome.  Overall, these findings are moderate in degree electrically. 2. Chronic C5 radiculopathy affecting bilateral upper extremities, moderate.   ___________________________ Narda Amber, DO    Nerve Conduction Studies Anti Sensory Summary Table   Stim Site NR Peak (ms) Norm Peak (ms) P-T Amp (V) Norm P-T Amp  Left Median Anti Sensory (2nd Digit)  33C  Wrist    4.6 <3.8 12.2 >10  Right Median Anti Sensory (2nd Digit)  33C  Wrist    6.3 <3.8 10.4 >10  Left Radial Anti Sensory (Base 1st Digit)  33C  Wrist    2.7 <2.8 23.3 >10  Right Radial Anti Sensory (Base 1st Digit)  33C  Wrist    2.6 <2.8 25.3 >10  Left Ulnar Anti Sensory (5th Digit)  33C  Wrist    3.0 <3.2 13.3 >5  Right Ulnar Anti Sensory (5th Digit)  33C  Wrist    3.2 <3.2 12.2 >5   Motor Summary Table   Stim  Site NR Onset (ms) Norm Onset (ms) O-P Amp (mV) Norm O-P Amp Site1 Site2 Delta-0 (ms) Dist (cm) Vel (m/s) Norm Vel (m/s)  Left Median Motor (Abd Poll Brev)  33C  Wrist    5.0 <4.0 5.2 >5 Elbow Wrist 6.3 32.0 51 >50  Elbow    11.3  4.6         Right Median Motor (Abd Poll Brev)  33C  Wrist    6.1 <4.0 6.2 >5 Elbow Wrist 5.9 31.0 53 >50  Elbow    12.0  5.4         Left Ulnar Motor (Abd Dig Minimi)  33C  Wrist    3.0 <3.1 9.2 >7 B Elbow Wrist 5.0 25.0 50 >50  B Elbow    8.0  8.1  A Elbow B Elbow 1.9 10.0 53 >50  A Elbow    9.9  7.6         Right Ulnar Motor (Abd Dig Minimi)  33C  Wrist    2.8 <3.1 9.0 >7 B Elbow Wrist 4.7 27.0 57 >50  B Elbow    7.5  7.8  A Elbow B Elbow 1.9 10.0 53 >50  A Elbow    9.4  7.5          EMG   Side Muscle Ins Act Fibs Psw Fasc Number Recrt Dur Dur. Amp Amp. Poly Poly. Comment  Right 1stDorInt Nml Nml Nml Nml Nml Nml Nml Nml  Nml Nml Nml Nml N/A  Right Abd Poll Brev Nml Nml Nml Nml 1- Rapid Some 1+ Some 1+ Some 1+ N/A  Right PronatorTeres Nml Nml Nml Nml Nml Nml Nml Nml Nml Nml Nml Nml N/A  Right Biceps Nml Nml Nml Nml 1- Rapid Few 1+ Few 1+ Nml Nml N/A  Right Triceps Nml Nml Nml Nml Nml Nml Nml Nml Nml Nml Nml Nml N/A  Right Deltoid Nml Nml Nml Nml 1- Rapid Many 1+ Many 1+ Many 1+ N/A  Left 1stDorInt Nml Nml Nml Nml Nml Nml Nml Nml Nml Nml Nml Nml N/A  Left Abd Poll Brev Nml Nml Nml Nml 1- Rapid Some 1+ Some 1+ Few 1+ N/A  Left PronatorTeres Nml Nml Nml Nml Nml Nml Nml Nml Nml Nml Nml Nml N/A  Left Biceps Nml Nml Nml Nml 1- Rapid Few 1+ Few 1+ Nml Nml N/A  Left Triceps Nml Nml Nml Nml Nml Nml Nml Nml Nml Nml Nml Nml N/A  Left Deltoid Nml Nml Nml Nml 1- Rapid Many 1+ Many 1+ Many 1+ N/A      Waveforms:

## 2020-04-07 DIAGNOSIS — I129 Hypertensive chronic kidney disease with stage 1 through stage 4 chronic kidney disease, or unspecified chronic kidney disease: Secondary | ICD-10-CM | POA: Diagnosis not present

## 2020-04-07 DIAGNOSIS — Z7984 Long term (current) use of oral hypoglycemic drugs: Secondary | ICD-10-CM | POA: Diagnosis not present

## 2020-04-07 DIAGNOSIS — E785 Hyperlipidemia, unspecified: Secondary | ICD-10-CM | POA: Diagnosis not present

## 2020-04-07 DIAGNOSIS — N1831 Chronic kidney disease, stage 3a: Secondary | ICD-10-CM | POA: Diagnosis not present

## 2020-04-07 DIAGNOSIS — E1122 Type 2 diabetes mellitus with diabetic chronic kidney disease: Secondary | ICD-10-CM | POA: Diagnosis not present

## 2020-04-07 DIAGNOSIS — G4733 Obstructive sleep apnea (adult) (pediatric): Secondary | ICD-10-CM | POA: Diagnosis not present

## 2020-04-07 DIAGNOSIS — E1140 Type 2 diabetes mellitus with diabetic neuropathy, unspecified: Secondary | ICD-10-CM | POA: Diagnosis not present

## 2020-04-07 DIAGNOSIS — Z0000 Encounter for general adult medical examination without abnormal findings: Secondary | ICD-10-CM | POA: Diagnosis not present

## 2020-04-07 DIAGNOSIS — Z9989 Dependence on other enabling machines and devices: Secondary | ICD-10-CM | POA: Diagnosis not present

## 2020-04-07 DIAGNOSIS — Z6841 Body Mass Index (BMI) 40.0 and over, adult: Secondary | ICD-10-CM | POA: Diagnosis not present

## 2020-04-07 DIAGNOSIS — G894 Chronic pain syndrome: Secondary | ICD-10-CM | POA: Diagnosis not present

## 2020-04-09 ENCOUNTER — Ambulatory Visit: Payer: Medicare HMO | Admitting: Neurology

## 2020-04-09 ENCOUNTER — Other Ambulatory Visit: Payer: Self-pay

## 2020-04-09 DIAGNOSIS — G629 Polyneuropathy, unspecified: Secondary | ICD-10-CM

## 2020-04-09 DIAGNOSIS — H524 Presbyopia: Secondary | ICD-10-CM | POA: Diagnosis not present

## 2020-04-09 DIAGNOSIS — H5203 Hypermetropia, bilateral: Secondary | ICD-10-CM | POA: Diagnosis not present

## 2020-04-09 DIAGNOSIS — R202 Paresthesia of skin: Secondary | ICD-10-CM | POA: Diagnosis not present

## 2020-04-09 DIAGNOSIS — M5416 Radiculopathy, lumbar region: Secondary | ICD-10-CM

## 2020-04-09 DIAGNOSIS — H52209 Unspecified astigmatism, unspecified eye: Secondary | ICD-10-CM | POA: Diagnosis not present

## 2020-04-09 NOTE — Procedures (Signed)
[facility] [place] [facility]  [address], [address]  [place], [place] [address] Tel: [phone] Fax:  [phone] Test Date:  04/09/2020  Patient: Peter Bryan DOB: 1960-06-01 Physician: Narda Amber, DO  Sex: Male Height: '5\' 11"'$  Ref Phys: Starling Manns, MD  ID#: [id number]   Technician:    Patient Complaints: This is a 60 year old man with history of lumbar surgery referred for evaluation of bilateral leg paresthesias.  NCV & EMG Findings: Extensive electrodiagnostic testing of the right upper lower extremity and additional studies of the left shows:  1. Bilateral sural and superficial peroneal sensory responses are absent. 2. Left peroneal motor responses are absent at the extensor digitorum brevis and tibialis anterior.  Bilateral tibial motor responses are essentially absent.  Right peroneal motor responses within normal limits. 3. Bilateral tibial H reflex studies are prolonged. 4. Chronic motor axonal loss changes are seen affecting the muscles below the knee as well as the gluteus medius muscles bilaterally.  These findings are more prominent in the left L5 myotome.  There is no evidence of accompanied active denervation.  Impression: 1. Chronic sensorimotor axonal polyneuropathy affecting the lower extremities. 2. Chronic L5 radiculopathy affecting bilateral lower extremities, severe on the left.   ___________________________ Narda Amber, DO    Nerve Conduction Studies Anti Sensory Summary Table   Stim Site NR Peak (ms) Norm Peak (ms) P-T Amp (V) Norm P-T Amp  Left Sup Peroneal Anti Sensory (Ant Lat Mall)  32C  12 cm NR  <4.6  >3  Right Sup Peroneal Anti Sensory (Ant Lat Mall)  32C  12 cm NR  <4.6  >3  Left Sural Anti Sensory (Lat Mall)  32C  Calf NR  <4.6  >3  Right Sural Anti Sensory (Lat Mall)  32C  Calf NR  <4.6  >3   Motor Summary Table   Stim Site NR Onset (ms) Norm Onset (ms) O-P Amp (mV) Norm O-P Amp Site1 Site2 Delta-0 (ms) Dist (cm) Vel (m/s) Norm Vel  (m/s)  Left Peroneal Motor (Ext Dig Brev)  32C  Ankle NR  <6.0  >2.5 B Fib Ankle  0.0  >40  B Fib NR     Poplt B Fib  0.0  >40  Poplt NR            Right Peroneal Motor (Ext Dig Brev)  32C  Ankle    4.1 <6.0 2.5 >2.5 B Fib Ankle 10.0 40.0 40 >40  B Fib    14.1  2.0  Poplt B Fib 2.1 10.0 48 >40  Poplt    16.2  1.9         Left Peroneal TA Motor (Tib Ant)  32C  Fib Head NR  <4.5  >3 Poplit Fib Head  0.0  >40  Poplit NR            Right Peroneal TA Motor (Tib Ant)  32C  Fib Head    3.4 <4.5 3.6 >3 Poplit Fib Head 1.7 10.0 59 >40  Poplit    5.1  3.4         Left Tibial Motor (Abd Hall Brev)  32C  Ankle    5.3 <6.0 1.2 >4 Knee Ankle  0.0  >40  Knee NR            Right Tibial Motor (Abd Hall Brev)  32C  Ankle NR  <6.0  >4 Knee Ankle  0.0  >40  Knee NR  H Reflex Studies   NR H-Lat (ms) Lat Norm (ms) L-R H-Lat (ms)  Left Tibial (Gastroc)  32C     40.00 <35 0.00  Right Tibial (Gastroc)  32C     40.00 <35 0.00   EMG   Side Muscle Ins Act Fibs Psw Fasc Number Recrt Dur Dur. Amp Amp. Poly Poly. Comment  Right AntTibialis Nml Nml Nml Nml 1- Rapid Some 1+ Some 1+ Some 1+ N/A  Right Gastroc Nml Nml Nml Nml 1- Rapid Few 1+ Few 1+ Nml Nml N/A  Right Flex Dig Long Nml Nml Nml Nml 1- Rapid Some 1+ Some 1+ Some 1+ N/A  Right RectFemoris Nml Nml Nml Nml Nml Nml Nml Nml Nml Nml Nml Nml N/A  Right GluteusMed Nml Nml Nml Nml 1- Rapid Some 1+ Some 1+ Nml Nml N/A  Right BicepsFemS Nml Nml Nml Nml Nml Nml Nml Nml Nml Nml Nml Nml N/A  Left Gastroc Nml Nml Nml Nml 1- Rapid Few 1+ Few 1+ Few 1+ N/A  Left Flex Dig Long Nml Nml Nml Nml 3- Rapid Most 1+ Many 1+ Some 1+ N/A  Left RectFemoris Nml Nml Nml Nml Nml Nml Nml Nml Nml Nml Nml Nml N/A  Left GluteusMed Nml Nml Nml Nml 2- Rapid Some 1+ Some 1+ Some 1+ N/A  Left BicepsFemS Nml Nml Nml Nml Nml Nml Nml Nml Nml Nml Nml Nml N/A  Left AntTibialis Nml Nml Nml Nml [facility] Rapid All 1+ All 1+ All 1+ N/A      Waveforms:

## 2020-04-19 DIAGNOSIS — S0180XA Unspecified open wound of other part of head, initial encounter: Secondary | ICD-10-CM | POA: Diagnosis not present

## 2020-04-25 DIAGNOSIS — N529 Male erectile dysfunction, unspecified: Secondary | ICD-10-CM | POA: Diagnosis not present

## 2020-04-28 DIAGNOSIS — S0180XD Unspecified open wound of other part of head, subsequent encounter: Secondary | ICD-10-CM | POA: Diagnosis not present

## 2020-04-30 DIAGNOSIS — M4322 Fusion of spine, cervical region: Secondary | ICD-10-CM | POA: Diagnosis not present

## 2020-04-30 DIAGNOSIS — Z6841 Body Mass Index (BMI) 40.0 and over, adult: Secondary | ICD-10-CM | POA: Diagnosis not present

## 2020-04-30 DIAGNOSIS — M4712 Other spondylosis with myelopathy, cervical region: Secondary | ICD-10-CM | POA: Diagnosis not present

## 2020-04-30 DIAGNOSIS — G894 Chronic pain syndrome: Secondary | ICD-10-CM | POA: Diagnosis not present

## 2020-05-07 ENCOUNTER — Encounter: Payer: Medicare HMO | Admitting: Neurology

## 2020-05-09 DIAGNOSIS — E1169 Type 2 diabetes mellitus with other specified complication: Secondary | ICD-10-CM | POA: Diagnosis not present

## 2020-05-09 DIAGNOSIS — E875 Hyperkalemia: Secondary | ICD-10-CM | POA: Diagnosis not present

## 2020-05-09 DIAGNOSIS — G4733 Obstructive sleep apnea (adult) (pediatric): Secondary | ICD-10-CM | POA: Diagnosis not present

## 2020-05-09 DIAGNOSIS — I10 Essential (primary) hypertension: Secondary | ICD-10-CM | POA: Diagnosis not present

## 2020-05-09 DIAGNOSIS — E669 Obesity, unspecified: Secondary | ICD-10-CM | POA: Diagnosis not present

## 2020-05-09 DIAGNOSIS — N183 Chronic kidney disease, stage 3 unspecified: Secondary | ICD-10-CM | POA: Diagnosis not present

## 2020-05-09 DIAGNOSIS — M1000 Idiopathic gout, unspecified site: Secondary | ICD-10-CM | POA: Diagnosis not present

## 2020-05-09 DIAGNOSIS — E785 Hyperlipidemia, unspecified: Secondary | ICD-10-CM | POA: Diagnosis not present

## 2020-05-13 ENCOUNTER — Encounter: Payer: Medicare HMO | Admitting: Neurology

## 2020-05-16 DIAGNOSIS — R69 Illness, unspecified: Secondary | ICD-10-CM | POA: Diagnosis not present

## 2020-05-16 DIAGNOSIS — M961 Postlaminectomy syndrome, not elsewhere classified: Secondary | ICD-10-CM | POA: Diagnosis not present

## 2020-05-16 DIAGNOSIS — R2689 Other abnormalities of gait and mobility: Secondary | ICD-10-CM | POA: Diagnosis not present

## 2020-05-16 DIAGNOSIS — Z79899 Other long term (current) drug therapy: Secondary | ICD-10-CM | POA: Diagnosis not present

## 2020-05-16 DIAGNOSIS — E1140 Type 2 diabetes mellitus with diabetic neuropathy, unspecified: Secondary | ICD-10-CM | POA: Diagnosis not present

## 2020-05-16 DIAGNOSIS — G5603 Carpal tunnel syndrome, bilateral upper limbs: Secondary | ICD-10-CM | POA: Diagnosis not present

## 2020-05-16 DIAGNOSIS — G4733 Obstructive sleep apnea (adult) (pediatric): Secondary | ICD-10-CM | POA: Diagnosis not present

## 2020-05-19 DIAGNOSIS — M4322 Fusion of spine, cervical region: Secondary | ICD-10-CM | POA: Diagnosis not present

## 2020-05-19 DIAGNOSIS — G894 Chronic pain syndrome: Secondary | ICD-10-CM | POA: Diagnosis not present

## 2020-05-19 DIAGNOSIS — M4712 Other spondylosis with myelopathy, cervical region: Secondary | ICD-10-CM | POA: Diagnosis not present

## 2020-05-19 DIAGNOSIS — Z6841 Body Mass Index (BMI) 40.0 and over, adult: Secondary | ICD-10-CM | POA: Diagnosis not present

## 2020-06-12 DIAGNOSIS — G4733 Obstructive sleep apnea (adult) (pediatric): Secondary | ICD-10-CM | POA: Diagnosis not present

## 2020-06-12 DIAGNOSIS — E785 Hyperlipidemia, unspecified: Secondary | ICD-10-CM | POA: Diagnosis not present

## 2020-06-12 DIAGNOSIS — I129 Hypertensive chronic kidney disease with stage 1 through stage 4 chronic kidney disease, or unspecified chronic kidney disease: Secondary | ICD-10-CM | POA: Diagnosis not present

## 2020-06-12 DIAGNOSIS — Z9989 Dependence on other enabling machines and devices: Secondary | ICD-10-CM | POA: Diagnosis not present

## 2020-06-12 DIAGNOSIS — M1990 Unspecified osteoarthritis, unspecified site: Secondary | ICD-10-CM | POA: Diagnosis not present

## 2020-06-12 DIAGNOSIS — N189 Chronic kidney disease, unspecified: Secondary | ICD-10-CM | POA: Diagnosis not present

## 2020-06-12 DIAGNOSIS — E1122 Type 2 diabetes mellitus with diabetic chronic kidney disease: Secondary | ICD-10-CM | POA: Diagnosis not present

## 2020-06-16 DIAGNOSIS — R948 Abnormal results of function studies of other organs and systems: Secondary | ICD-10-CM | POA: Diagnosis not present

## 2020-06-16 DIAGNOSIS — R635 Abnormal weight gain: Secondary | ICD-10-CM | POA: Diagnosis not present

## 2020-07-28 DIAGNOSIS — I10 Essential (primary) hypertension: Secondary | ICD-10-CM | POA: Diagnosis not present

## 2020-07-28 DIAGNOSIS — M4712 Other spondylosis with myelopathy, cervical region: Secondary | ICD-10-CM | POA: Diagnosis not present

## 2020-07-28 DIAGNOSIS — M4322 Fusion of spine, cervical region: Secondary | ICD-10-CM | POA: Diagnosis not present

## 2020-07-28 DIAGNOSIS — G894 Chronic pain syndrome: Secondary | ICD-10-CM | POA: Diagnosis not present

## 2020-08-18 DIAGNOSIS — R2689 Other abnormalities of gait and mobility: Secondary | ICD-10-CM | POA: Diagnosis not present

## 2020-08-18 DIAGNOSIS — G4733 Obstructive sleep apnea (adult) (pediatric): Secondary | ICD-10-CM | POA: Diagnosis not present

## 2020-08-18 DIAGNOSIS — M4802 Spinal stenosis, cervical region: Secondary | ICD-10-CM | POA: Diagnosis not present

## 2020-08-18 DIAGNOSIS — Z9989 Dependence on other enabling machines and devices: Secondary | ICD-10-CM | POA: Diagnosis not present

## 2020-08-22 DIAGNOSIS — D649 Anemia, unspecified: Secondary | ICD-10-CM | POA: Diagnosis not present

## 2020-08-22 DIAGNOSIS — E875 Hyperkalemia: Secondary | ICD-10-CM | POA: Diagnosis not present

## 2020-08-22 DIAGNOSIS — E559 Vitamin D deficiency, unspecified: Secondary | ICD-10-CM | POA: Diagnosis not present

## 2020-08-22 DIAGNOSIS — M1000 Idiopathic gout, unspecified site: Secondary | ICD-10-CM | POA: Diagnosis not present

## 2020-08-22 DIAGNOSIS — E211 Secondary hyperparathyroidism, not elsewhere classified: Secondary | ICD-10-CM | POA: Diagnosis not present

## 2020-08-22 DIAGNOSIS — N183 Chronic kidney disease, stage 3 unspecified: Secondary | ICD-10-CM | POA: Diagnosis not present

## 2020-08-22 DIAGNOSIS — I10 Essential (primary) hypertension: Secondary | ICD-10-CM | POA: Diagnosis not present

## 2020-08-22 DIAGNOSIS — E785 Hyperlipidemia, unspecified: Secondary | ICD-10-CM | POA: Diagnosis not present

## 2020-08-22 DIAGNOSIS — E039 Hypothyroidism, unspecified: Secondary | ICD-10-CM | POA: Diagnosis not present

## 2020-08-22 DIAGNOSIS — N189 Chronic kidney disease, unspecified: Secondary | ICD-10-CM | POA: Diagnosis not present

## 2020-08-22 DIAGNOSIS — G4733 Obstructive sleep apnea (adult) (pediatric): Secondary | ICD-10-CM | POA: Diagnosis not present

## 2020-08-22 DIAGNOSIS — E1169 Type 2 diabetes mellitus with other specified complication: Secondary | ICD-10-CM | POA: Diagnosis not present

## 2020-08-22 DIAGNOSIS — R809 Proteinuria, unspecified: Secondary | ICD-10-CM | POA: Diagnosis not present

## 2020-08-22 DIAGNOSIS — R309 Painful micturition, unspecified: Secondary | ICD-10-CM | POA: Diagnosis not present

## 2020-08-22 DIAGNOSIS — E669 Obesity, unspecified: Secondary | ICD-10-CM | POA: Diagnosis not present

## 2020-09-29 DIAGNOSIS — G894 Chronic pain syndrome: Secondary | ICD-10-CM | POA: Diagnosis not present

## 2020-09-29 DIAGNOSIS — I10 Essential (primary) hypertension: Secondary | ICD-10-CM | POA: Diagnosis not present

## 2020-09-29 DIAGNOSIS — M4712 Other spondylosis with myelopathy, cervical region: Secondary | ICD-10-CM | POA: Diagnosis not present

## 2020-09-29 DIAGNOSIS — M4322 Fusion of spine, cervical region: Secondary | ICD-10-CM | POA: Diagnosis not present

## 2020-10-20 DIAGNOSIS — N401 Enlarged prostate with lower urinary tract symptoms: Secondary | ICD-10-CM | POA: Diagnosis not present

## 2020-10-20 DIAGNOSIS — R350 Frequency of micturition: Secondary | ICD-10-CM | POA: Diagnosis not present

## 2020-10-20 DIAGNOSIS — R351 Nocturia: Secondary | ICD-10-CM | POA: Diagnosis not present

## 2020-10-20 DIAGNOSIS — R3915 Urgency of urination: Secondary | ICD-10-CM | POA: Diagnosis not present

## 2020-10-20 DIAGNOSIS — N529 Male erectile dysfunction, unspecified: Secondary | ICD-10-CM | POA: Diagnosis not present

## 2020-10-20 DIAGNOSIS — E291 Testicular hypofunction: Secondary | ICD-10-CM | POA: Diagnosis not present

## 2020-10-28 DIAGNOSIS — G894 Chronic pain syndrome: Secondary | ICD-10-CM | POA: Diagnosis not present

## 2020-10-28 DIAGNOSIS — M546 Pain in thoracic spine: Secondary | ICD-10-CM | POA: Diagnosis not present

## 2020-10-28 DIAGNOSIS — M47894 Other spondylosis, thoracic region: Secondary | ICD-10-CM | POA: Diagnosis not present

## 2020-10-29 ENCOUNTER — Other Ambulatory Visit: Payer: Self-pay | Admitting: Orthopaedic Surgery

## 2020-10-29 DIAGNOSIS — G894 Chronic pain syndrome: Secondary | ICD-10-CM

## 2020-10-29 DIAGNOSIS — M546 Pain in thoracic spine: Secondary | ICD-10-CM

## 2020-11-15 ENCOUNTER — Ambulatory Visit
Admission: RE | Admit: 2020-11-15 | Discharge: 2020-11-15 | Disposition: A | Discharge status: Home / Self Care | Payer: Medicare HMO | Source: Ambulatory Visit | Attending: Orthopaedic Surgery | Admitting: Orthopaedic Surgery

## 2020-11-15 ENCOUNTER — Other Ambulatory Visit: Payer: Self-pay

## 2020-11-15 DIAGNOSIS — M546 Pain in thoracic spine: Secondary | ICD-10-CM

## 2020-11-15 DIAGNOSIS — G894 Chronic pain syndrome: Secondary | ICD-10-CM

## 2020-11-15 DIAGNOSIS — M4804 Spinal stenosis, thoracic region: Secondary | ICD-10-CM | POA: Diagnosis not present

## 2020-11-15 MED ORDER — GADOBENATE DIMEGLUMINE 529 MG/ML IV SOLN
20.0000 mL | Freq: Once | INTRAVENOUS | Status: AC | PRN
  Administered 2020-11-15: 20 mL via INTRAVENOUS

## 2020-11-19 DIAGNOSIS — M4714 Other spondylosis with myelopathy, thoracic region: Secondary | ICD-10-CM | POA: Diagnosis not present

## 2020-11-19 DIAGNOSIS — Z6841 Body Mass Index (BMI) 40.0 and over, adult: Secondary | ICD-10-CM | POA: Diagnosis not present

## 2020-11-19 DIAGNOSIS — M4804 Spinal stenosis, thoracic region: Secondary | ICD-10-CM | POA: Diagnosis not present

## 2020-11-19 DIAGNOSIS — I10 Essential (primary) hypertension: Secondary | ICD-10-CM | POA: Diagnosis not present

## 2020-11-21 DIAGNOSIS — N1831 Chronic kidney disease, stage 3a: Secondary | ICD-10-CM | POA: Diagnosis not present

## 2020-11-21 DIAGNOSIS — G4733 Obstructive sleep apnea (adult) (pediatric): Secondary | ICD-10-CM | POA: Diagnosis not present

## 2020-11-21 DIAGNOSIS — E1122 Type 2 diabetes mellitus with diabetic chronic kidney disease: Secondary | ICD-10-CM | POA: Diagnosis not present

## 2020-11-21 DIAGNOSIS — Z7984 Long term (current) use of oral hypoglycemic drugs: Secondary | ICD-10-CM | POA: Diagnosis not present

## 2020-11-21 DIAGNOSIS — E785 Hyperlipidemia, unspecified: Secondary | ICD-10-CM | POA: Diagnosis not present

## 2020-11-21 DIAGNOSIS — Z79899 Other long term (current) drug therapy: Secondary | ICD-10-CM | POA: Diagnosis not present

## 2020-11-21 DIAGNOSIS — I129 Hypertensive chronic kidney disease with stage 1 through stage 4 chronic kidney disease, or unspecified chronic kidney disease: Secondary | ICD-10-CM | POA: Diagnosis not present

## 2020-11-21 DIAGNOSIS — M5136 Other intervertebral disc degeneration, lumbar region: Secondary | ICD-10-CM | POA: Diagnosis not present

## 2020-11-26 DIAGNOSIS — E291 Testicular hypofunction: Secondary | ICD-10-CM | POA: Diagnosis not present

## 2020-11-26 DIAGNOSIS — Z125 Encounter for screening for malignant neoplasm of prostate: Secondary | ICD-10-CM | POA: Diagnosis not present

## 2020-11-26 DIAGNOSIS — E875 Hyperkalemia: Secondary | ICD-10-CM | POA: Diagnosis not present

## 2020-12-04 DIAGNOSIS — Z6841 Body Mass Index (BMI) 40.0 and over, adult: Secondary | ICD-10-CM | POA: Diagnosis not present

## 2020-12-04 DIAGNOSIS — G4733 Obstructive sleep apnea (adult) (pediatric): Secondary | ICD-10-CM | POA: Diagnosis not present

## 2020-12-15 DIAGNOSIS — N189 Chronic kidney disease, unspecified: Secondary | ICD-10-CM | POA: Diagnosis not present

## 2020-12-15 DIAGNOSIS — R309 Painful micturition, unspecified: Secondary | ICD-10-CM | POA: Diagnosis not present

## 2020-12-15 DIAGNOSIS — E1169 Type 2 diabetes mellitus with other specified complication: Secondary | ICD-10-CM | POA: Diagnosis not present

## 2020-12-15 DIAGNOSIS — M1000 Idiopathic gout, unspecified site: Secondary | ICD-10-CM | POA: Diagnosis not present

## 2020-12-15 DIAGNOSIS — E669 Obesity, unspecified: Secondary | ICD-10-CM | POA: Diagnosis not present

## 2020-12-15 DIAGNOSIS — N183 Chronic kidney disease, stage 3 unspecified: Secondary | ICD-10-CM | POA: Diagnosis not present

## 2020-12-15 DIAGNOSIS — I10 Essential (primary) hypertension: Secondary | ICD-10-CM | POA: Diagnosis not present

## 2020-12-15 DIAGNOSIS — G4733 Obstructive sleep apnea (adult) (pediatric): Secondary | ICD-10-CM | POA: Diagnosis not present

## 2020-12-15 DIAGNOSIS — E875 Hyperkalemia: Secondary | ICD-10-CM | POA: Diagnosis not present

## 2020-12-15 DIAGNOSIS — R809 Proteinuria, unspecified: Secondary | ICD-10-CM | POA: Diagnosis not present

## 2020-12-15 DIAGNOSIS — E785 Hyperlipidemia, unspecified: Secondary | ICD-10-CM | POA: Diagnosis not present

## 2020-12-15 DIAGNOSIS — E559 Vitamin D deficiency, unspecified: Secondary | ICD-10-CM | POA: Diagnosis not present

## 2020-12-17 ENCOUNTER — Encounter (HOSPITAL_BASED_OUTPATIENT_CLINIC_OR_DEPARTMENT_OTHER): Payer: Self-pay

## 2020-12-17 ENCOUNTER — Emergency Department (HOSPITAL_COMMUNITY): Payer: Medicare HMO

## 2020-12-17 ENCOUNTER — Emergency Department (HOSPITAL_BASED_OUTPATIENT_CLINIC_OR_DEPARTMENT_OTHER): Payer: Medicare HMO

## 2020-12-17 ENCOUNTER — Other Ambulatory Visit: Payer: Self-pay

## 2020-12-17 ENCOUNTER — Emergency Department (HOSPITAL_BASED_OUTPATIENT_CLINIC_OR_DEPARTMENT_OTHER)
Admission: EM | Admit: 2020-12-17 | Discharge: 2020-12-18 | Disposition: A | Discharge status: Home / Self Care | Payer: Medicare HMO | Attending: Emergency Medicine | Admitting: Emergency Medicine

## 2020-12-17 DIAGNOSIS — I129 Hypertensive chronic kidney disease with stage 1 through stage 4 chronic kidney disease, or unspecified chronic kidney disease: Secondary | ICD-10-CM | POA: Insufficient documentation

## 2020-12-17 DIAGNOSIS — M4322 Fusion of spine, cervical region: Secondary | ICD-10-CM | POA: Diagnosis not present

## 2020-12-17 DIAGNOSIS — Z20822 Contact with and (suspected) exposure to covid-19: Secondary | ICD-10-CM | POA: Diagnosis not present

## 2020-12-17 DIAGNOSIS — R000 Tachycardia, unspecified: Secondary | ICD-10-CM | POA: Diagnosis not present

## 2020-12-17 DIAGNOSIS — D72829 Elevated white blood cell count, unspecified: Secondary | ICD-10-CM | POA: Diagnosis not present

## 2020-12-17 DIAGNOSIS — R109 Unspecified abdominal pain: Secondary | ICD-10-CM | POA: Diagnosis not present

## 2020-12-17 DIAGNOSIS — R509 Fever, unspecified: Secondary | ICD-10-CM | POA: Diagnosis not present

## 2020-12-17 DIAGNOSIS — Z981 Arthrodesis status: Secondary | ICD-10-CM | POA: Diagnosis not present

## 2020-12-17 DIAGNOSIS — E875 Hyperkalemia: Secondary | ICD-10-CM | POA: Diagnosis not present

## 2020-12-17 DIAGNOSIS — Z79899 Other long term (current) drug therapy: Secondary | ICD-10-CM | POA: Diagnosis not present

## 2020-12-17 DIAGNOSIS — N281 Cyst of kidney, acquired: Secondary | ICD-10-CM | POA: Diagnosis not present

## 2020-12-17 DIAGNOSIS — M546 Pain in thoracic spine: Secondary | ICD-10-CM | POA: Diagnosis not present

## 2020-12-17 DIAGNOSIS — I700 Atherosclerosis of aorta: Secondary | ICD-10-CM | POA: Diagnosis not present

## 2020-12-17 DIAGNOSIS — N189 Chronic kidney disease, unspecified: Secondary | ICD-10-CM | POA: Insufficient documentation

## 2020-12-17 DIAGNOSIS — M545 Low back pain, unspecified: Secondary | ICD-10-CM | POA: Diagnosis not present

## 2020-12-17 DIAGNOSIS — K4020 Bilateral inguinal hernia, without obstruction or gangrene, not specified as recurrent: Secondary | ICD-10-CM | POA: Diagnosis not present

## 2020-12-17 DIAGNOSIS — M549 Dorsalgia, unspecified: Secondary | ICD-10-CM | POA: Diagnosis not present

## 2020-12-17 DIAGNOSIS — E1122 Type 2 diabetes mellitus with diabetic chronic kidney disease: Secondary | ICD-10-CM | POA: Insufficient documentation

## 2020-12-17 DIAGNOSIS — Z0389 Encounter for observation for other suspected diseases and conditions ruled out: Secondary | ICD-10-CM | POA: Diagnosis not present

## 2020-12-17 LAB — HEPATIC FUNCTION PANEL
ALT: 27 U/L (ref 0–44)
AST: 23 U/L (ref 15–41)
Albumin: 4.1 g/dL (ref 3.5–5.0)
Alkaline Phosphatase: 62 U/L (ref 38–126)
Bilirubin, Direct: 0.1 mg/dL (ref 0.0–0.2)
Indirect Bilirubin: 0.6 mg/dL (ref 0.3–0.9)
Total Bilirubin: 0.7 mg/dL (ref 0.3–1.2)
Total Protein: 7.3 g/dL (ref 6.5–8.1)

## 2020-12-17 LAB — URINALYSIS, ROUTINE W REFLEX MICROSCOPIC
Bilirubin Urine: NEGATIVE
Glucose, UA: NEGATIVE mg/dL
Ketones, ur: NEGATIVE mg/dL
Leukocytes,Ua: NEGATIVE
Nitrite: NEGATIVE
Protein, ur: >300 mg/dL — AB
Specific Gravity, Urine: 1.025 (ref 1.005–1.030)
pH: 6 (ref 5.0–8.0)

## 2020-12-17 LAB — BASIC METABOLIC PANEL
Anion gap: 7 (ref 5–15)
BUN: 45 mg/dL — ABNORMAL HIGH (ref 8–23)
CO2: 25 mmol/L (ref 22–32)
Calcium: 9.7 mg/dL (ref 8.9–10.3)
Chloride: 103 mmol/L (ref 98–111)
Creatinine, Ser: 2.47 mg/dL — ABNORMAL HIGH (ref 0.61–1.24)
GFR, Estimated: 29 mL/min — ABNORMAL LOW (ref >60)
Glucose, Bld: 111 mg/dL — ABNORMAL HIGH (ref 70–99)
Potassium: 5.9 mmol/L — ABNORMAL HIGH (ref 3.5–5.1)
Sodium: 135 mmol/L (ref 135–145)

## 2020-12-17 LAB — CBC WITH DIFFERENTIAL/PLATELET
Abs Immature Granulocytes: 0.19 10*3/uL — ABNORMAL HIGH (ref 0.00–0.07)
Basophils Absolute: 0.1 10*3/uL (ref 0.0–0.1)
Basophils Relative: 0 %
Eosinophils Absolute: 0.3 10*3/uL (ref 0.0–0.5)
Eosinophils Relative: 1 %
HCT: 41.9 % (ref 39.0–52.0)
Hemoglobin: 13.7 g/dL (ref 13.0–17.0)
Immature Granulocytes: 1 %
Lymphocytes Relative: 4 %
Lymphs Abs: 0.9 10*3/uL (ref 0.7–4.0)
MCH: 29.8 pg (ref 26.0–34.0)
MCHC: 32.7 g/dL (ref 30.0–36.0)
MCV: 91.1 fL (ref 80.0–100.0)
Monocytes Absolute: 1.2 10*3/uL — ABNORMAL HIGH (ref 0.1–1.0)
Monocytes Relative: 6 %
Neutro Abs: 17.4 10*3/uL — ABNORMAL HIGH (ref 1.7–7.7)
Neutrophils Relative %: 88 %
Platelets: 202 10*3/uL (ref 150–400)
RBC: 4.6 MIL/uL (ref 4.22–5.81)
RDW: 14.6 % (ref 11.5–15.5)
WBC: 20.1 10*3/uL — ABNORMAL HIGH (ref 4.0–10.5)
nRBC: 0 % (ref 0.0–0.2)

## 2020-12-17 LAB — LACTIC ACID, PLASMA: Lactic Acid, Venous: 0.8 mmol/L (ref 0.5–1.9)

## 2020-12-17 LAB — RESP PANEL BY RT-PCR (FLU A&B, COVID) ARPGX2
Influenza A by PCR: NEGATIVE
Influenza B by PCR: NEGATIVE
SARS Coronavirus 2 by RT PCR: NEGATIVE

## 2020-12-17 LAB — SEDIMENTATION RATE: Sed Rate: 31 mm/h — ABNORMAL HIGH (ref 0–16)

## 2020-12-17 LAB — URINALYSIS, MICROSCOPIC (REFLEX)

## 2020-12-17 LAB — C-REACTIVE PROTEIN: CRP: 1.3 mg/dL — ABNORMAL HIGH (ref <1.0)

## 2020-12-17 MED ORDER — GADOBUTROL 1 MMOL/ML IV SOLN
10.0000 mL | Freq: Once | INTRAVENOUS | Status: AC | PRN
  Administered 2020-12-17: 19:00:00 10 mL via INTRAVENOUS

## 2020-12-17 MED ORDER — IBUPROFEN 800 MG PO TABS
800.0000 mg | ORAL_TABLET | Freq: Once | ORAL | Status: AC
  Administered 2020-12-17: 13:00:00 800 mg via ORAL
  Filled 2020-12-17: qty 1

## 2020-12-17 NOTE — ED Provider Notes (Signed)
[facility] EMERGENCY DEPARTMENT Provider Note   CSN: [id number] Arrival date & time: 12/17/20  1200     History Chief Complaint  Patient presents with   Back Pain    Peter Bryan is a 60 y.o. male who presents emergency department chief complaint of flulike symptoms.  Patient states that this morning he was at a store and between 9 and 11 AM he began having onset of myalgias, back pain, right-sided groin pain and shaking chills which got progressively worse.  He states that he did not have a fever earlier but by the time he got here he was febrile.  He has a history of chronic back pain and previous back surgeries last one in 2018.  This is in his thoracic or lumbar region.  Patient has pain in his bilateral sacroiliac region.  He denies any urinary symptoms at this time and had a UA about 2 days ago with his kidney doctor.  He denies cough, hematuria, frequency urgency or history of kidney stones.  He feels very uncomfortable.  He did not take anything prior to arrival.  He has no recent procedures or injections to his back.  The history is provided by the patient.  Back Pain Location:  Thoracic spine and lumbar spine     Past Medical History:  Diagnosis Date   Chronic kidney disease    Diabetes mellitus without complication ([place])    Hypertension    Neuromuscular disorder ([place])    neuropathy feet   Obesity    Sleep apnea     Patient Active Problem List   Diagnosis Date Noted   OBSTRUCTIVE SLEEP APNEA 10/16/2009   OBESITY 03/13/2007   SLEEP APNEA 03/10/2007    Past Surgical History:  Procedure Laterality Date   BACK SURGERY  1/92-08/2016   multiple levels. C5-S1   CARPAL TUNNEL RELEASE     CERVICAL SPINE SURGERY     ROTATOR CUFF REPAIR     SHOULDER ARTHROSCOPY WITH OPEN ROTATOR CUFF REPAIR AND DISTAL CLAVICLE ACROMINECTOMY Right 03/16/2019   Procedure: SHOULDER ARTHROSCOPY WITH OPEN ROTATOR CUFF REPAIR AND DISTAL CLAVICLE ACROMINECTOMY;  Surgeon: Earlie Server, MD;  Location: [facility];  Service: Orthopedics;  Laterality: Right;       No family history on file.  Social History   Tobacco Use   Smoking status: Never   Smokeless tobacco: Never  Vaping Use   Vaping Use: Never used  Substance Use Topics   Alcohol use: Never   Drug use: Never    Home Medications Prior to Admission medications   Medication Sig Start Date End Date Taking? Authorizing Provider  allopurinol (ZYLOPRIM) 300 MG tablet Take 300 mg by mouth every evening. 1800    [provider]  amLODipine (NORVASC) 5 MG tablet Take 5 mg by mouth daily. 02/15/19   [provider]  baclofen (LIORESAL) 10 MG tablet Take 10 mg by mouth daily as needed for muscle spasms.    [provider]  Cholecalciferol (QC VITAMIN D3) 50 MCG (2000 UT) TABS Take 2,000 Units by mouth every evening.    [provider]  gabapentin (NEURONTIN) 300 MG capsule Take 300 mg by mouth 4 (four) times daily. 07/22/08   [provider]  HYDROcodone-acetaminophen (NORCO) 10-325 MG per tablet Take 2-2.5 tablets by mouth See admin instructions. Take 2.5 mg in the morning and at lunch as 2 tables in the evening    [provider]  rosuvastatin (CRESTOR) 5 MG tablet Take  5 mg by mouth daily.      [provider]  SAVELLA 50 MG TABS tablet Take 50 mg by mouth 2 (two) times daily. Take at 12 noon and Midnight 02/06/19   [provider]  sitaGLIPtin (JANUVIA) 25 MG tablet Take 25 mg by mouth daily.      [provider]  tadalafil (CIALIS) 10 MG tablet Take 10 mg by mouth daily.  12/05/18   [provider]  testosterone cypionate (DEPOTESTOSTERONE CYPIONATE) 200 MG/ML injection Inject 200 mg into the muscle once a week. 02/24/19   [provider]  valsartan-hydrochlorothiazide (DIOVAN-HCT) 320-25 MG tablet Take 1 tablet by mouth at bedtime. Midnight    [provider]  zolpidem (AMBIEN) 10 MG tablet Take 10 mg by mouth  at bedtime.  01/31/19   [provider]    Allergies    Patient has no known allergies.  Review of Systems   Review of Systems  Musculoskeletal:  Positive for back pain.  Ten systems reviewed and are negative for acute change, except as noted in the HPI.   Physical Exam Updated Vital Signs BP (!) 122/55 (BP Location: Right Arm)    Pulse 98    Temp 98 F (36.7 C) (Oral)    Resp (!) 24    Ht 5\' 11"  (1.803 m)    Wt (!) 157.4 kg    SpO2 93%    BMI 48.40 kg/m   Physical Exam Vitals and nursing note reviewed.  Constitutional:      General: He is not in acute distress.    Appearance: He is well-developed. He is ill-appearing. He is not toxic-appearing or diaphoretic.  HENT:     Head: Normocephalic and atraumatic.  Eyes:     General: No scleral icterus.    Conjunctiva/sclera: Conjunctivae normal.  Cardiovascular:     Rate and Rhythm: Normal rate and regular rhythm.     Heart sounds: Normal heart sounds.  Pulmonary:     Effort: Pulmonary effort is normal. No respiratory distress.     Breath sounds: Normal breath sounds.  Abdominal:     Palpations: Abdomen is soft.     Tenderness: There is no abdominal tenderness. There is no right CVA tenderness or left CVA tenderness.  Musculoskeletal:     Cervical back: Normal range of motion and neck supple.  Skin:    General: Skin is warm and dry.  Neurological:     General: No focal deficit present.     Mental Status: He is alert.  Psychiatric:        Behavior: Behavior normal.    ED Results / Procedures / Treatments   Labs (all labs ordered are listed, but only abnormal results are displayed) Labs Reviewed  CBC WITH DIFFERENTIAL/PLATELET - Abnormal; Notable for the following components:      Result Value   WBC 20.1 (*)    Neutro Abs 17.4 (*)    Monocytes Absolute 1.2 (*)    Abs Immature Granulocytes 0.19 (*)    All other components within normal limits  BASIC METABOLIC PANEL - Abnormal; Notable for the following  components:   Potassium 5.9 (*)    Glucose, Bld 111 (*)    BUN 45 (*)    Creatinine, Ser 2.47 (*)    GFR, Estimated 29 (*)    All other components within normal limits  URINALYSIS, ROUTINE W REFLEX MICROSCOPIC - Abnormal; Notable for the following components:   Hgb urine dipstick TRACE (*)  Protein, ur >300 (*)    All other components within normal limits  SEDIMENTATION RATE - Abnormal; Notable for the following components:   Sed Rate 31 (*)    All other components within normal limits  URINALYSIS, MICROSCOPIC (REFLEX) - Abnormal; Notable for the following components:   Bacteria, UA FEW (*)    All other components within normal limits  RESP PANEL BY RT-PCR (FLU A&B, COVID) ARPGX2  CULTURE, BLOOD (ROUTINE X 2)  CULTURE, BLOOD (ROUTINE X 2)  LACTIC ACID, PLASMA  HEPATIC FUNCTION PANEL  C-REACTIVE PROTEIN  PROTIME-INR  APTT    EKG EKG Interpretation  Date/Time:  Wednesday December 17 2020 14:33:47 EST Ventricular Rate:  107 PR Interval:  149 QRS Duration: 100 QT Interval:  291 QTC Calculation: 389 R Axis:   20 Text Interpretation: Sinus tachycardia Confirmed by Madalyn Rob [phone]) on 12/17/2020 3:23:18 PM  Radiology DG Chest Port 1 View  Result Date: 12/17/2020 CLINICAL DATA:  Questionable sepsis EXAM: PORTABLE CHEST 1 VIEW COMPARISON:  None. FINDINGS: Normal mediastinum and cardiac silhouette. Normal pulmonary vasculature. No evidence of effusion, infiltrate, or pneumothorax. No acute bony abnormality. Anterior cervical fusion and posterior cervical fusion IMPRESSION: No acute cardiopulmonary process. Electronically Signed   By: Suzy Bouchard M.D.   On: 12/17/2020 14:57   CT Renal Stone Study  Result Date: 12/17/2020 CLINICAL DATA:  Flank pain, suspected kidney stone in a 60 year old male. EXAM: CT ABDOMEN AND PELVIS WITHOUT CONTRAST TECHNIQUE: Multidetector CT imaging of the abdomen and pelvis was performed following the standard protocol without IV contrast.  COMPARISON:  February 06, 2003. FINDINGS: Lower chest: Unremarkable. Hepatobiliary: Smooth hepatic contours. No pericholecystic stranding or gross biliary duct distension. Pancreas: No signs of peripancreatic inflammation or pancreatic contour abnormality. Spleen: Normal in size and contour. Adrenals/Urinary Tract: Adrenal glands are normal. Mild bilateral perinephric stranding is symmetric and potentially chronic. Moderate size 4 x 3.7 cm cyst arises from the upper pole of the RIGHT kidney. No signs of renal or ureteral calculus. No hydronephrosis. No perivesical stranding. Stomach/Bowel: No acute gastrointestinal process. Appendix appendix not visualized, no secondary signs to suggest appendicitis, no stranding in the area of the cecum. No small bowel dilation or stranding adjacent to small bowel loops. Vascular/Lymphatic: Aortic atherosclerosis. No sign of aneurysm. Smooth contour of the IVC. There is no gastrohepatic or hepatoduodenal ligament lymphadenopathy. No retroperitoneal or mesenteric lymphadenopathy. No pelvic sidewall lymphadenopathy. Reproductive: Unremarkable by CT. Other: Bilateral fat containing inguinal hernias.  No ascites. Musculoskeletal: Lumbosacral spinal fusion with extension across the sacroiliac joint of fusion elements since the study of November of 2016. Extensive spinal degenerative changes also seated with areas of post laminectomy change. No destructive bone process or acute bone finding. Fracture of fusion rods at the L4-5 transition versus resection of rods and or revision since previous imaging. Discontinuous rods at L4-5 on both the LEFT and RIGHT Rods are intact below this level extending across levels of fusion at S1 and extension into sacroiliac joints. Rides did appear continuous on remote imaging following revision of spinal fusion and extension across the sacroiliac joint from December of 2016. Discontinuous rod also in the mid lumbar spine on the RIGHT at the L3 level  IMPRESSION: No acute intra-abdominal or pelvic findings. Bilateral fat containing inguinal hernias. Extensive lumbosacral spinal fusion with areas of discontinuity within rods associated with spinal fusion at the L3 and L5-S1 levels. This appears to indicate hardware complication/rod fracture but without associated acute bony abnormality. Chronicity uncertain. Correlate with any worsening  back pain or symptoms. Correlation with more recent postoperative imaging than 2016 could be helpful. Aortic atherosclerosis. Aortic Atherosclerosis (ICD10-I70.0). Electronically Signed   By: Zetta Bills M.D.   On: 12/17/2020 13:55    Procedures Procedures   Medications Ordered in ED Medications  ibuprofen (ADVIL) tablet 800 mg (800 mg Oral Given 12/17/20 1242)    ED Course  I have reviewed the triage vital signs and the nursing notes.  Pertinent labs & imaging results that were available during my care of the patient were reviewed by me and considered in my medical decision making (see chart for details).    MDM Rules/Calculators/A&P                           61 year old male here with onset of flulike symptoms, shaking chills, fever and back pain.  Patient's symptoms were acute this morning.  He has a history of chronic back pain.  Patient has a known history of CKD and his BMP is reflective of that.  His creatinine is at baseline.  He has mild hyperkalemia and this appears to be his baseline as well on review of EMR.  Patient's CBC shows a white blood cell count of 20.1 thousand with a left shift.  Sed rate is mildly elevated.  Patient's urine shows no acute abnormalities.  I ordered and reviewed images including a CT renal stone study which showed evidence of old hardware abnormalities.  I discussed these findings with Dr. Rennis Harding who is the patient's surgeon.  They aware of these findings.  He states that if there are any concerns for infection in the hardware he is willing to be contacted. Patient's  chest x-ray also shows no acute abnormalities.  I reviewed these images as well.  Given the patient's fever, chills, incredibly high white count and unknown source I feel that he will need further evaluation to rule out infection as he has point tenderness in the midline of his spine in the thoracolumbar region.  She was also seen and shared visit with Dr. Roslynn Amble who is in agreement.  Patient will be transferred to [facility].  I placed an MRI with and without contrast of the thoracic thoracic and lumbar spine.  Dr. Langston Masker has excepted the patient in transfer. Final Clinical Impression(s) / ED Diagnoses Final diagnoses:  None    Rx / DC Orders ED Discharge Orders     None        Margarita Mail, PA-C 12/17/20 [place], [place], DO 12/17/20 2019

## 2020-12-17 NOTE — ED Notes (Signed)
Pt called x3 for room, no response. Moving pt OTF.

## 2020-12-17 NOTE — ED Triage Notes (Signed)
Pt c/o onset chills, lower back pain and right groin pain-sx started ~930am-now having fever-NAD-to triage in w/c

## 2020-12-18 MED ORDER — LOKELMA 5 G PO PACK: 10.0000 g | PACK | Freq: Three times a day (TID) | ORAL | 0 refills | Status: AC

## 2020-12-18 MED ORDER — SODIUM ZIRCONIUM CYCLOSILICATE 10 G PO PACK
10.0000 g | PACK | Freq: Once | ORAL | Status: AC
  Administered 2020-12-18: 10 g via ORAL
  Filled 2020-12-18: qty 1

## 2020-12-18 MED ORDER — ACETAMINOPHEN 500 MG PO TABS
1000.0000 mg | ORAL_TABLET | Freq: Once | ORAL | Status: AC
  Administered 2020-12-18: 1000 mg via ORAL
  Filled 2020-12-18: qty 2

## 2020-12-18 NOTE — ED Notes (Signed)
ED Provider at bedside. 

## 2020-12-19 ENCOUNTER — Telehealth (HOSPITAL_BASED_OUTPATIENT_CLINIC_OR_DEPARTMENT_OTHER): Payer: Self-pay | Admitting: Emergency Medicine

## 2020-12-19 LAB — BLOOD CULTURE ID PANEL (REFLEXED) - BCID2

## 2020-12-19 NOTE — Telephone Encounter (Signed)
Received results for blood cultures. Consulted with Dr. Florina Ou who advised to follow up with pt and if pt is feeling better no further action. However, if pt is not feeling better, he should return to ED. Will have day shift follow up with pt.

## 2020-12-21 LAB — CULTURE, BLOOD (ROUTINE X 2): Special Requests: ADEQUATE

## 2020-12-22 LAB — CULTURE, BLOOD (ROUTINE X 2)
Culture: NO GROWTH
Special Requests: ADEQUATE

## 2020-12-26 DIAGNOSIS — N453 Epididymo-orchitis: Secondary | ICD-10-CM | POA: Diagnosis not present

## 2020-12-26 DIAGNOSIS — N400 Enlarged prostate without lower urinary tract symptoms: Secondary | ICD-10-CM | POA: Diagnosis not present

## 2020-12-26 DIAGNOSIS — E291 Testicular hypofunction: Secondary | ICD-10-CM | POA: Diagnosis not present

## 2020-12-26 DIAGNOSIS — N529 Male erectile dysfunction, unspecified: Secondary | ICD-10-CM | POA: Diagnosis not present

## 2020-12-26 DIAGNOSIS — N5089 Other specified disorders of the male genital organs: Secondary | ICD-10-CM | POA: Diagnosis not present

## 2020-12-31 DIAGNOSIS — N433 Hydrocele, unspecified: Secondary | ICD-10-CM | POA: Diagnosis not present

## 2020-12-31 DIAGNOSIS — N453 Epididymo-orchitis: Secondary | ICD-10-CM | POA: Diagnosis not present

## 2020-12-31 DIAGNOSIS — I861 Scrotal varices: Secondary | ICD-10-CM | POA: Diagnosis not present

## 2020-12-31 DIAGNOSIS — N5089 Other specified disorders of the male genital organs: Secondary | ICD-10-CM | POA: Diagnosis not present

## 2020-12-31 DIAGNOSIS — N503 Cyst of epididymis: Secondary | ICD-10-CM | POA: Diagnosis not present

## 2020-12-31 DIAGNOSIS — K4020 Bilateral inguinal hernia, without obstruction or gangrene, not specified as recurrent: Secondary | ICD-10-CM | POA: Diagnosis not present

## 2021-01-08 DIAGNOSIS — N5089 Other specified disorders of the male genital organs: Secondary | ICD-10-CM | POA: Diagnosis not present

## 2021-01-08 DIAGNOSIS — R7989 Other specified abnormal findings of blood chemistry: Secondary | ICD-10-CM | POA: Diagnosis not present

## 2021-01-08 DIAGNOSIS — N400 Enlarged prostate without lower urinary tract symptoms: Secondary | ICD-10-CM | POA: Diagnosis not present

## 2021-01-08 DIAGNOSIS — E291 Testicular hypofunction: Secondary | ICD-10-CM | POA: Diagnosis not present

## 2021-01-08 DIAGNOSIS — N529 Male erectile dysfunction, unspecified: Secondary | ICD-10-CM | POA: Diagnosis not present

## 2021-01-08 DIAGNOSIS — Z87438 Personal history of other diseases of male genital organs: Secondary | ICD-10-CM | POA: Diagnosis not present

## 2021-01-08 DIAGNOSIS — K4020 Bilateral inguinal hernia, without obstruction or gangrene, not specified as recurrent: Secondary | ICD-10-CM | POA: Diagnosis not present

## 2021-01-27 DIAGNOSIS — Z20822 Contact with and (suspected) exposure to covid-19: Secondary | ICD-10-CM | POA: Diagnosis not present

## 2021-01-27 DIAGNOSIS — J209 Acute bronchitis, unspecified: Secondary | ICD-10-CM | POA: Diagnosis not present

## 2021-01-27 DIAGNOSIS — R059 Cough, unspecified: Secondary | ICD-10-CM | POA: Diagnosis not present

## 2021-02-02 DIAGNOSIS — M4804 Spinal stenosis, thoracic region: Secondary | ICD-10-CM | POA: Diagnosis not present

## 2021-02-02 DIAGNOSIS — M4714 Other spondylosis with myelopathy, thoracic region: Secondary | ICD-10-CM | POA: Diagnosis not present

## 2021-02-02 DIAGNOSIS — M4010 Other secondary kyphosis, site unspecified: Secondary | ICD-10-CM | POA: Diagnosis not present

## 2021-02-02 DIAGNOSIS — I10 Essential (primary) hypertension: Secondary | ICD-10-CM | POA: Diagnosis not present

## 2021-02-20 DIAGNOSIS — M4804 Spinal stenosis, thoracic region: Secondary | ICD-10-CM | POA: Diagnosis not present

## 2021-02-20 DIAGNOSIS — Z79899 Other long term (current) drug therapy: Secondary | ICD-10-CM | POA: Diagnosis not present

## 2021-02-20 DIAGNOSIS — M4714 Other spondylosis with myelopathy, thoracic region: Secondary | ICD-10-CM | POA: Diagnosis not present

## 2021-02-20 DIAGNOSIS — Z79891 Long term (current) use of opiate analgesic: Secondary | ICD-10-CM | POA: Diagnosis not present

## 2021-02-20 DIAGNOSIS — M4010 Other secondary kyphosis, site unspecified: Secondary | ICD-10-CM | POA: Diagnosis not present

## 2021-02-20 DIAGNOSIS — G894 Chronic pain syndrome: Secondary | ICD-10-CM | POA: Diagnosis not present

## 2021-02-20 DIAGNOSIS — I10 Essential (primary) hypertension: Secondary | ICD-10-CM | POA: Diagnosis not present

## 2021-03-02 DIAGNOSIS — E119 Type 2 diabetes mellitus without complications: Secondary | ICD-10-CM | POA: Diagnosis not present

## 2021-03-02 DIAGNOSIS — M5106 Intervertebral disc disorders with myelopathy, lumbar region: Secondary | ICD-10-CM | POA: Diagnosis not present

## 2021-03-02 DIAGNOSIS — Z981 Arthrodesis status: Secondary | ICD-10-CM | POA: Diagnosis not present

## 2021-03-02 DIAGNOSIS — Z79899 Other long term (current) drug therapy: Secondary | ICD-10-CM | POA: Diagnosis not present

## 2021-03-02 DIAGNOSIS — M5134 Other intervertebral disc degeneration, thoracic region: Secondary | ICD-10-CM | POA: Diagnosis not present

## 2021-03-02 DIAGNOSIS — E785 Hyperlipidemia, unspecified: Secondary | ICD-10-CM | POA: Diagnosis not present

## 2021-03-02 DIAGNOSIS — M5416 Radiculopathy, lumbar region: Secondary | ICD-10-CM | POA: Diagnosis not present

## 2021-03-02 DIAGNOSIS — M4804 Spinal stenosis, thoracic region: Secondary | ICD-10-CM | POA: Diagnosis not present

## 2021-03-02 DIAGNOSIS — M109 Gout, unspecified: Secondary | ICD-10-CM | POA: Diagnosis not present

## 2021-03-02 DIAGNOSIS — M4324 Fusion of spine, thoracic region: Secondary | ICD-10-CM | POA: Diagnosis not present

## 2021-03-02 DIAGNOSIS — M4714 Other spondylosis with myelopathy, thoracic region: Secondary | ICD-10-CM | POA: Diagnosis not present

## 2021-03-02 DIAGNOSIS — M40204 Unspecified kyphosis, thoracic region: Secondary | ICD-10-CM | POA: Diagnosis not present

## 2021-03-02 DIAGNOSIS — M532X4 Spinal instabilities, thoracic region: Secondary | ICD-10-CM | POA: Diagnosis not present

## 2021-03-02 DIAGNOSIS — I10 Essential (primary) hypertension: Secondary | ICD-10-CM | POA: Diagnosis not present

## 2021-03-02 DIAGNOSIS — M4724 Other spondylosis with radiculopathy, thoracic region: Secondary | ICD-10-CM | POA: Diagnosis not present

## 2021-03-02 DIAGNOSIS — M4010 Other secondary kyphosis, site unspecified: Secondary | ICD-10-CM | POA: Diagnosis not present

## 2021-03-04 DIAGNOSIS — R2689 Other abnormalities of gait and mobility: Secondary | ICD-10-CM | POA: Diagnosis not present

## 2021-03-04 DIAGNOSIS — R531 Weakness: Secondary | ICD-10-CM | POA: Diagnosis not present

## 2021-03-17 DIAGNOSIS — G4733 Obstructive sleep apnea (adult) (pediatric): Secondary | ICD-10-CM | POA: Diagnosis not present

## 2021-03-23 DIAGNOSIS — K5900 Constipation, unspecified: Secondary | ICD-10-CM | POA: Diagnosis not present

## 2021-03-23 DIAGNOSIS — M4804 Spinal stenosis, thoracic region: Secondary | ICD-10-CM | POA: Diagnosis not present

## 2021-03-23 DIAGNOSIS — R69 Illness, unspecified: Secondary | ICD-10-CM | POA: Diagnosis not present

## 2021-03-24 DIAGNOSIS — E1142 Type 2 diabetes mellitus with diabetic polyneuropathy: Secondary | ICD-10-CM | POA: Diagnosis not present

## 2021-03-24 DIAGNOSIS — Z7984 Long term (current) use of oral hypoglycemic drugs: Secondary | ICD-10-CM | POA: Diagnosis not present

## 2021-03-24 DIAGNOSIS — M21372 Foot drop, left foot: Secondary | ICD-10-CM | POA: Diagnosis not present

## 2021-03-26 DIAGNOSIS — G4733 Obstructive sleep apnea (adult) (pediatric): Secondary | ICD-10-CM | POA: Diagnosis not present

## 2021-03-26 DIAGNOSIS — E785 Hyperlipidemia, unspecified: Secondary | ICD-10-CM | POA: Diagnosis not present

## 2021-03-26 DIAGNOSIS — M109 Gout, unspecified: Secondary | ICD-10-CM | POA: Diagnosis not present

## 2021-03-26 DIAGNOSIS — E1122 Type 2 diabetes mellitus with diabetic chronic kidney disease: Secondary | ICD-10-CM | POA: Diagnosis not present

## 2021-03-26 DIAGNOSIS — Z6841 Body Mass Index (BMI) 40.0 and over, adult: Secondary | ICD-10-CM | POA: Diagnosis not present

## 2021-03-26 DIAGNOSIS — E1142 Type 2 diabetes mellitus with diabetic polyneuropathy: Secondary | ICD-10-CM | POA: Diagnosis not present

## 2021-03-26 DIAGNOSIS — G8929 Other chronic pain: Secondary | ICD-10-CM | POA: Diagnosis not present

## 2021-03-26 DIAGNOSIS — G4700 Insomnia, unspecified: Secondary | ICD-10-CM | POA: Diagnosis not present

## 2021-03-26 DIAGNOSIS — I10 Essential (primary) hypertension: Secondary | ICD-10-CM | POA: Diagnosis not present

## 2021-03-26 DIAGNOSIS — M1990 Unspecified osteoarthritis, unspecified site: Secondary | ICD-10-CM | POA: Diagnosis not present

## 2021-03-26 DIAGNOSIS — E291 Testicular hypofunction: Secondary | ICD-10-CM | POA: Diagnosis not present

## 2021-03-30 DIAGNOSIS — M546 Pain in thoracic spine: Secondary | ICD-10-CM | POA: Diagnosis not present

## 2021-04-14 DIAGNOSIS — N5089 Other specified disorders of the male genital organs: Secondary | ICD-10-CM | POA: Diagnosis not present

## 2021-04-14 DIAGNOSIS — E291 Testicular hypofunction: Secondary | ICD-10-CM | POA: Diagnosis not present

## 2021-04-14 DIAGNOSIS — N529 Male erectile dysfunction, unspecified: Secondary | ICD-10-CM | POA: Diagnosis not present

## 2021-04-14 DIAGNOSIS — N401 Enlarged prostate with lower urinary tract symptoms: Secondary | ICD-10-CM | POA: Diagnosis not present

## 2021-04-14 DIAGNOSIS — Z87438 Personal history of other diseases of male genital organs: Secondary | ICD-10-CM | POA: Diagnosis not present

## 2021-04-14 DIAGNOSIS — N138 Other obstructive and reflux uropathy: Secondary | ICD-10-CM | POA: Diagnosis not present

## 2021-04-14 DIAGNOSIS — K4020 Bilateral inguinal hernia, without obstruction or gangrene, not specified as recurrent: Secondary | ICD-10-CM | POA: Diagnosis not present

## 2021-04-14 DIAGNOSIS — R7989 Other specified abnormal findings of blood chemistry: Secondary | ICD-10-CM | POA: Diagnosis not present

## 2021-04-17 DIAGNOSIS — N5082 Scrotal pain: Secondary | ICD-10-CM | POA: Diagnosis not present

## 2021-04-17 DIAGNOSIS — N433 Hydrocele, unspecified: Secondary | ICD-10-CM | POA: Diagnosis not present

## 2021-04-20 DIAGNOSIS — D649 Anemia, unspecified: Secondary | ICD-10-CM | POA: Diagnosis not present

## 2021-04-20 DIAGNOSIS — E1169 Type 2 diabetes mellitus with other specified complication: Secondary | ICD-10-CM | POA: Diagnosis not present

## 2021-04-20 DIAGNOSIS — G4733 Obstructive sleep apnea (adult) (pediatric): Secondary | ICD-10-CM | POA: Diagnosis not present

## 2021-04-20 DIAGNOSIS — E211 Secondary hyperparathyroidism, not elsewhere classified: Secondary | ICD-10-CM | POA: Diagnosis not present

## 2021-04-20 DIAGNOSIS — I10 Essential (primary) hypertension: Secondary | ICD-10-CM | POA: Diagnosis not present

## 2021-04-20 DIAGNOSIS — M1000 Idiopathic gout, unspecified site: Secondary | ICD-10-CM | POA: Diagnosis not present

## 2021-04-20 DIAGNOSIS — E669 Obesity, unspecified: Secondary | ICD-10-CM | POA: Diagnosis not present

## 2021-04-20 DIAGNOSIS — R809 Proteinuria, unspecified: Secondary | ICD-10-CM | POA: Diagnosis not present

## 2021-04-20 DIAGNOSIS — N183 Chronic kidney disease, stage 3 unspecified: Secondary | ICD-10-CM | POA: Diagnosis not present

## 2021-04-20 DIAGNOSIS — N189 Chronic kidney disease, unspecified: Secondary | ICD-10-CM | POA: Diagnosis not present

## 2021-04-20 DIAGNOSIS — M109 Gout, unspecified: Secondary | ICD-10-CM | POA: Diagnosis not present

## 2021-04-20 DIAGNOSIS — E559 Vitamin D deficiency, unspecified: Secondary | ICD-10-CM | POA: Diagnosis not present

## 2021-04-20 DIAGNOSIS — E785 Hyperlipidemia, unspecified: Secondary | ICD-10-CM | POA: Diagnosis not present

## 2021-04-20 DIAGNOSIS — E875 Hyperkalemia: Secondary | ICD-10-CM | POA: Diagnosis not present

## 2021-04-21 ENCOUNTER — Encounter (HOSPITAL_BASED_OUTPATIENT_CLINIC_OR_DEPARTMENT_OTHER): Payer: Self-pay | Admitting: Emergency Medicine

## 2021-04-21 DIAGNOSIS — Z7984 Long term (current) use of oral hypoglycemic drugs: Secondary | ICD-10-CM | POA: Insufficient documentation

## 2021-04-21 DIAGNOSIS — E876 Hypokalemia: Secondary | ICD-10-CM | POA: Insufficient documentation

## 2021-04-21 DIAGNOSIS — Z0000 Encounter for general adult medical examination without abnormal findings: Secondary | ICD-10-CM | POA: Diagnosis not present

## 2021-04-21 DIAGNOSIS — Z79899 Other long term (current) drug therapy: Secondary | ICD-10-CM | POA: Insufficient documentation

## 2021-04-21 DIAGNOSIS — I10 Essential (primary) hypertension: Secondary | ICD-10-CM | POA: Diagnosis not present

## 2021-04-21 DIAGNOSIS — E1140 Type 2 diabetes mellitus with diabetic neuropathy, unspecified: Secondary | ICD-10-CM | POA: Diagnosis not present

## 2021-04-21 DIAGNOSIS — N189 Chronic kidney disease, unspecified: Secondary | ICD-10-CM | POA: Insufficient documentation

## 2021-04-21 DIAGNOSIS — I129 Hypertensive chronic kidney disease with stage 1 through stage 4 chronic kidney disease, or unspecified chronic kidney disease: Secondary | ICD-10-CM | POA: Insufficient documentation

## 2021-04-21 DIAGNOSIS — E875 Hyperkalemia: Secondary | ICD-10-CM | POA: Diagnosis not present

## 2021-04-21 DIAGNOSIS — E1122 Type 2 diabetes mellitus with diabetic chronic kidney disease: Secondary | ICD-10-CM | POA: Diagnosis not present

## 2021-04-21 DIAGNOSIS — N1831 Chronic kidney disease, stage 3a: Secondary | ICD-10-CM | POA: Diagnosis not present

## 2021-04-21 DIAGNOSIS — M21372 Foot drop, left foot: Secondary | ICD-10-CM | POA: Diagnosis not present

## 2021-04-21 DIAGNOSIS — E785 Hyperlipidemia, unspecified: Secondary | ICD-10-CM | POA: Diagnosis not present

## 2021-04-21 LAB — BASIC METABOLIC PANEL
Anion gap: 7 (ref 5–15)
BUN: 49 mg/dL — ABNORMAL HIGH (ref 8–23)
CO2: 20 mmol/L — ABNORMAL LOW (ref 22–32)
Calcium: 9.6 mg/dL (ref 8.9–10.3)
Chloride: 109 mmol/L (ref 98–111)
Creatinine, Ser: 2.84 mg/dL — ABNORMAL HIGH (ref 0.61–1.24)
GFR, Estimated: 24 mL/min — ABNORMAL LOW (ref >60)
Glucose, Bld: 116 mg/dL — ABNORMAL HIGH (ref 70–99)
Potassium: 5.9 mmol/L — ABNORMAL HIGH (ref 3.5–5.1)
Sodium: 136 mmol/L (ref 135–145)

## 2021-04-21 LAB — CBC WITH DIFFERENTIAL/PLATELET
Abs Immature Granulocytes: 0.11 10*3/uL — ABNORMAL HIGH (ref 0.00–0.07)
Basophils Absolute: 0.1 10*3/uL (ref 0.0–0.1)
Basophils Relative: 1 %
Eosinophils Absolute: 0.5 10*3/uL (ref 0.0–0.5)
Eosinophils Relative: 6 %
HCT: 33.6 % — ABNORMAL LOW (ref 39.0–52.0)
Hemoglobin: 10.8 g/dL — ABNORMAL LOW (ref 13.0–17.0)
Immature Granulocytes: 1 %
Lymphocytes Relative: 21 %
Lymphs Abs: 1.8 10*3/uL (ref 0.7–4.0)
MCH: 29.2 pg (ref 26.0–34.0)
MCHC: 32.1 g/dL (ref 30.0–36.0)
MCV: 90.8 fL (ref 80.0–100.0)
Monocytes Absolute: 0.6 10*3/uL (ref 0.1–1.0)
Monocytes Relative: 7 %
Neutro Abs: 5.6 10*3/uL (ref 1.7–7.7)
Neutrophils Relative %: 64 %
Platelets: 203 10*3/uL (ref 150–400)
RBC: 3.7 MIL/uL — ABNORMAL LOW (ref 4.22–5.81)
RDW: 15.1 % (ref 11.5–15.5)
WBC: 8.8 10*3/uL (ref 4.0–10.5)
nRBC: 0 % (ref 0.0–0.2)

## 2021-04-21 LAB — MAGNESIUM: Magnesium: 1.9 mg/dL (ref 1.7–2.4)

## 2021-04-21 NOTE — ED Triage Notes (Addendum)
Pt at renal MD yesterday and potassium is 6.1 and lokelma prescribed. He took 1 today. Then he went to his PCP today and they called and potassium is 6.8. He called renal MD and they recommended he come to ED. He therefore came in to see [place] today. He states his muscles in his legs have no energy.  ?

## 2021-04-22 ENCOUNTER — Emergency Department (HOSPITAL_BASED_OUTPATIENT_CLINIC_OR_DEPARTMENT_OTHER)
Admission: EM | Admit: 2021-04-22 | Discharge: 2021-04-22 | Disposition: A | Discharge status: Home / Self Care | Payer: Medicare HMO | Attending: Emergency Medicine | Admitting: Emergency Medicine

## 2021-04-22 DIAGNOSIS — E875 Hyperkalemia: Secondary | ICD-10-CM

## 2021-04-22 MED ORDER — SODIUM CHLORIDE 0.9 % IV BOLUS
500.0000 mL | Freq: Once | INTRAVENOUS | Status: AC
  Administered 2021-04-22: 500 mL via INTRAVENOUS

## 2021-04-22 NOTE — ED Provider Notes (Signed)
?[facility]PARTMENT ?Provider Note ? ?CSN: [id number] ?Arrival date & time: 04/21/21 2157 ? ?Chief Complaint(s) ?No chief complaint on file. ? ?HPI ?Peter Bryan is a 61 y.o. male with a past medical history listed below including hypertension, diabetes, CKD who presents to the emergency department for hypokalemia.  He was at his nephrologist's office today getting blood work done.  He got a call back this afternoon from labs today stating that his potassium was 6.8.  He was told to take [facility] which she did around 8 PM.  He was also instructed to present to the emergency department for evaluation.  He denied any chest pain, dysrhythmias. ? ?HPI ? ?Past Medical History ?Past Medical History:  ?Diagnosis Date  ? Chronic kidney disease   ? Diabetes mellitus without complication ([place])   ? Hypertension   ? Neuromuscular disorder ([place])   ? neuropathy feet  ? Obesity   ? Sleep apnea   ? ?Patient Active Problem List  ? Diagnosis Date Noted  ? OBSTRUCTIVE SLEEP APNEA 10/16/2009  ? OBESITY 03/13/2007  ? SLEEP APNEA 03/10/2007  ? ?Home Medication(s) ?Prior to Admission medications   ?Medication Sig Start Date End Date Taking? Authorizing Provider  ?allopurinol (ZYLOPRIM) 300 MG tablet Take 300 mg by mouth every evening. 1800    [provider]  ?amLODipine (NORVASC) 5 MG tablet Take 5 mg by mouth daily. 02/15/19   [provider]  ?baclofen (LIORESAL) 10 MG tablet Take 10 mg by mouth daily as needed for muscle spasms.    [provider]  ?Cholecalciferol (QC VITAMIN D3) 50 MCG (2000 UT) TABS Take 2,000 Units by mouth every evening.    [provider]  ?gabapentin (NEURONTIN) 300 MG capsule Take 300 mg by mouth 4 (four) times daily. 07/22/08   [provider]  ?HYDROcodone-acetaminophen (NORCO) 10-325 MG per tablet Take 2-2.5 tablets by mouth See admin instructions. Take 2.5 mg in the morning and at lunch as 2 tables in the evening    [provider]   ?rosuvastatin (CRESTOR) 5 MG tablet Take 5 mg by mouth daily.      [provider]  ?SAVELLA 50 MG TABS tablet Take 50 mg by mouth 2 (two) times daily. Take at 12 noon and Midnight 02/06/19   [provider]  ?sitaGLIPtin (JANUVIA) 25 MG tablet Take 25 mg by mouth daily.      [provider]  ?tadalafil (CIALIS) 10 MG tablet Take 10 mg by mouth daily.  12/05/18   [provider]  ?testosterone cypionate (DEPOTESTOSTERONE CYPIONATE) 200 MG/ML injection Inject 200 mg into the muscle once a week. 02/24/19   [provider]  ?valsartan-hydrochlorothiazide (DIOVAN-HCT) 320-25 MG tablet Take 1 tablet by mouth at bedtime. Midnight    [provider]  ?zolpidem (AMBIEN) 10 MG tablet Take 10 mg by mouth at bedtime.  01/31/19   [provider]  ?                                                                                                                                  ?  Allergies ?Patient has no known allergies. ? ?Review of Systems ?Review of Systems ?As noted in HPI ? ?Physical Exam ?Vital Signs  ?I have reviewed the triage vital signs ?BP (!) 154/83   Pulse 68   Temp 99 ?F (37.2 ?C) (Oral)   Resp 18   SpO2 99%  ? ?Physical Exam ?Vitals reviewed.  ?Constitutional:   ?   General: He is not in acute distress. ?   Appearance: He is well-developed. He is not diaphoretic.  ?HENT:  ?   Head: Normocephalic and atraumatic.  ?   Right Ear: External ear normal.  ?   Left Ear: External ear normal.  ?   Nose: Nose normal.  ?   Mouth/Throat:  ?   Mouth: Mucous membranes are moist.  ?Eyes:  ?   General: No scleral icterus. ?   Conjunctiva/sclera: Conjunctivae normal.  ?Neck:  ?   Trachea: Phonation normal.  ?Cardiovascular:  ?   Rate and Rhythm: Normal rate and regular rhythm.  ?Pulmonary:  ?   Effort: Pulmonary effort is normal. No respiratory distress.  ?   Breath sounds: No stridor.  ?Abdominal:  ?   General: There is no distension.  ?Musculoskeletal:     ?   General:  Normal range of motion.  ?   Cervical back: Normal range of motion.  ?Neurological:  ?   Mental Status: He is alert and oriented to person, place, and time.  ?Psychiatric:     ?   Behavior: Behavior normal.  ? ? ?ED Results and Treatments ?Labs ?(all labs ordered are listed, but only abnormal results are displayed) ?Labs Reviewed  ?BASIC METABOLIC PANEL - Abnormal; Notable for the following components:  ?    Result Value  ? Potassium 5.9 (*)   ? CO2 20 (*)   ? Glucose, Bld 116 (*)   ? BUN 49 (*)   ? Creatinine, Ser 2.84 (*)   ? GFR, Estimated 24 (*)   ? All other components within normal limits  ?CBC WITH DIFFERENTIAL/PLATELET - Abnormal; Notable for the following components:  ? RBC 3.70 (*)   ? Hemoglobin 10.8 (*)   ? HCT 33.6 (*)   ? Abs Immature Granulocytes 0.11 (*)   ? All other components within normal limits  ?MAGNESIUM  ?                                                                                                                       ?EKG ? EKG Interpretation ? ?Date/Time:  Tuesday April 21 2021 22:21:02 EDT ?Ventricular Rate:  84 ?PR Interval:  158 ?QRS Duration: 98 ?QT Interval:  330 ?QTC Calculation: 389 ?R Axis:   -6 ?Text Interpretation: Normal sinus rhythm Septal infarct , age undetermined Abnormal ECG When compared with ECG of 17-Dec-2020 14:33, PREVIOUS ECG IS PRESENT No acute changes Confirmed by Addison Lank [phone]) on 04/22/2021 1:37:12 AM ?  ? ?  ? ?Radiology ?No results found. ? ?Pertinent labs & imaging results  that were available during my care of the patient were reviewed by me and considered in my medical decision making (see MDM for details). ? ?Medications Ordered in ED ?Medications  ?sodium chloride 0.9 % bolus 500 mL (500 mLs Intravenous New Bag/Given 04/22/21 0230)  ?                                                               ?                                                                    ?Procedures ?Procedures ? ?(including critical care time) ? ?Medical Decision Making /  ED Course ? ? ? Complexity of Problem: ? ?Co-morbidities/SDOH that complicate the patient evaluation/care: ?Noted above in HPI ? ?Additional history obtained: ?Nephrologist note/telephone.  Confirmed potassium level at 6.8 from labs earlier today.  No mention of whether there was or was not hemolysis.  Additionally several days ago patient had metabolic panel with a potassium of 6.1. ? ?Patient's presenting problem/concern, DDX, and MDM listed below: ?Hyperkalemia ?We will repeat basic labs to assess level. ? ?Hospitalization Considered:  ?yes ? ?Initial Intervention:  ?IVF ? ?  Complexity of Data: ?  ?Cardiac Monitoring: ?The patient was maintained on a cardiac monitor.   ?I personally viewed and interpreted the cardiac monitored which showed an underlying rhythm of normal sinus rhythm with rates in the 80s.  No dysrhythmias or blocks. ? ?Laboratory Tests ordered listed below with my independent interpretation: ?CBC without leukocytosis ?Metabolic panel with mild hyperkalemia at 5.9.  Stable renal function. ?  ?Imaging Studies ordered listed below with my independent interpretation: ?Not needed ?  ?  ?ED Course:   ? ?Assessment, Add'l Intervention, and Reassessment: ?Hyperkalemia ?No EKG changes. ?Level of improvement (almost 1 mmol/L) is too rapid for Lokelma at the 2-hour mark.  Question whether labs earlier today were hemolyzed. ?Given additional IV fluids.  ?Patient was monitored for several hours without any dysrhythmias. ?Feel he is stable for continued outpatient management by his nephrologist.  Recommended he touch base with them today. ? ? ?Final Clinical Impression(s) / ED Diagnoses ?Final diagnoses:  ?Hyperkalemia  ? ?The patient appears reasonably screened and/or stabilized for discharge and I doubt any other medical condition or other [facility] requiring further screening, evaluation, or treatment in the ED at this time prior to discharge. Safe for discharge with strict return precautions. ? ?Disposition:  Discharge ? ?Condition: Good ? ?I have discussed the results, Dx and Tx plan with the patient/family who expressed understanding and agree(s) with the plan. Discharge instructions discussed at length. The pa

## 2021-04-27 DIAGNOSIS — M549 Dorsalgia, unspecified: Secondary | ICD-10-CM | POA: Diagnosis not present

## 2021-04-27 DIAGNOSIS — M4325 Fusion of spine, thoracolumbar region: Secondary | ICD-10-CM | POA: Diagnosis not present

## 2021-04-30 DIAGNOSIS — M79671 Pain in right foot: Secondary | ICD-10-CM | POA: Diagnosis not present

## 2021-04-30 DIAGNOSIS — M7671 Peroneal tendinitis, right leg: Secondary | ICD-10-CM | POA: Diagnosis not present

## 2021-05-07 DIAGNOSIS — G4733 Obstructive sleep apnea (adult) (pediatric): Secondary | ICD-10-CM | POA: Diagnosis not present

## 2021-05-07 DIAGNOSIS — E875 Hyperkalemia: Secondary | ICD-10-CM | POA: Diagnosis not present

## 2021-05-07 DIAGNOSIS — M1000 Idiopathic gout, unspecified site: Secondary | ICD-10-CM | POA: Diagnosis not present

## 2021-05-07 DIAGNOSIS — E1169 Type 2 diabetes mellitus with other specified complication: Secondary | ICD-10-CM | POA: Diagnosis not present

## 2021-05-07 DIAGNOSIS — N183 Chronic kidney disease, stage 3 unspecified: Secondary | ICD-10-CM | POA: Diagnosis not present

## 2021-05-07 DIAGNOSIS — I10 Essential (primary) hypertension: Secondary | ICD-10-CM | POA: Diagnosis not present

## 2021-05-07 DIAGNOSIS — E559 Vitamin D deficiency, unspecified: Secondary | ICD-10-CM | POA: Diagnosis not present

## 2021-05-07 DIAGNOSIS — E669 Obesity, unspecified: Secondary | ICD-10-CM | POA: Diagnosis not present

## 2021-05-07 DIAGNOSIS — N189 Chronic kidney disease, unspecified: Secondary | ICD-10-CM | POA: Diagnosis not present

## 2021-05-07 DIAGNOSIS — E785 Hyperlipidemia, unspecified: Secondary | ICD-10-CM | POA: Diagnosis not present

## 2021-05-14 DIAGNOSIS — H2511 Age-related nuclear cataract, right eye: Secondary | ICD-10-CM | POA: Diagnosis not present

## 2021-05-14 DIAGNOSIS — H524 Presbyopia: Secondary | ICD-10-CM | POA: Diagnosis not present

## 2021-05-14 DIAGNOSIS — H2512 Age-related nuclear cataract, left eye: Secondary | ICD-10-CM | POA: Diagnosis not present

## 2021-05-14 DIAGNOSIS — H5203 Hypermetropia, bilateral: Secondary | ICD-10-CM | POA: Diagnosis not present

## 2021-05-14 DIAGNOSIS — H52209 Unspecified astigmatism, unspecified eye: Secondary | ICD-10-CM | POA: Diagnosis not present

## 2021-05-14 DIAGNOSIS — E119 Type 2 diabetes mellitus without complications: Secondary | ICD-10-CM | POA: Diagnosis not present

## 2021-05-16 DIAGNOSIS — H524 Presbyopia: Secondary | ICD-10-CM | POA: Diagnosis not present

## 2021-05-16 DIAGNOSIS — H5203 Hypermetropia, bilateral: Secondary | ICD-10-CM | POA: Diagnosis not present

## 2021-05-16 DIAGNOSIS — H52209 Unspecified astigmatism, unspecified eye: Secondary | ICD-10-CM | POA: Diagnosis not present

## 2021-06-04 DIAGNOSIS — M4805 Spinal stenosis, thoracolumbar region: Secondary | ICD-10-CM | POA: Diagnosis not present

## 2021-06-04 DIAGNOSIS — E1142 Type 2 diabetes mellitus with diabetic polyneuropathy: Secondary | ICD-10-CM | POA: Diagnosis not present

## 2021-06-04 DIAGNOSIS — M21372 Foot drop, left foot: Secondary | ICD-10-CM | POA: Diagnosis not present

## 2021-06-09 DIAGNOSIS — M4322 Fusion of spine, cervical region: Secondary | ICD-10-CM | POA: Diagnosis not present

## 2021-06-09 DIAGNOSIS — I10 Essential (primary) hypertension: Secondary | ICD-10-CM | POA: Diagnosis not present

## 2021-06-09 DIAGNOSIS — M4802 Spinal stenosis, cervical region: Secondary | ICD-10-CM | POA: Diagnosis not present

## 2021-06-09 DIAGNOSIS — M4325 Fusion of spine, thoracolumbar region: Secondary | ICD-10-CM | POA: Diagnosis not present

## 2021-06-11 ENCOUNTER — Other Ambulatory Visit: Payer: Self-pay | Admitting: Orthopaedic Surgery

## 2021-06-11 DIAGNOSIS — M4322 Fusion of spine, cervical region: Secondary | ICD-10-CM

## 2021-06-11 DIAGNOSIS — M4802 Spinal stenosis, cervical region: Secondary | ICD-10-CM

## 2021-06-27 ENCOUNTER — Ambulatory Visit
Admission: RE | Admit: 2021-06-27 | Discharge: 2021-06-27 | Disposition: A | Discharge status: Home / Self Care | Payer: Medicare HMO | Source: Ambulatory Visit | Attending: Orthopaedic Surgery | Admitting: Orthopaedic Surgery

## 2021-06-27 DIAGNOSIS — M4802 Spinal stenosis, cervical region: Secondary | ICD-10-CM

## 2021-06-27 DIAGNOSIS — G9589 Other specified diseases of spinal cord: Secondary | ICD-10-CM | POA: Diagnosis not present

## 2021-06-27 DIAGNOSIS — G9520 Unspecified cord compression: Secondary | ICD-10-CM | POA: Diagnosis not present

## 2021-06-27 DIAGNOSIS — M4322 Fusion of spine, cervical region: Secondary | ICD-10-CM

## 2021-06-27 DIAGNOSIS — M5021 Other cervical disc displacement,  high cervical region: Secondary | ICD-10-CM | POA: Diagnosis not present

## 2021-06-27 MED ORDER — GADOBENATE DIMEGLUMINE 529 MG/ML IV SOLN
20.0000 mL | Freq: Once | INTRAVENOUS | Status: AC | PRN
  Administered 2021-06-27: 20 mL via INTRAVENOUS

## 2021-06-30 DIAGNOSIS — H6121 Impacted cerumen, right ear: Secondary | ICD-10-CM | POA: Diagnosis not present

## 2021-07-09 DIAGNOSIS — M5001 Cervical disc disorder with myelopathy,  high cervical region: Secondary | ICD-10-CM | POA: Diagnosis not present

## 2021-07-09 DIAGNOSIS — M4322 Fusion of spine, cervical region: Secondary | ICD-10-CM | POA: Diagnosis not present

## 2021-07-09 DIAGNOSIS — M4802 Spinal stenosis, cervical region: Secondary | ICD-10-CM | POA: Diagnosis not present

## 2021-07-09 DIAGNOSIS — M4325 Fusion of spine, thoracolumbar region: Secondary | ICD-10-CM | POA: Diagnosis not present

## 2021-07-29 DIAGNOSIS — H903 Sensorineural hearing loss, bilateral: Secondary | ICD-10-CM | POA: Diagnosis not present

## 2021-07-29 DIAGNOSIS — H9313 Tinnitus, bilateral: Secondary | ICD-10-CM | POA: Diagnosis not present

## 2021-07-29 DIAGNOSIS — H6121 Impacted cerumen, right ear: Secondary | ICD-10-CM | POA: Diagnosis not present

## 2021-08-04 DIAGNOSIS — G619 Inflammatory polyneuropathy, unspecified: Secondary | ICD-10-CM | POA: Diagnosis not present

## 2021-08-04 DIAGNOSIS — I10 Essential (primary) hypertension: Secondary | ICD-10-CM | POA: Diagnosis not present

## 2021-08-04 DIAGNOSIS — E118 Type 2 diabetes mellitus with unspecified complications: Secondary | ICD-10-CM | POA: Diagnosis not present

## 2021-08-04 DIAGNOSIS — G8929 Other chronic pain: Secondary | ICD-10-CM | POA: Diagnosis not present

## 2021-08-04 DIAGNOSIS — N184 Chronic kidney disease, stage 4 (severe): Secondary | ICD-10-CM | POA: Diagnosis not present

## 2021-08-04 DIAGNOSIS — G9589 Other specified diseases of spinal cord: Secondary | ICD-10-CM | POA: Diagnosis not present

## 2021-08-06 DIAGNOSIS — N1832 Chronic kidney disease, stage 3b: Secondary | ICD-10-CM | POA: Diagnosis not present

## 2021-08-06 DIAGNOSIS — G4733 Obstructive sleep apnea (adult) (pediatric): Secondary | ICD-10-CM | POA: Diagnosis not present

## 2021-08-06 DIAGNOSIS — I10 Essential (primary) hypertension: Secondary | ICD-10-CM | POA: Diagnosis not present

## 2021-08-06 DIAGNOSIS — E8889 Other specified metabolic disorders: Secondary | ICD-10-CM | POA: Diagnosis not present

## 2021-08-06 DIAGNOSIS — R5383 Other fatigue: Secondary | ICD-10-CM | POA: Diagnosis not present

## 2021-08-06 DIAGNOSIS — E785 Hyperlipidemia, unspecified: Secondary | ICD-10-CM | POA: Diagnosis not present

## 2021-08-06 DIAGNOSIS — Z1331 Encounter for screening for depression: Secondary | ICD-10-CM | POA: Diagnosis not present

## 2021-08-06 DIAGNOSIS — E119 Type 2 diabetes mellitus without complications: Secondary | ICD-10-CM | POA: Diagnosis not present

## 2021-08-12 DIAGNOSIS — M4802 Spinal stenosis, cervical region: Secondary | ICD-10-CM | POA: Diagnosis not present

## 2021-08-12 DIAGNOSIS — I10 Essential (primary) hypertension: Secondary | ICD-10-CM | POA: Diagnosis not present

## 2021-08-12 DIAGNOSIS — M4325 Fusion of spine, thoracolumbar region: Secondary | ICD-10-CM | POA: Diagnosis not present

## 2021-08-12 DIAGNOSIS — M5001 Cervical disc disorder with myelopathy,  high cervical region: Secondary | ICD-10-CM | POA: Diagnosis not present

## 2021-08-22 DIAGNOSIS — W19XXXA Unspecified fall, initial encounter: Secondary | ICD-10-CM | POA: Diagnosis not present

## 2021-08-22 DIAGNOSIS — E1122 Type 2 diabetes mellitus with diabetic chronic kidney disease: Secondary | ICD-10-CM | POA: Diagnosis not present

## 2021-08-22 DIAGNOSIS — M4802 Spinal stenosis, cervical region: Secondary | ICD-10-CM | POA: Diagnosis not present

## 2021-08-22 DIAGNOSIS — S0990XA Unspecified injury of head, initial encounter: Secondary | ICD-10-CM | POA: Diagnosis not present

## 2021-08-22 DIAGNOSIS — Z043 Encounter for examination and observation following other accident: Secondary | ICD-10-CM | POA: Diagnosis not present

## 2021-08-22 DIAGNOSIS — Z981 Arthrodesis status: Secondary | ICD-10-CM | POA: Diagnosis not present

## 2021-08-22 DIAGNOSIS — Y999 Unspecified external cause status: Secondary | ICD-10-CM | POA: Diagnosis not present

## 2021-08-22 DIAGNOSIS — Y92009 Unspecified place in unspecified non-institutional (private) residence as the place of occurrence of the external cause: Secondary | ICD-10-CM | POA: Diagnosis not present

## 2021-08-22 DIAGNOSIS — N189 Chronic kidney disease, unspecified: Secondary | ICD-10-CM | POA: Diagnosis not present

## 2021-08-22 DIAGNOSIS — W010XXA Fall on same level from slipping, tripping and stumbling without subsequent striking against object, initial encounter: Secondary | ICD-10-CM | POA: Diagnosis not present

## 2021-08-22 DIAGNOSIS — S199XXA Unspecified injury of neck, initial encounter: Secondary | ICD-10-CM | POA: Diagnosis not present

## 2021-08-22 DIAGNOSIS — Y9301 Activity, walking, marching and hiking: Secondary | ICD-10-CM | POA: Diagnosis not present

## 2021-08-22 DIAGNOSIS — W1839XA Other fall on same level, initial encounter: Secondary | ICD-10-CM | POA: Diagnosis not present

## 2021-08-22 DIAGNOSIS — G9589 Other specified diseases of spinal cord: Secondary | ICD-10-CM | POA: Diagnosis not present

## 2021-08-22 DIAGNOSIS — I129 Hypertensive chronic kidney disease with stage 1 through stage 4 chronic kidney disease, or unspecified chronic kidney disease: Secondary | ICD-10-CM | POA: Diagnosis not present

## 2021-08-22 DIAGNOSIS — Z743 Need for continuous supervision: Secondary | ICD-10-CM | POA: Diagnosis not present

## 2021-08-22 DIAGNOSIS — R208 Other disturbances of skin sensation: Secondary | ICD-10-CM | POA: Diagnosis not present

## 2021-08-22 DIAGNOSIS — G8929 Other chronic pain: Secondary | ICD-10-CM | POA: Diagnosis not present

## 2021-08-22 DIAGNOSIS — G4489 Other headache syndrome: Secondary | ICD-10-CM | POA: Diagnosis not present

## 2021-08-24 DIAGNOSIS — I10 Essential (primary) hypertension: Secondary | ICD-10-CM | POA: Diagnosis not present

## 2021-08-24 DIAGNOSIS — Z6841 Body Mass Index (BMI) 40.0 and over, adult: Secondary | ICD-10-CM | POA: Diagnosis not present

## 2021-08-24 DIAGNOSIS — M4802 Spinal stenosis, cervical region: Secondary | ICD-10-CM | POA: Diagnosis not present

## 2021-08-24 DIAGNOSIS — M4325 Fusion of spine, thoracolumbar region: Secondary | ICD-10-CM | POA: Diagnosis not present

## 2021-08-27 DIAGNOSIS — M47812 Spondylosis without myelopathy or radiculopathy, cervical region: Secondary | ICD-10-CM | POA: Diagnosis not present

## 2021-08-27 DIAGNOSIS — Z6841 Body Mass Index (BMI) 40.0 and over, adult: Secondary | ICD-10-CM | POA: Diagnosis not present

## 2021-08-31 DIAGNOSIS — N1832 Chronic kidney disease, stage 3b: Secondary | ICD-10-CM | POA: Diagnosis not present

## 2021-08-31 DIAGNOSIS — E119 Type 2 diabetes mellitus without complications: Secondary | ICD-10-CM | POA: Diagnosis not present

## 2021-09-03 DIAGNOSIS — G8929 Other chronic pain: Secondary | ICD-10-CM | POA: Diagnosis not present

## 2021-09-03 DIAGNOSIS — R600 Localized edema: Secondary | ICD-10-CM | POA: Diagnosis not present

## 2021-09-03 DIAGNOSIS — I152 Hypertension secondary to endocrine disorders: Secondary | ICD-10-CM | POA: Diagnosis not present

## 2021-09-03 DIAGNOSIS — N184 Chronic kidney disease, stage 4 (severe): Secondary | ICD-10-CM | POA: Diagnosis not present

## 2021-09-03 DIAGNOSIS — I151 Hypertension secondary to other renal disorders: Secondary | ICD-10-CM | POA: Diagnosis not present

## 2021-09-03 DIAGNOSIS — M542 Cervicalgia: Secondary | ICD-10-CM | POA: Diagnosis not present

## 2021-09-03 DIAGNOSIS — I10 Essential (primary) hypertension: Secondary | ICD-10-CM | POA: Diagnosis not present

## 2021-09-04 DIAGNOSIS — I10 Essential (primary) hypertension: Secondary | ICD-10-CM | POA: Diagnosis not present

## 2021-09-04 DIAGNOSIS — E875 Hyperkalemia: Secondary | ICD-10-CM | POA: Diagnosis not present

## 2021-09-04 DIAGNOSIS — N184 Chronic kidney disease, stage 4 (severe): Secondary | ICD-10-CM | POA: Diagnosis not present

## 2021-09-04 DIAGNOSIS — G8929 Other chronic pain: Secondary | ICD-10-CM | POA: Diagnosis not present

## 2021-09-15 DIAGNOSIS — N1831 Chronic kidney disease, stage 3a: Secondary | ICD-10-CM | POA: Diagnosis not present

## 2021-09-15 DIAGNOSIS — I10 Essential (primary) hypertension: Secondary | ICD-10-CM | POA: Diagnosis not present

## 2021-09-15 DIAGNOSIS — I739 Peripheral vascular disease, unspecified: Secondary | ICD-10-CM | POA: Diagnosis not present

## 2021-09-15 DIAGNOSIS — R600 Localized edema: Secondary | ICD-10-CM | POA: Diagnosis not present

## 2021-09-15 DIAGNOSIS — R609 Edema, unspecified: Secondary | ICD-10-CM | POA: Diagnosis not present

## 2021-09-15 DIAGNOSIS — Z79899 Other long term (current) drug therapy: Secondary | ICD-10-CM | POA: Diagnosis not present

## 2021-09-15 DIAGNOSIS — I129 Hypertensive chronic kidney disease with stage 1 through stage 4 chronic kidney disease, or unspecified chronic kidney disease: Secondary | ICD-10-CM | POA: Diagnosis not present

## 2021-09-15 DIAGNOSIS — Z743 Need for continuous supervision: Secondary | ICD-10-CM | POA: Diagnosis not present

## 2021-09-15 DIAGNOSIS — E1122 Type 2 diabetes mellitus with diabetic chronic kidney disease: Secondary | ICD-10-CM | POA: Diagnosis not present

## 2021-09-23 DIAGNOSIS — E860 Dehydration: Secondary | ICD-10-CM | POA: Diagnosis not present

## 2021-09-23 DIAGNOSIS — N184 Chronic kidney disease, stage 4 (severe): Secondary | ICD-10-CM | POA: Diagnosis not present

## 2021-09-23 DIAGNOSIS — M4802 Spinal stenosis, cervical region: Secondary | ICD-10-CM | POA: Diagnosis not present

## 2021-09-23 DIAGNOSIS — I10 Essential (primary) hypertension: Secondary | ICD-10-CM | POA: Diagnosis not present

## 2021-09-23 DIAGNOSIS — Z6841 Body Mass Index (BMI) 40.0 and over, adult: Secondary | ICD-10-CM | POA: Diagnosis not present

## 2021-09-23 DIAGNOSIS — M4325 Fusion of spine, thoracolumbar region: Secondary | ICD-10-CM | POA: Diagnosis not present

## 2021-09-23 DIAGNOSIS — G8929 Other chronic pain: Secondary | ICD-10-CM | POA: Diagnosis not present

## 2021-09-28 DIAGNOSIS — G8929 Other chronic pain: Secondary | ICD-10-CM | POA: Diagnosis not present

## 2021-09-28 DIAGNOSIS — K5900 Constipation, unspecified: Secondary | ICD-10-CM | POA: Diagnosis not present

## 2021-09-30 DIAGNOSIS — K5900 Constipation, unspecified: Secondary | ICD-10-CM | POA: Diagnosis not present

## 2021-09-30 DIAGNOSIS — E875 Hyperkalemia: Secondary | ICD-10-CM | POA: Diagnosis not present

## 2021-10-03 ENCOUNTER — Encounter (HOSPITAL_BASED_OUTPATIENT_CLINIC_OR_DEPARTMENT_OTHER): Payer: Self-pay | Admitting: Emergency Medicine

## 2021-10-03 ENCOUNTER — Emergency Department (HOSPITAL_BASED_OUTPATIENT_CLINIC_OR_DEPARTMENT_OTHER): Payer: Medicare HMO

## 2021-10-03 ENCOUNTER — Other Ambulatory Visit: Payer: Self-pay

## 2021-10-03 ENCOUNTER — Emergency Department (HOSPITAL_BASED_OUTPATIENT_CLINIC_OR_DEPARTMENT_OTHER)
Admission: EM | Admit: 2021-10-03 | Discharge: 2021-10-03 | Disposition: A | Discharge status: Home / Self Care | Payer: Medicare HMO | Attending: Emergency Medicine | Admitting: Emergency Medicine

## 2021-10-03 ENCOUNTER — Encounter (HOSPITAL_COMMUNITY): Payer: Self-pay

## 2021-10-03 DIAGNOSIS — N179 Acute kidney failure, unspecified: Secondary | ICD-10-CM | POA: Diagnosis present

## 2021-10-03 DIAGNOSIS — N189 Chronic kidney disease, unspecified: Secondary | ICD-10-CM | POA: Diagnosis not present

## 2021-10-03 DIAGNOSIS — R1010 Upper abdominal pain, unspecified: Secondary | ICD-10-CM

## 2021-10-03 DIAGNOSIS — N183 Chronic kidney disease, stage 3 unspecified: Secondary | ICD-10-CM | POA: Insufficient documentation

## 2021-10-03 DIAGNOSIS — Z79899 Other long term (current) drug therapy: Secondary | ICD-10-CM | POA: Diagnosis not present

## 2021-10-03 DIAGNOSIS — I129 Hypertensive chronic kidney disease with stage 1 through stage 4 chronic kidney disease, or unspecified chronic kidney disease: Secondary | ICD-10-CM | POA: Diagnosis not present

## 2021-10-03 DIAGNOSIS — K862 Cyst of pancreas: Secondary | ICD-10-CM | POA: Diagnosis not present

## 2021-10-03 DIAGNOSIS — K5900 Constipation, unspecified: Secondary | ICD-10-CM | POA: Insufficient documentation

## 2021-10-03 DIAGNOSIS — E1122 Type 2 diabetes mellitus with diabetic chronic kidney disease: Secondary | ICD-10-CM | POA: Diagnosis not present

## 2021-10-03 LAB — CBC WITH DIFFERENTIAL/PLATELET
Abs Immature Granulocytes: 0.06 10*3/uL (ref 0.00–0.07)
Basophils Absolute: 0.1 10*3/uL (ref 0.0–0.1)
Basophils Relative: 1 %
Eosinophils Absolute: 0.4 10*3/uL (ref 0.0–0.5)
Eosinophils Relative: 4 %
HCT: 43.4 % (ref 39.0–52.0)
Hemoglobin: 13.9 g/dL (ref 13.0–17.0)
Immature Granulocytes: 1 %
Lymphocytes Relative: 9 %
Lymphs Abs: 1 10*3/uL (ref 0.7–4.0)
MCH: 28.8 pg (ref 26.0–34.0)
MCHC: 32 g/dL (ref 30.0–36.0)
MCV: 89.9 fL (ref 80.0–100.0)
Monocytes Absolute: 0.8 10*3/uL (ref 0.1–1.0)
Monocytes Relative: 8 %
Neutro Abs: 8.6 10*3/uL — ABNORMAL HIGH (ref 1.7–7.7)
Neutrophils Relative %: 77 %
Platelets: 223 10*3/uL (ref 150–400)
RBC: 4.83 MIL/uL (ref 4.22–5.81)
RDW: 13.5 % (ref 11.5–15.5)
WBC: 10.9 10*3/uL — ABNORMAL HIGH (ref 4.0–10.5)
nRBC: 0 % (ref 0.0–0.2)

## 2021-10-03 LAB — URINALYSIS, ROUTINE W REFLEX MICROSCOPIC
Bilirubin Urine: NEGATIVE
Glucose, UA: NEGATIVE mg/dL
Ketones, ur: NEGATIVE mg/dL
Leukocytes,Ua: NEGATIVE
Nitrite: NEGATIVE
Protein, ur: 100 mg/dL — AB
Specific Gravity, Urine: 1.02 (ref 1.005–1.030)
pH: 5.5 (ref 5.0–8.0)

## 2021-10-03 LAB — COMPREHENSIVE METABOLIC PANEL
ALT: 22 U/L (ref 0–44)
AST: 18 U/L (ref 15–41)
Albumin: 4.2 g/dL (ref 3.5–5.0)
Alkaline Phosphatase: 74 U/L (ref 38–126)
Anion gap: 8 (ref 5–15)
BUN: 95 mg/dL — ABNORMAL HIGH (ref 8–23)
CO2: 29 mmol/L (ref 22–32)
Calcium: 9.7 mg/dL (ref 8.9–10.3)
Chloride: 97 mmol/L — ABNORMAL LOW (ref 98–111)
Creatinine, Ser: 3.98 mg/dL — ABNORMAL HIGH (ref 0.61–1.24)
GFR, Estimated: 16 mL/min — ABNORMAL LOW (ref >60)
Glucose, Bld: 94 mg/dL (ref 70–99)
Potassium: 4.7 mmol/L (ref 3.5–5.1)
Sodium: 134 mmol/L — ABNORMAL LOW (ref 135–145)
Total Bilirubin: 0.6 mg/dL (ref 0.3–1.2)
Total Protein: 7.8 g/dL (ref 6.5–8.1)

## 2021-10-03 LAB — URINALYSIS, MICROSCOPIC (REFLEX)

## 2021-10-03 LAB — LIPASE, BLOOD: Lipase: 97 U/L — ABNORMAL HIGH (ref 11–51)

## 2021-10-03 LAB — TROPONIN I (HIGH SENSITIVITY): Troponin I (High Sensitivity): 13 ng/L (ref <18)

## 2021-10-03 MED ORDER — SODIUM CHLORIDE 0.9 % IV BOLUS
1000.0000 mL | Freq: Once | INTRAVENOUS | Status: AC
  Administered 2021-10-03: 1000 mL via INTRAVENOUS

## 2021-10-03 MED ORDER — HYDROCODONE-ACETAMINOPHEN 5-325 MG PO TABS
2.0000 | ORAL_TABLET | Freq: Two times a day (BID) | ORAL | Status: DC
  Filled 2021-10-03: qty 2

## 2021-10-03 MED ORDER — SODIUM CHLORIDE 0.9 % IV BOLUS
1000.0000 mL | Freq: Once | INTRAVENOUS | Status: AC
Start: 2021-10-03 — End: 2021-10-03
  Administered 2021-10-03: 1000 mL via INTRAVENOUS

## 2021-10-03 MED ORDER — FLEET ENEMA 7-19 GM/118ML RE ENEM
1.0000 | ENEMA | Freq: Once | RECTAL | Status: AC
  Administered 2021-10-03: 1 via RECTAL
  Filled 2021-10-03: qty 1

## 2021-10-03 MED ORDER — PANTOPRAZOLE SODIUM 40 MG IV SOLR
40.0000 mg | Freq: Once | INTRAVENOUS | Status: AC
  Administered 2021-10-03: 40 mg via INTRAVENOUS
  Filled 2021-10-03: qty 10

## 2021-10-03 MED ORDER — HYDROCODONE-ACETAMINOPHEN 10-325 MG PO TABS: 2.0000 | ORAL_TABLET | ORAL | Status: DC

## 2021-10-03 MED ORDER — ZOLPIDEM TARTRATE 10 MG PO TABS
10.0000 mg | ORAL_TABLET | Freq: Every day | ORAL | Status: DC
  Filled 2021-10-03: qty 1

## 2021-10-03 MED ORDER — SODIUM CHLORIDE 0.9 % IV SOLN: Freq: Once | INTRAVENOUS | Status: AC

## 2021-10-03 NOTE — ED Provider Notes (Signed)
[facility] DEPARTMENT Provider Note   CSN: [id number] Arrival date & time: 10/03/21  1215     History  Chief Complaint  Patient presents with   Constipation    Peter Bryan is a 61 y.o. male.  Patient with no previous surgical history, history of chronic nerve pain on gabapentin and hydrocodone, history of stage 3 kidney disease, type 2 diabetes, PVD -- presents to the emergency department for constipation.  He states that he has not had a bowel movement in about 8 days.  Typically he has a bowel movement every other day.  He has passed a small amount of greenish liquid.  No rectal pain or fullness.  He has generalized abdominal pressure and cramping.  No vomiting.  No urinary symptoms.  He has tried lactulose, magnesium citrate, glycerin suppositories without improvement.       Home Medications Prior to Admission medications   Medication Sig Start Date End Date Taking? Authorizing Provider  allopurinol (ZYLOPRIM) 300 MG tablet Take 300 mg by mouth every evening. 1800    [provider]  amLODipine (NORVASC) 5 MG tablet Take 5 mg by mouth daily. 02/15/19   [provider]  baclofen (LIORESAL) 10 MG tablet Take 10 mg by mouth daily as needed for muscle spasms.    [provider]  Cholecalciferol (QC VITAMIN D3) 50 MCG (2000 UT) TABS Take 2,000 Units by mouth every evening.    [provider]  gabapentin (NEURONTIN) 300 MG capsule Take 300 mg by mouth 4 (four) times daily. 07/22/08   [provider]  HYDROcodone-acetaminophen (NORCO) 10-325 MG per tablet Take 2-2.5 tablets by mouth See admin instructions. Take 2.5 mg in the morning and at lunch as 2 tables in the evening    [provider]  rosuvastatin (CRESTOR) 5 MG tablet Take 5 mg by mouth daily.      [provider]  SAVELLA 50 MG TABS tablet Take 50 mg by mouth 2 (two) times daily. Take at 12 noon and Midnight 02/06/19   [provider]   sitaGLIPtin (JANUVIA) 25 MG tablet Take 25 mg by mouth daily.      [provider]  tadalafil (CIALIS) 10 MG tablet Take 10 mg by mouth daily.  12/05/18   [provider]  testosterone cypionate (DEPOTESTOSTERONE CYPIONATE) 200 MG/ML injection Inject 200 mg into the muscle once a week. 02/24/19   [provider]  valsartan-hydrochlorothiazide (DIOVAN-HCT) 320-25 MG tablet Take 1 tablet by mouth at bedtime. Midnight    [provider]  zolpidem (AMBIEN) 10 MG tablet Take 10 mg by mouth at bedtime.  01/31/19   [provider]      Allergies    Patient has no known allergies.    Review of Systems   Review of Systems  Physical Exam Updated Vital Signs BP (!) 90/51 (BP Location: Right Arm)   Pulse 66   Temp 98 F (36.7 C)   Resp 17   Ht 5\' 11"  (1.803 m)   Wt (!) 143.8 kg   SpO2 99%   BMI 44.21 kg/m   Physical Exam Vitals and nursing note reviewed.  Constitutional:      General: He is in acute distress (Appears moderately uncomfortable).     Appearance: He is well-developed.  HENT:     Head: Normocephalic and atraumatic.  Eyes:     General:        Right eye: No discharge.  Left eye: No discharge.     Conjunctiva/sclera: Conjunctivae normal.  Cardiovascular:     Rate and Rhythm: Normal rate and regular rhythm.     Heart sounds: Normal heart sounds.  Pulmonary:     Effort: Pulmonary effort is normal.     Breath sounds: Normal breath sounds.  Abdominal:     Palpations: Abdomen is soft.     Tenderness: There is abdominal tenderness. There is no guarding or rebound.     Comments: Mild generalized abdominal tenderness to palpation, no focal pain  Musculoskeletal:     Cervical back: Normal range of motion and neck supple.  Skin:    General: Skin is warm and dry.  Neurological:     Mental Status: He is alert.     ED Results / Procedures / Treatments   Labs (all labs ordered are listed, but only abnormal results are  displayed) Labs Reviewed  CBC WITH DIFFERENTIAL/PLATELET - Abnormal; Notable for the following components:      Result Value   WBC 10.9 (*)    Neutro Abs 8.6 (*)    All other components within normal limits  COMPREHENSIVE METABOLIC PANEL - Abnormal; Notable for the following components:   Sodium 134 (*)    Chloride 97 (*)    BUN 95 (*)    Creatinine, Ser 3.98 (*)    GFR, Estimated 16 (*)    All other components within normal limits  LIPASE, BLOOD - Abnormal; Notable for the following components:   Lipase 97 (*)    All other components within normal limits  URINALYSIS, ROUTINE W REFLEX MICROSCOPIC - Abnormal; Notable for the following components:   Hgb urine dipstick TRACE (*)    Protein, ur 100 (*)    All other components within normal limits  URINALYSIS, MICROSCOPIC (REFLEX) - Abnormal; Notable for the following components:   Bacteria, UA RARE (*)    All other components within normal limits  TROPONIN I (HIGH SENSITIVITY)    EKG EKG Interpretation  Date/Time:  Saturday October 03 2021 17:21:17 EDT Ventricular Rate:  67 PR Interval:  62 QRS Duration: 123 QT Interval:  389 QTC Calculation: 411 R Axis:   17 Text Interpretation: Sinus rhythm Short PR interval Nonspecific intraventricular conduction delay No significant change since last tracing Confirmed by Wandra Arthurs [phone]) on 10/03/2021 5:45:32 PM  Radiology CT ABDOMEN PELVIS WO CONTRAST  Result Date: 10/03/2021 CLINICAL DATA:  No bowel movement for 8 days. EXAM: CT ABDOMEN AND PELVIS WITHOUT CONTRAST TECHNIQUE: Multidetector CT imaging of the abdomen and pelvis was performed following the standard protocol without IV contrast. RADIATION DOSE REDUCTION: This exam was performed according to the departmental dose-optimization program which includes automated exposure control, adjustment of the mA and/or kV according to patient size and/or use of iterative reconstruction technique. COMPARISON:  12/17/2020. FINDINGS: Lower  chest: No acute findings. Hepatobiliary: No focal liver abnormality is seen. No gallstones, gallbladder wall thickening, or biliary dilatation. Pancreas: Small cystic lesion in pancreatic neck/body, 1.1 cm. No other pancreatic mass or lesion. No inflammation or duct dilation. Spleen: Normal in size without focal abnormality. Adrenals/Urinary Tract: No adrenal masses. Kidneys normal in overall size, orientation and position. Exophytic hypoattenuating, 5.2 cm mass from the posterior mid to upper pole the right kidney consistent with a cyst, mildly increased in size from the prior CT. No follow-up recommended. No other renal masses, no stones and no hydronephrosis. Normal ureters. Normal bladder. Stomach/Bowel: Normal stomach. Small bowel and colon are normal in  caliber. No wall thickening. No inflammation. There are air-fluid levels within the colon, nonspecific. No evidence of appendicitis. Vascular/Lymphatic: No significant vascular abnormality. No enlarged lymph nodes. Reproductive: Unremarkable. Other: No abdominal wall hernia or abnormality. No abdominopelvic ascites. Musculoskeletal: Previous long thoracolumbar posterior fusion. Orthopedic hardware appears well seated. No acute fracture. No bone lesion. IMPRESSION: 1. No acute findings within the abdomen or pelvis. 2. No increased colonic stool. Nonspecific colonic air-fluid levels. No evidence of bowel obstruction or inflammation. 3. 1.1 cm cystic lesion in the pancreatic neck/body. Current recommendations for incidental pancreatic cyst, less than 1.5 cm, reimage with CT or MRI yearly for 5 years. Electronically Signed   By: Lajean Manes M.D.   On: 10/03/2021 15:25   DG Abdomen 1 View  Result Date: 10/03/2021 CLINICAL DATA:  Constipation, abdominal pressure. Patient complains of no bowel movement for 8 days. EXAM: ABDOMEN - 1 VIEW COMPARISON:  CT renal stone protocol 12/17/2020 FINDINGS: The bowel gas pattern is normal. No radio-opaque calculi. No findings  of free intraperitoneal air. Bilateral pedicle screw and rod fixation spanning the visualized thoracic and lumbar spine with distal fixation in the sacrum. Discontinuity of the right rod at the level of the L3-L4 interspace and both rods level of the L5-S1 interspace is unchanged compared to 12/17/2020. IMPRESSION: 1. Nonobstructive bowel gas pattern. 2. Stable appearance of spinal instrumentation. Electronically Signed   By: Ileana Roup M.D.   On: 10/03/2021 14:39    Procedures Procedures    Medications Ordered in ED Medications - No data to display  ED Course/ Medical Decision Making/ A&P    Patient seen and examined. History obtained directly from patient.   Labs/EKG: Ordered CBC, CMP, lipase, UA.  Imaging: Ordered KUB.  Medications/Fluids: None ordered  Most recent vital signs reviewed and are as follows: BP (!) 90/51 (BP Location: Right Arm)   Pulse 66   Temp 98 F (36.7 C)   Resp 17   Ht 5\' 11"  (1.803 m)   Wt (!) 143.8 kg   SpO2 99%   BMI 44.21 kg/m   Initial impression: Abdominal pain, constipation.  Patient hypotensive on arrival, blood pressures in the low 100s during my exam.  States that blood pressure normally runs 140 range.  2:50 PM Reassessment performed. Patient appears stable.  Blood pressures improved from previous.  Discussed results to this point.  Patient states that he cannot take any more fluids and is asking what we are going to do about the constipation.  He would like to try an enema.  Discussed CT imaging to further evaluate mildly elevated lipase and to evaluate for other causes of constipation.  Patient's creatinine is elevated above baseline.  We will give IV fluid bolus.  Labs personally reviewed and interpreted including: CBC with differential elevated white blood cell count 10.9 otherwise unremarkable; CMP recent baseline creatinine 2.5 elevated to 3.98 today with BUN of 95; lipase mildly elevated at 97.   Imaging personally visualized and  interpreted including: X-ray of the abdomen, agree nonspecific bowel pattern, no obvious signs of obstruction.  CT ordered.  Reviewed pertinent lab work and imaging with patient at bedside. Questions answered.   Most current vital signs reviewed and are as follows: BP 108/61   Pulse 69   Temp 98 F (36.7 C)   Resp 18   Ht 5\' 11"  (1.803 m)   Wt (!) 143.8 kg   SpO2 98%   BMI 44.21 kg/m   Plan: Reassessment.  6:24 PM Reassessment performed.  Patient appears stable.  In the interim, he has had Fleet enema with return of fluid, but no significant stool output.  It did not really make him feel much better.  Labs personally reviewed and interpreted including: Checked troponin which was normal.  EKG initially with poor baseline and likely PACs, rechecked and demonstrates normal sinus rhythm.  Reviewed pertinent lab work and imaging with patient at bedside. Questions answered.   Most current vital signs reviewed and are as follows: BP (!) 111/52   Pulse 65   Temp 98 F (36.7 C)   Resp 14   Ht 5\' 11"  (1.803 m)   Wt (!) 143.8 kg   SpO2 100%   BMI 44.21 kg/m   Plan: Discussed how to proceed with patient and wife at bedside.  Discussed option of admission for further evaluation and treatment of kidney injury versus discharge with symptomatic care and compulsory PCP follow-up in the next day or 2 for recheck of kidney function.  After discussion, patient would prefer admit to hospital.  I also think that this is reasonable given that patient will unlikely be able to tolerate orals well and hydrate well at home.  Discussed case with Dr. Darl Householder.  I consulted with Dr. Posey Pronto of [facility] hospitalist.  They will admit to [facility] for further evaluation and treatment.                            Medical Decision Making Amount and/or Complexity of Data Reviewed Labs: ordered. Radiology: ordered.   Acute on chronic kidney injury: Patient's creatinine is up to 4 from baseline of 2.5.  This is  likely multifactorial, due to hypovolemia due to poor oral intake, hypertension medications and mildly low blood pressures on arrival.  Also with recent use of lactulose and magnesium citrate leading to some water stool.  Patient has been treated with IV fluids.  No signs of UTI today and does not appear to be oliguric.  Abdominal pain and constipation: Unclear etiology.  Patient not severely constipated on CT imaging.  Minimally elevated lipase and WBC.  CT does not demonstrate infection, pancreatitis, gallbladder disease.  Possibly exacerbated by recent liberal use of laxatives.  Considered ACS, reassuring EKG and troponin.  No sign of basilar pneumonia on imaging.  Does not appear to be an obstructive process.        Final Clinical Impression(s) / ED Diagnoses Final diagnoses:  Acute kidney injury ([place])  Pain of upper abdomen  Constipation, unspecified constipation type  Acute renal failure superimposed on chronic kidney disease, unspecified CKD stage, unspecified acute renal failure type [facility])    Rx / DC Orders ED Discharge Orders     None         Carlisle Cater, PA-C 10/03/21 1829    Drenda Freeze, MD 10/03/21 2230

## 2021-10-03 NOTE — ED Notes (Signed)
Patient sitting on bedside commode after enema Will repeat EKG after patient finishes elimination

## 2021-10-03 NOTE — ED Triage Notes (Signed)
Patient c/o no bowel movement x 8 days. Patient denies abdominal pain, nausea or vomiting.

## 2021-10-03 NOTE — Discharge Instructions (Signed)
Please stay hydrated.  Your kidney function is abnormal and you need to see your doctor next week to recheck your kidney function  Return to ER if you have severe abdominal pain, vomiting, fever

## 2021-10-06 DIAGNOSIS — I129 Hypertensive chronic kidney disease with stage 1 through stage 4 chronic kidney disease, or unspecified chronic kidney disease: Secondary | ICD-10-CM | POA: Diagnosis not present

## 2021-10-06 DIAGNOSIS — E1122 Type 2 diabetes mellitus with diabetic chronic kidney disease: Secondary | ICD-10-CM | POA: Diagnosis not present

## 2021-10-06 DIAGNOSIS — Z6841 Body Mass Index (BMI) 40.0 and over, adult: Secondary | ICD-10-CM | POA: Diagnosis not present

## 2021-10-06 DIAGNOSIS — N184 Chronic kidney disease, stage 4 (severe): Secondary | ICD-10-CM | POA: Diagnosis not present

## 2021-10-06 DIAGNOSIS — R809 Proteinuria, unspecified: Secondary | ICD-10-CM | POA: Diagnosis not present

## 2021-10-08 DIAGNOSIS — K5903 Drug induced constipation: Secondary | ICD-10-CM | POA: Diagnosis not present

## 2021-10-08 DIAGNOSIS — R140 Abdominal distension (gaseous): Secondary | ICD-10-CM | POA: Diagnosis not present

## 2021-10-19 ENCOUNTER — Ambulatory Visit (HOSPITAL_BASED_OUTPATIENT_CLINIC_OR_DEPARTMENT_OTHER): Payer: Medicare HMO | Admitting: Physical Therapy

## 2021-10-22 ENCOUNTER — Encounter (HOSPITAL_BASED_OUTPATIENT_CLINIC_OR_DEPARTMENT_OTHER): Payer: Self-pay | Admitting: Physical Therapy

## 2021-10-22 ENCOUNTER — Other Ambulatory Visit: Payer: Self-pay

## 2021-10-22 ENCOUNTER — Ambulatory Visit (HOSPITAL_BASED_OUTPATIENT_CLINIC_OR_DEPARTMENT_OTHER): Payer: Medicare HMO | Attending: Physician Assistant | Admitting: Physical Therapy

## 2021-10-22 DIAGNOSIS — M25611 Stiffness of right shoulder, not elsewhere classified: Secondary | ICD-10-CM | POA: Insufficient documentation

## 2021-10-22 DIAGNOSIS — M25641 Stiffness of right hand, not elsewhere classified: Secondary | ICD-10-CM | POA: Diagnosis not present

## 2021-10-22 DIAGNOSIS — M6281 Muscle weakness (generalized): Secondary | ICD-10-CM | POA: Insufficient documentation

## 2021-10-22 DIAGNOSIS — M542 Cervicalgia: Secondary | ICD-10-CM | POA: Diagnosis not present

## 2021-10-22 DIAGNOSIS — M4802 Spinal stenosis, cervical region: Secondary | ICD-10-CM | POA: Diagnosis not present

## 2021-10-22 DIAGNOSIS — M25612 Stiffness of left shoulder, not elsewhere classified: Secondary | ICD-10-CM | POA: Insufficient documentation

## 2021-10-22 DIAGNOSIS — M25642 Stiffness of left hand, not elsewhere classified: Secondary | ICD-10-CM | POA: Diagnosis not present

## 2021-10-22 NOTE — Therapy (Signed)
OUTPATIENT PHYSICAL THERAPY CERVICAL EVALUATION   Patient Name: Peter Bryan MRN: [id number] DOB:12-25-1960, 61 y.o., male Today's Date: 10/23/2021   PT End of Session - 10/22/21 1617     Visit Number 1    Number of Visits 12    Date for PT Re-Evaluation 12/03/21    Authorization Type [employer] MCR    PT Start Time 1520    PT Stop Time 1605    PT Time Calculation (min) 45 min    Activity Tolerance Patient tolerated treatment well    Behavior During Therapy WFL for tasks assessed/performed             Past Medical History:  Diagnosis Date   Chronic kidney disease    Diabetes mellitus without complication ([place])    Hypertension    Neuromuscular disorder ([place])    neuropathy feet   Obesity    Sleep apnea    Past Surgical History:  Procedure Laterality Date   BACK SURGERY  1/92-08/2016   multiple levels. C5-S1   CARPAL TUNNEL RELEASE     CERVICAL SPINE SURGERY     ROTATOR CUFF REPAIR     SHOULDER ARTHROSCOPY WITH OPEN ROTATOR CUFF REPAIR AND DISTAL CLAVICLE ACROMINECTOMY Right 03/16/2019   Procedure: SHOULDER ARTHROSCOPY WITH OPEN ROTATOR CUFF REPAIR AND DISTAL CLAVICLE ACROMINECTOMY;  Surgeon: Earlie Server, MD;  Location: [facility];  Service: Orthopedics;  Laterality: Right;   Patient Active Problem List   Diagnosis Date Noted   Acute renal failure superimposed on stage 4 chronic kidney disease ([place]) 10/03/2021   OBSTRUCTIVE SLEEP APNEA 10/16/2009   OBESITY 03/13/2007   SLEEP APNEA 03/10/2007    REFERRING PROVIDER: Ignacia Bayley, PA-C   REFERRING DIAG: [phone] (ICD-10-CM) - Spinal stenosis, cervical region    THERAPY DIAG:  Cervicalgia  Muscle weakness (generalized)  Stiffness of left shoulder, not elsewhere classified  Stiffness of right shoulder, not elsewhere classified  Stiffness of left hand, not elsewhere classified  Stiffness of right hand, not elsewhere classified  Rationale for Evaluation and Treatment Rehabilitation  ONSET DATE:  Exacerbation approx 09/10/2021  SUBJECTIVE:                                                                                                                                                                                                         SUBJECTIVE STATEMENT:  Pt has a hx of spinal pain and reports having 12 spinal surgeries.  He has received cortisone injections which helped some.  Pt reports having 2 anterior fusions and 1 posterior fusion.  His most recent cervical  surgery was revision ACDF C3-5 on 03/22/16 with Dr. Patrice Paradise.  Pt is currently fused C3-T1 (C3-6 anterior, C5-T1 posterior).  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   Pt fell and struck his head in January 2021.  He had increased numbness in his hands and balance issues though did not damage his fusions.  He began having balance issues and started using a [place].   Pt underwent revision T7-L1 PSF on 03/02/21.  He is currently fused T7-pelvis.  Pt had a fall 6 weeks ago which has exacerbated his sx's.  He states the sensation of his skin is approx 50% over his entire body and he only has 10% feeling in his hands.  He feels like his UE mm are always contracted and tight.  He is unable to reach over his head.  Pt has to use [place] ambulating in home.  Pt walks minimally outside of home.  He is very limited with ambulation distance due to becoming fatigued.  He has increased difficulty and fatigue with dressing.  Pt states he drops objects a lot and states his hands are very slick.  He has numbness in bilat hands.  Pt has difficulty with drinking and eating.  He has to use 2 hands to pick up drink.  Pt reports having weakness in cervical.   He is limited with turning head to R and feels that he strained his L side several weeks ago.  He states his fingers feel tight.   PERTINENT HISTORY:  -Hx of multiple  cervical surgeries including 2 anterior fusions and 1 posterior fusions -Hx of thoracic and lumbar surgeries.  revision T7-L1 posterior spinal fusion on 03/02/21.  He is currently fused from T7-pelvis.  -Pt uses electric scooter and is limited with mobility  -stage 3 kidney disease, DM type 2, PVD, R RCR in 2021, L RCR approx 2017-2018  PAIN:  Are you having pain? Yes NPRS:  Current and best:  2/10, Worst:  5/10  Location:  cervical and bilat UE  PRECAUTIONS: Fall and Other: multiple cervical, thoracic, and lumbar surgeries including fusion, bilat RCR, decreased sensation  WEIGHT BEARING RESTRICTIONS Pt states 10# limit  FALLS:  Has patient fallen in last 6 months? Yes. Number of falls 1    PLOF: Independent and Independent with household mobility with device   Pt does not ambulate community distance and uses electric scooter.  Pt was limited with ambulation and uses a [place] in home.    PATIENT GOALS learn how to navigate his condition, improve dressing, reduce tightness  OBJECTIVE:   DIAGNOSTIC FINDINGS:  MRI on June 2023:  worsening degeneration at C1-2 and C2-3 with cord compression at both levels. 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  Elsewhere as stable exam as described.    PATIENT SURVEYS:  FOTO next visit   COGNITION: Overall cognitive status: Within functional limits for tasks assessed   SENSATION: 2+ sensation to LT t/o bilat UE dermatomes  POSTURE: rounded shoulders and forward head  PALPATION: Pt had no tenderness in bilat UT.  Minimal tenderness in cervical with L side worse.    CERVICAL ROM:   Active ROM A/PROM (deg) eval  Flexion 18  Extension NT  Right lateral flexion NT  Left lateral flexion NT  Right rotation 20%  Left rotation 40%    UPPER EXTREMITY ROM:  Active ROM Right eval  Left eval  Shoulder flexion 94 71  Shoulder extension    Shoulder abduction    Shoulder adduction    Shoulder extension    Shoulder internal  rotation    Shoulder external rotation    Elbow flexion    Elbow extension    Wrist flexion    Wrist extension    Wrist ulnar deviation    Wrist radial deviation    Wrist pronation    Wrist supination     (Blank rows = not tested)  Pt has difficulty with opposition and unable to reach pinky finger on L.     FUNCTIONAL TESTS:  Pt was very slow and has difficulty with transferring from electric scooter to/from table.    TODAY'S TREATMENT:  Pt performed scap retraction with 3 sec hold approx 10-12 reps seated.  Pt performed opposition in bilat hands approx 10 reps.  Pt received a HEP handout and was educated in correct form and appropriate frequency.  Pt instructed he should not have increased pain with HEP.    PATIENT EDUCATION:  Education details: HEP, objective findings, POC, dx, and rationale of exercises.  PT answered Pt's questions.  Person educated: Patient Education method: Explanation, Demonstration, Tactile cues, Verbal cues, and Handouts Education comprehension: verbalized understanding, returned demonstration, verbal cues required, tactile cues required, and needs further education   HOME EXERCISE PROGRAM: Access Code: [email or web address] URL: [email or web address] Date: 10/22/2021 Prepared by: Ronny Flurry  Exercises - Seated Scapular Retraction  - 2-3 x daily - 7 x weekly - 1 sets - 10 reps - 3 second hold - Thumb Opposition  - 3 x daily - 7 x weekly - 1-2 sets - 5 reps  ASSESSMENT:  CLINICAL IMPRESSION: Patient is a 61  y.o. male with a dx of cervical spinal stenosis presenting to the clinic with cervicalgia, muscle weakness, and limited ROM in hands, shoulder, and cervical.  Pt c/o's of having decreased sensation all over his body and significant numbness in bilat hands.  He is 9.5 months s/p revision T7-L1 posterior spinal fusion on 03/02/21.  Pt had an exacerbation of sx's when he fell 6 weeks ago.  Pt is unable to reach over his head.  Pt becomes  fatigued with activity having limited tolerance to activity.  Pt has difficulty with self care activities including drinking, eating, and dressing.  Pt has issues with dropping objects and has to use 2 hands to pick up his drink.  Pt also has limitations and difficulty with functional mobility including ambulation and transfers.  He uses an Transport planner in the community.  Pt should benefit from skilled PT services to address impairments and improve overall function.   OBJECTIVE IMPAIRMENTS Abnormal gait, decreased activity tolerance, decreased balance, decreased endurance, decreased mobility, difficulty walking, decreased ROM, decreased strength, hypomobility, increased fascial restrictions, increased muscle spasms, impaired flexibility, impaired sensation, impaired UE functional use, postural dysfunction, and pain.   ACTIVITY LIMITATIONS carrying, lifting, standing, transfers, dressing, self feeding, reach over head, hygiene/grooming, and locomotion level  PARTICIPATION LIMITATIONS: meal prep, cleaning, laundry, shopping, and community activity  PERSONAL FACTORS 3+ comorbidities: multiple cervical, thoracic, and lumbar surgeries including fusion, bilat RCR, decreased sensation  are also affecting patient's functional outcome.   REHAB POTENTIAL: Good  CLINICAL DECISION MAKING: Evolving/moderate complexity  EVALUATION COMPLEXITY: Moderate   GOALS:   SHORT TERM GOALS: Target date: 11/12/2021  Pt will be independent and compliant with HEP for improved pain, ROM, strength, and function.  Baseline:  Goal status: INITIAL  2.  Pt will report at least a 25% improvement in performing self care activities.  Baseline:  Goal status: INITIAL  3.  Pt will be able to lift his cup to drink using 1 hand without difficulty.  Baseline:  Goal status: INITIAL  4.  Pt will demonstrate at least a 25 deg increase in bilat shoulder flexion AROM for improved reaching.  Baseline:  Goal status:  INITIAL  5.  Pt will be able to oppose the 5th digit smoothly for improved dexterity in bilat hands for improved manipulation of objects.  Baseline:  Goal status: INITIAL    LONG TERM GOALS: Target date: 12/03/2021  Pt will report at least a 60% improvement in tightness and sx's in cervical and thoracic region for improved performance of daily activities.  Baseline:  Goal status: INITIAL  2.  Pt will report he is able to eat and drink without significant difficulty and pain.  Baseline:  Goal status: INITIAL  3.  Pt will report he is able to reach overhead into cabinet.  Baseline:  Goal status: INITIAL  4.  Pt will report he is able to perform dressing without significant pain and difficulty.  Baseline:  Goal status: INITIAL     PLAN: PT FREQUENCY: 2x/week  PT DURATION: 6 weeks  PLANNED INTERVENTIONS: Therapeutic exercises, Therapeutic activity, Neuromuscular re-education, Balance training, Gait training, Patient/Family education, Self Care, Joint mobilization, DME instructions, Aquatic Therapy, Dry Needling, Electrical stimulation, Taping, Manual therapy, and Re-evaluation  PLAN FOR NEXT SESSION:  Review and perform HEP.  Scapular and postural exercises.  Finger tendon gliding exercises and putty gripping. STM to bilat UT.  FOTO next visit.  Pt states he has a 10# lifting limit.  Pt is in Transport planner.   Selinda Michaels III PT, DPT 10/23/21 4:36 PM

## 2021-10-28 ENCOUNTER — Encounter (HOSPITAL_BASED_OUTPATIENT_CLINIC_OR_DEPARTMENT_OTHER): Payer: Self-pay | Admitting: Physical Therapy

## 2021-10-28 ENCOUNTER — Ambulatory Visit (HOSPITAL_BASED_OUTPATIENT_CLINIC_OR_DEPARTMENT_OTHER): Payer: Medicare HMO | Admitting: Physical Therapy

## 2021-10-28 DIAGNOSIS — M542 Cervicalgia: Secondary | ICD-10-CM | POA: Diagnosis not present

## 2021-10-28 DIAGNOSIS — M25612 Stiffness of left shoulder, not elsewhere classified: Secondary | ICD-10-CM

## 2021-10-28 DIAGNOSIS — M25611 Stiffness of right shoulder, not elsewhere classified: Secondary | ICD-10-CM | POA: Diagnosis not present

## 2021-10-28 DIAGNOSIS — M25642 Stiffness of left hand, not elsewhere classified: Secondary | ICD-10-CM

## 2021-10-28 DIAGNOSIS — M4802 Spinal stenosis, cervical region: Secondary | ICD-10-CM | POA: Diagnosis not present

## 2021-10-28 DIAGNOSIS — M6281 Muscle weakness (generalized): Secondary | ICD-10-CM

## 2021-10-28 DIAGNOSIS — M25641 Stiffness of right hand, not elsewhere classified: Secondary | ICD-10-CM | POA: Diagnosis not present

## 2021-10-28 NOTE — Therapy (Signed)
OUTPATIENT PHYSICAL THERAPY CERVICAL EVALUATION   Patient Name: Peter Bryan MRN: [id number] DOB:October 29, 1960, 61 y.o., male Today's Date: 10/28/2021   PT End of Session - 10/28/21 1103     Visit Number 2    Number of Visits 12    Date for PT Re-Evaluation 12/03/21    Authorization Type [employer] MCR    PT Start Time 1020   arrives late   PT Stop Time 1100    PT Time Calculation (min) 40 min    Activity Tolerance Patient tolerated treatment well    Behavior During Therapy [facility] for tasks assessed/performed              Past Medical History:  Diagnosis Date   Chronic kidney disease    Diabetes mellitus without complication ([place])    Hypertension    Neuromuscular disorder ([place])    neuropathy feet   Obesity    Sleep apnea    Past Surgical History:  Procedure Laterality Date   BACK SURGERY  1/92-08/2016   multiple levels. C5-S1   CARPAL TUNNEL RELEASE     CERVICAL SPINE SURGERY     ROTATOR CUFF REPAIR     SHOULDER ARTHROSCOPY WITH OPEN ROTATOR CUFF REPAIR AND DISTAL CLAVICLE ACROMINECTOMY Right 03/16/2019   Procedure: SHOULDER ARTHROSCOPY WITH OPEN ROTATOR CUFF REPAIR AND DISTAL CLAVICLE ACROMINECTOMY;  Surgeon: Earlie Server, MD;  Location: [facility];  Service: Orthopedics;  Laterality: Right;   Patient Active Problem List   Diagnosis Date Noted   Acute renal failure superimposed on stage 4 chronic kidney disease ([place]) 10/03/2021   OBSTRUCTIVE SLEEP APNEA 10/16/2009   OBESITY 03/13/2007   SLEEP APNEA 03/10/2007    REFERRING PROVIDER: Ignacia Bayley, PA-C   REFERRING DIAG: [phone] (ICD-10-CM) - Spinal stenosis, cervical region    THERAPY DIAG:  Cervicalgia  Muscle weakness (generalized)  Stiffness of left shoulder, not elsewhere classified  Stiffness of right shoulder, not elsewhere classified  Stiffness of left hand, not elsewhere classified  Stiffness of right hand, not elsewhere classified  Rationale for Evaluation and Treatment  Rehabilitation  ONSET DATE: Exacerbation approx 09/10/2021  SUBJECTIVE:                                                                                                                                                                                                         SUBJECTIVE STATEMENT:  Pt states no significant change since last session.    Eval:   Pt has a hx of spinal pain and reports having 12 spinal surgeries.  He has received cortisone injections which  helped some.  Pt reports having 2 anterior fusions and 1 posterior fusion.  His most recent cervical surgery was revision ACDF C3-5 on 03/22/16 with Dr. Patrice Paradise.  Pt is currently fused C3-T1 (C3-6 anterior, C5-T1 posterior).  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   Pt fell and struck his head in January 2021.  He had increased numbness in his hands and balance issues though did not damage his fusions.  He began having balance issues and started using a [place].   Pt underwent revision T7-L1 PSF on 03/02/21.  He is currently fused T7-pelvis.  Pt had a fall 6 weeks ago which has exacerbated his sx's.  He states the sensation of his skin is approx 50% over his entire body and he only has 10% feeling in his hands.  He feels like his UE mm are always contracted and tight.  He is unable to reach over his head.  Pt has to use [place] ambulating in home.  Pt walks minimally outside of home.  He is very limited with ambulation distance due to becoming fatigued.  He has increased difficulty and fatigue with dressing.  Pt states he drops objects a lot and states his hands are very slick.  He has numbness in bilat hands.  Pt has difficulty with drinking and eating.  He has to use 2 hands to pick up drink.  Pt reports having weakness in cervical.   He is limited with turning head to R and feels that he strained his L side  several weeks ago.  He states his fingers feel tight.   PERTINENT HISTORY:  -Hx of multiple cervical surgeries including 2 anterior fusions and 1 posterior fusions -Hx of thoracic and lumbar surgeries.  revision T7-L1 posterior spinal fusion on 03/02/21.  He is currently fused from T7-pelvis.  -Pt uses electric scooter and is limited with mobility  -stage 3 kidney disease, DM type 2, PVD, R RCR in 2021, L RCR approx 2017-2018  PAIN:  Are you having pain? Yes NPRS:  Current and best:  2/10, Worst:  5/10  Location:  cervical and bilat UE  PRECAUTIONS: Fall and Other: multiple cervical, thoracic, and lumbar surgeries including fusion, bilat RCR, decreased sensation  WEIGHT BEARING RESTRICTIONS Pt states 10# limit  FALLS:  Has patient fallen in last 6 months? Yes. Number of falls 1    PLOF: Independent and Independent with household mobility with device   Pt does not ambulate community distance and uses electric scooter.  Pt was limited with ambulation and uses a [place] in home.    PATIENT GOALS learn how to navigate his condition, improve dressing, reduce tightness  OBJECTIVE:   DIAGNOSTIC FINDINGS:  MRI on June 2023:  worsening degeneration at C1-2 and C2-3 with cord compression at both levels. 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  Elsewhere as stable exam as described.    PATIENT SURVEYS:  FOTO next visit   TODAY'S TREATMENT:    Exercises - Thumb Opposition  - 3 x daily - 7 x weekly - 1-2 sets - 5 reps - Shoulder External Rotation and Scapular Retraction with Resistance  - 1 x daily - 7 x weekly - 2 sets - 8 reps - Standing Shoulder Row with Anchored Resistance  - 1 x daily - 7 x weekly - 2 sets - 10 reps - Finger AROM FDS tendon  gliding  - 1 x daily - 7 x weekly - 2 sets - 10 reps - Sit to Stand with Armchair  - 1 x daily - 7 x weekly - 2 sets - 5 reps  Increasing NEAT/GPP through daily walking   PATIENT EDUCATION:  Education details: exercise progression,  DOMS expectations, muscle firing,  envelope of function, HEP, POC Person educated: Patient Education method: Explanation, Demonstration, Tactile cues, Verbal cues, and Handouts Education comprehension: verbalized understanding, returned demonstration, verbal cues required, tactile cues required, and needs further education   HOME EXERCISE PROGRAM: Access Code: [email or web address] URL: [email or web address] Date: 10/22/2021 Prepared by: Ronny Flurry   ASSESSMENT:  CLINICAL IMPRESSION: Pt able to continue with progression of UE exercise at today's session and intro to more functional mobility tasks today. Pt without pain during session but does have UE and LE fatigue. Pt with improved transfer mechanics when given edu about centering COG over BOS. Pt gave verbal understanding to edu about increasing daily NEAT as well as beginning safe walking program in order to improve functional endurance. Plan to continue with exercise as tolerated. Pt should benefit from skilled PT services to address impairments and improve overall function.   OBJECTIVE IMPAIRMENTS Abnormal gait, decreased activity tolerance, decreased balance, decreased endurance, decreased mobility, difficulty walking, decreased ROM, decreased strength, hypomobility, increased fascial restrictions, increased muscle spasms, impaired flexibility, impaired sensation, impaired UE functional use, postural dysfunction, and pain.   ACTIVITY LIMITATIONS carrying, lifting, standing, transfers, dressing, self feeding, reach over head, hygiene/grooming, and locomotion level  PARTICIPATION LIMITATIONS: meal prep, cleaning, laundry, shopping, and community activity  PERSONAL FACTORS 3+ comorbidities: multiple cervical, thoracic, and lumbar surgeries including fusion, bilat RCR, decreased sensation  are also affecting patient's functional outcome.   REHAB POTENTIAL: Good  CLINICAL DECISION MAKING: Evolving/moderate complexity  EVALUATION  COMPLEXITY: Moderate   GOALS:   SHORT TERM GOALS: Target date: 11/12/2021  Pt will be independent and compliant with HEP for improved pain, ROM, strength, and function.  Baseline:  Goal status: INITIAL  2.  Pt will report at least a 25% improvement in performing self care activities.  Baseline:  Goal status: INITIAL  3.  Pt will be able to lift his cup to drink using 1 hand without difficulty.  Baseline:  Goal status: INITIAL  4.  Pt will demonstrate at least a 25 deg increase in bilat shoulder flexion AROM for improved reaching.  Baseline:  Goal status: INITIAL  5.  Pt will be able to oppose the 5th digit smoothly for improved dexterity in bilat hands for improved manipulation of objects.  Baseline:  Goal status: INITIAL    LONG TERM GOALS: Target date: 12/03/2021  Pt will report at least a 60% improvement in tightness and sx's in cervical and thoracic region for improved performance of daily activities.  Baseline:  Goal status: INITIAL  2.  Pt will report he is able to eat and drink without significant difficulty and pain.  Baseline:  Goal status: INITIAL  3.  Pt will report he is able to reach overhead into cabinet.  Baseline:  Goal status: INITIAL  4.  Pt will report he is able to perform dressing without significant pain and difficulty.  Baseline:  Goal status: INITIAL     PLAN: PT FREQUENCY: 2x/week  PT DURATION: 6 weeks  PLANNED INTERVENTIONS: Therapeutic exercises, Therapeutic activity, Neuromuscular re-education, Balance training, Gait training, Patient/Family education, Self Care, Joint mobilization, DME instructions, Aquatic Therapy, Dry Needling, Electrical stimulation, Taping, Manual therapy, and Re-evaluation  PLAN  FOR NEXT SESSION:  Review and perform HEP.  Scapular and postural exercises.  Finger tendon gliding exercises and putty gripping. STM to bilat UT.  FOTO next visit.  Pt states he has a 10# lifting limit.  Pt is in Transport planner.  Address neck stiffness  Daleen Bo PT, DPT 10/28/21 11:04 AM

## 2021-10-29 DIAGNOSIS — R140 Abdominal distension (gaseous): Secondary | ICD-10-CM | POA: Diagnosis not present

## 2021-10-29 DIAGNOSIS — K5903 Drug induced constipation: Secondary | ICD-10-CM | POA: Diagnosis not present

## 2021-11-05 ENCOUNTER — Ambulatory Visit (HOSPITAL_BASED_OUTPATIENT_CLINIC_OR_DEPARTMENT_OTHER): Payer: Medicare HMO | Admitting: Physical Therapy

## 2021-11-12 ENCOUNTER — Ambulatory Visit (HOSPITAL_BASED_OUTPATIENT_CLINIC_OR_DEPARTMENT_OTHER): Payer: Medicare HMO | Attending: Physician Assistant | Admitting: Physical Therapy

## 2021-11-12 DIAGNOSIS — M25642 Stiffness of left hand, not elsewhere classified: Secondary | ICD-10-CM | POA: Insufficient documentation

## 2021-11-12 DIAGNOSIS — M25641 Stiffness of right hand, not elsewhere classified: Secondary | ICD-10-CM | POA: Diagnosis not present

## 2021-11-12 DIAGNOSIS — Z79899 Other long term (current) drug therapy: Secondary | ICD-10-CM | POA: Diagnosis not present

## 2021-11-12 DIAGNOSIS — M542 Cervicalgia: Secondary | ICD-10-CM | POA: Diagnosis not present

## 2021-11-12 DIAGNOSIS — G894 Chronic pain syndrome: Secondary | ICD-10-CM | POA: Diagnosis not present

## 2021-11-12 DIAGNOSIS — Z79891 Long term (current) use of opiate analgesic: Secondary | ICD-10-CM | POA: Diagnosis not present

## 2021-11-12 DIAGNOSIS — M6281 Muscle weakness (generalized): Secondary | ICD-10-CM | POA: Diagnosis not present

## 2021-11-12 DIAGNOSIS — M25611 Stiffness of right shoulder, not elsewhere classified: Secondary | ICD-10-CM | POA: Diagnosis not present

## 2021-11-12 DIAGNOSIS — M25612 Stiffness of left shoulder, not elsewhere classified: Secondary | ICD-10-CM | POA: Insufficient documentation

## 2021-11-12 DIAGNOSIS — I10 Essential (primary) hypertension: Secondary | ICD-10-CM | POA: Diagnosis not present

## 2021-11-12 DIAGNOSIS — Z6841 Body Mass Index (BMI) 40.0 and over, adult: Secondary | ICD-10-CM | POA: Diagnosis not present

## 2021-11-12 DIAGNOSIS — M4325 Fusion of spine, thoracolumbar region: Secondary | ICD-10-CM | POA: Diagnosis not present

## 2021-11-12 DIAGNOSIS — M4802 Spinal stenosis, cervical region: Secondary | ICD-10-CM | POA: Diagnosis not present

## 2021-11-12 DIAGNOSIS — N184 Chronic kidney disease, stage 4 (severe): Secondary | ICD-10-CM | POA: Diagnosis not present

## 2021-11-12 DIAGNOSIS — E118 Type 2 diabetes mellitus with unspecified complications: Secondary | ICD-10-CM | POA: Diagnosis not present
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
## 2021-11-13 ENCOUNTER — Encounter (HOSPITAL_BASED_OUTPATIENT_CLINIC_OR_DEPARTMENT_OTHER): Payer: Self-pay | Admitting: Physical Therapy

## 2021-11-25 ENCOUNTER — Ambulatory Visit (HOSPITAL_BASED_OUTPATIENT_CLINIC_OR_DEPARTMENT_OTHER): Payer: Medicare HMO | Admitting: Physical Therapy

## 2021-12-02 DIAGNOSIS — E291 Testicular hypofunction: Secondary | ICD-10-CM | POA: Diagnosis not present

## 2021-12-02 DIAGNOSIS — R531 Weakness: Secondary | ICD-10-CM | POA: Diagnosis not present

## 2021-12-02 DIAGNOSIS — N184 Chronic kidney disease, stage 4 (severe): Secondary | ICD-10-CM | POA: Diagnosis not present

## 2021-12-03 ENCOUNTER — Encounter (HOSPITAL_BASED_OUTPATIENT_CLINIC_OR_DEPARTMENT_OTHER): Payer: Medicare HMO | Admitting: Physical Therapy

## 2021-12-03 DIAGNOSIS — N529 Male erectile dysfunction, unspecified: Secondary | ICD-10-CM | POA: Diagnosis not present

## 2021-12-03 DIAGNOSIS — N138 Other obstructive and reflux uropathy: Secondary | ICD-10-CM | POA: Diagnosis not present

## 2021-12-03 DIAGNOSIS — Z125 Encounter for screening for malignant neoplasm of prostate: Secondary | ICD-10-CM | POA: Diagnosis not present

## 2021-12-03 DIAGNOSIS — R7989 Other specified abnormal findings of blood chemistry: Secondary | ICD-10-CM | POA: Diagnosis not present

## 2021-12-03 DIAGNOSIS — N401 Enlarged prostate with lower urinary tract symptoms: Secondary | ICD-10-CM | POA: Diagnosis not present

## 2021-12-03 DIAGNOSIS — E291 Testicular hypofunction: Secondary | ICD-10-CM | POA: Diagnosis not present

## 2021-12-03 DIAGNOSIS — N433 Hydrocele, unspecified: Secondary | ICD-10-CM | POA: Diagnosis not present

## 2021-12-10 ENCOUNTER — Ambulatory Visit (HOSPITAL_BASED_OUTPATIENT_CLINIC_OR_DEPARTMENT_OTHER): Payer: Medicare HMO | Attending: Physician Assistant | Admitting: Physical Therapy

## 2021-12-10 DIAGNOSIS — M25641 Stiffness of right hand, not elsewhere classified: Secondary | ICD-10-CM | POA: Insufficient documentation

## 2021-12-10 DIAGNOSIS — M25611 Stiffness of right shoulder, not elsewhere classified: Secondary | ICD-10-CM | POA: Insufficient documentation

## 2021-12-10 DIAGNOSIS — M25642 Stiffness of left hand, not elsewhere classified: Secondary | ICD-10-CM | POA: Insufficient documentation

## 2021-12-10 DIAGNOSIS — M25612 Stiffness of left shoulder, not elsewhere classified: Secondary | ICD-10-CM | POA: Insufficient documentation

## 2021-12-10 DIAGNOSIS — M6281 Muscle weakness (generalized): Secondary | ICD-10-CM | POA: Insufficient documentation

## 2021-12-10 DIAGNOSIS — M542 Cervicalgia: Secondary | ICD-10-CM | POA: Insufficient documentation

## 2021-12-14 DIAGNOSIS — J208 Acute bronchitis due to other specified organisms: Secondary | ICD-10-CM | POA: Diagnosis not present

## 2021-12-17 ENCOUNTER — Ambulatory Visit (HOSPITAL_BASED_OUTPATIENT_CLINIC_OR_DEPARTMENT_OTHER): Payer: Medicare HMO | Admitting: Physical Therapy

## 2022-01-11 DIAGNOSIS — N184 Chronic kidney disease, stage 4 (severe): Secondary | ICD-10-CM | POA: Diagnosis not present

## 2022-01-13 DIAGNOSIS — R69 Illness, unspecified: Secondary | ICD-10-CM | POA: Diagnosis not present

## 2022-01-15 DIAGNOSIS — I10 Essential (primary) hypertension: Secondary | ICD-10-CM | POA: Diagnosis not present

## 2022-01-15 DIAGNOSIS — M4325 Fusion of spine, thoracolumbar region: Secondary | ICD-10-CM | POA: Diagnosis not present

## 2022-01-15 DIAGNOSIS — Z6841 Body Mass Index (BMI) 40.0 and over, adult: Secondary | ICD-10-CM | POA: Diagnosis not present

## 2022-01-15 DIAGNOSIS — M4802 Spinal stenosis, cervical region: Secondary | ICD-10-CM | POA: Diagnosis not present

## 2022-01-18 DIAGNOSIS — E860 Dehydration: Secondary | ICD-10-CM | POA: Diagnosis not present

## 2022-01-18 DIAGNOSIS — N184 Chronic kidney disease, stage 4 (severe): Secondary | ICD-10-CM | POA: Diagnosis not present

## 2022-01-18 DIAGNOSIS — E875 Hyperkalemia: Secondary | ICD-10-CM | POA: Diagnosis not present

## 2022-01-18 DIAGNOSIS — E118 Type 2 diabetes mellitus with unspecified complications: Secondary | ICD-10-CM | POA: Diagnosis not present

## 2022-01-19 DIAGNOSIS — G4733 Obstructive sleep apnea (adult) (pediatric): Secondary | ICD-10-CM | POA: Diagnosis not present

## 2022-01-20 NOTE — Progress Notes (Signed)
Cardiology Office Note:    Date:  01/21/2022   ID:  Peter Bryan, DOB 1960/07/14, MRN [id number]  PCP:  Linus Mako, NP  Cardiologist:  Buford Dresser, MD  Referring MD: Loraine Leriche.,*   CC: New patient evaluation for hypertension  History of Present Illness:    Peter Bryan is a 62 y.o. male with a hx of hypertension, hyperlipidemia, diabetes, CKD IIIa, peripheral venous insufficiency, neuropathy, obesity, and sleep apnea, who is seen as a new consult, self-referred [name], NP father-in-law) for the evaluation and management of hypertension.  Previously seen in [facility] ED 09/15/2021 with complaints of worsening bilateral LE edema for 1 week. Patient's BNP was only 3 so he did not have any evidence of heart failure, and his creatinine was 2.49 but that was noted to be chronic for him. LE ultrasound imaging was negative for DVT. He reported no improvement of his swelling with 20 mg Lasix. He was discharged with increased Lasix to 40 mg daily for 3 days.  Later seen in the ED 10/03/2021 with abdominal pain and constipation with unclear etiology. He was initially found to be hypotensive. Checked troponin which was normal. EKG initially with poor baseline and likely PACs, rechecked and demonstrated normal sinus rhythm. Baseline creatinine 2.5 increased to 3.9 likely from dehydration. Treated with IV fluids. He was admitted for AKI but there were no beds available. He was instructed to stay hydrated with close follow-up.  Today he is accompanied by a family member. Since his fall in August, he has not felt that he is improving or worsening.  Cardiovascular risk factors: Prior clinical ASCVD: None. Comorbid conditions: Hypertension - He has had hypertension for about 10 years. Occasionally he monitors home BP.  Hyperlipidemia - On rosuvastatin. Diabetes - Diagnosed in 2010. Never needed to be on insulin. Chronic kidney disease - Detected via blood panel 5-6 years  ago. Previously was on 81 mg aspirin until his kidney disease was diagnosed. Of note, he is also on testosterone cypionate once weekly.  Metabolic syndrome/Obesity: Highest adult weight about 375 lbs, currently 307 lbs in clinic today. He attributes his weight loss to his age and not eating as much, especially after his accident last August. He had tried Ozempic in the past, but he had started this right after his fall so he didn't tolerate it. At this time, he has not yet started the Trulicity. Chronic inflammatory conditions: None. Tobacco use history:  Never. Family history: His mother was found to have a leaky mitral valve thought to be 2/2 years of consistent hypertension; also diabetes. He complains of feeling a sensation of "fullness" in his legs, which he notes his mother had as well. His paternal grandmother had heart disease later in life. No other known cardiovascular history in his family. Prior cardiac testing and/or incidental findings on other testing (ie coronary calcium): Exercise level:  "Unable to do just about everything." Mostly he feels limited by residual symptoms since his fall in 08/2021 including partial paralysis/weakness. Most of the time his legs don't want to ambulate; he states he is a fall risk. No touch sensation in his hands. If he does anything very strenuous, he may feel as though he will pass out. However, he is able to stop and rest to prevent actual loss of consciousness. He has a consult soon regarding hydrotherapy.  Current diet:  English muffin and sausage for breakfast. Lunch could be leftovers from dinner. Dinner is a standard meal.  He  denies any palpitations, chest pain, lightheadedness, headaches, syncope, orthopnea, or PND.  Past Medical History:  Diagnosis Date   Chronic kidney disease    Diabetes mellitus without complication (HCC)    Hypertension    Neuromuscular disorder ([place])    neuropathy feet   Obesity    Sleep apnea     Past Surgical  History:  Procedure Laterality Date   BACK SURGERY  1/92-08/2016   multiple levels. C5-S1   CARPAL TUNNEL RELEASE     CERVICAL SPINE SURGERY     ROTATOR CUFF REPAIR     SHOULDER ARTHROSCOPY WITH OPEN ROTATOR CUFF REPAIR AND DISTAL CLAVICLE ACROMINECTOMY Right 03/16/2019   Procedure: SHOULDER ARTHROSCOPY WITH OPEN ROTATOR CUFF REPAIR AND DISTAL CLAVICLE ACROMINECTOMY;  Surgeon: Earlie Server, MD;  Location: [facility];  Service: Orthopedics;  Laterality: Right;    Current Medications: Current Outpatient Medications on File Prior to Visit  Medication Sig   baclofen (LIORESAL) 10 MG tablet Take 10 mg by mouth daily as needed for muscle spasms.   gabapentin (NEURONTIN) 300 MG capsule Take 300 mg by mouth 4 (four) times daily.   glipiZIDE (GLUCOTROL XL) 2.5 MG 24 hr tablet Take 2.5 mg by mouth daily with breakfast.   hydrALAZINE (APRESOLINE) 25 MG tablet Take 25 mg by mouth 2 (two) times daily.   HYDROcodone-acetaminophen (NORCO) 10-325 MG per tablet Take 2-2.5 tablets by mouth See admin instructions. Take 2.5 mg in the morning and at lunch as 2 tables in the evening   JANUVIA 50 MG tablet Take 1 tablet by mouth daily.   LOKELMA 10 g PACK packet Take 10 g by mouth 3 (three) times a week.   magnesium oxide (MAG-OX) 400 MG tablet Take 400 mg by mouth daily.   Peppermint Oil (IBGARD) 90 MG CPCR Take by mouth.   rosuvastatin (CRESTOR) 5 MG tablet Take 5 mg by mouth daily.     sodium bicarbonate 650 MG tablet Take 650 mg by mouth daily.   testosterone cypionate (DEPOTESTOSTERONE CYPIONATE) 200 MG/ML injection Inject 200 mg into the muscle once a week.   thiamine 50 MG tablet Take 50 mg by mouth 2 (two) times daily.   TRULICITY [id number] SOPN Inject 0.75 mg into the skin.   Turmeric (QC TUMERIC COMPLEX PO) Take 4,500 mg by mouth daily at 12 noon.   valsartan-hydrochlorothiazide (DIOVAN-HCT) 320-25 MG tablet Take 1 tablet by mouth at bedtime. Midnight   Vitamin D, Ergocalciferol, (DRISDOL) 1.25 MG  (50000 UNIT) CAPS capsule Take 50,000 Units by mouth 2 (two) times a week.   zolpidem (AMBIEN) 10 MG tablet Take 10 mg by mouth at bedtime.    No current facility-administered medications on file prior to visit.     Allergies:   Patient has no known allergies.   Social History   Tobacco Use   Smoking status: Never   Smokeless tobacco: Never  Vaping Use   Vaping Use: Never used  Substance Use Topics   Alcohol use: Never   Drug use: Never    Family History: His mother was found to have a leaky mitral valve thought to be 2/2 years of consistent hypertension; also diabetes. He complains of feeling a sensation of "fullness" in his legs, which he notes his mother had as well. His paternal grandmother had heart disease later in life. No other known cardiovascular history in his family.  ROS:   Please see the history of present illness.  Additional pertinent ROS: Constitutional: Negative for chills, fever, night sweats,  unintentional weight loss  HENT: Negative for ear pain and hearing loss.   Eyes: Negative for loss of vision and eye pain.  Respiratory: Negative for cough, sputum, wheezing.   Cardiovascular: See HPI. Gastrointestinal: Negative for abdominal pain, melena, and hematochezia.  Genitourinary: Negative for dysuria and hematuria.  Musculoskeletal: Negative for falls and myalgias.  Skin: Negative for itching and rash.  Neurological: Negative for loss of consciousness. Positive for sensation of fullness in his legs. Positive for loss of touch sensation in hands. Positive for partial paralysis/weakness. Endo/Heme/Allergies: Does not bruise/bleed easily.     EKGs/Labs/Other Studies Reviewed:    The following studies were reviewed today:  LE Venous Doppler  09/15/2021: IMPRESSION:  No evidence of acute deep venous thrombosis within either lower  extremity.   Exercise Stress Myoview  11/14/2003: IMPRESSION:   1. No evidence of exercise induced myocardial ischemia, or  myocardial scar. Diaphragmatic attenuation artifact noted.  2. No evidence of regional left ventricular wall motion abnormality. Calculated left ventricular ejection fraction is 51 percent.   EKG:  EKG is personally reviewed.   01/21/2022:  NSR at 64 bpm, iRBBB  Recent Labs: 04/21/2021: Magnesium 1.9 10/03/2021: ALT 22; BUN 95; Creatinine, Ser 3.98; Hemoglobin 13.9; Platelets 223; Potassium 4.7; Sodium 134   Recent Lipid Panel No results found for: "CHOL", "TRIG", "HDL", "CHOLHDL", "VLDL", "LDLCALC", "LDLDIRECT"  Physical Exam:    VS:  BP 132/70 (BP Location: Left Arm, Patient Position: Sitting, Cuff Size: Large)   Pulse 64   Ht 5' 11"$  (1.803 m)   Wt (!) 307 lb 8 oz (139.5 kg)   BMI 42.89 kg/m     Wt Readings from Last 3 Encounters:  01/21/22 (!) 307 lb 8 oz (139.5 kg)  10/03/21 (!) 317 lb (143.8 kg)  12/17/20 (!) 347 lb (157.4 kg)    GEN: Well nourished, well developed in no acute distress HEENT: Normal, moist mucous membranes NECK: No JVD CARDIAC: regular rhythm, normal S1 and S2, no rubs or gallops. No murmur. VASCULAR: Radial and DP pulses 2+ bilaterally. No carotid bruits RESPIRATORY:  Clear to auscultation without rales, wheezing or rhonchi  ABDOMEN: Soft, non-tender, non-distended MUSCULOSKELETAL:  Ambulates independently SKIN: Warm and dry, no edema NEUROLOGIC:  Alert and oriented x 3. No focal neuro deficits noted. PSYCHIATRIC:  Normal affect    ASSESSMENT:    1. Essential hypertension   2. Type 2 diabetes mellitus with complication, without long-term current use of insulin ([place])   3. CKD (chronic kidney disease) stage 4, GFR 15-29 ml/min (HCC)   4. OSA on CPAP   5. Cardiac risk counseling   6. Counseling on health promotion and disease prevention   7. Hyperlipidemia, unspecified hyperlipidemia type   8. Family history of heart disease   9. Class 3 severe obesity due to excess calories with serious comorbidity and body mass index (BMI) of 40.0 to 44.9 in adult  [facility])    PLAN:    Hypertension Stage 4 CKD with proteinuria Type II diabetes OSA on CPAP Hyperlipidemia Obesity, BMI 42 -follows with [facility]rology, Dr. Neta Ehlers -I agree with trulicity ([id number]). [id number] would be another good option once he tolerates the trulicity, as this has both cardiac and renal benefits -limited options for antihypertensives given his renal disease. Currently on valsartan-HCTZ, hydralazine. BP just at goal today. -on rousvastatin given diabetes. Discussed aspirin today as well, he is amenable -continue CPAP  Cardiac risk counseling and prevention recommendations: with risk factors above and family history of heart disease -recommend  heart healthy/Mediterranean diet, with whole grains, fruits, vegetable, fish, lean meats, nuts, and olive oil. Limit salt. -recommend moderate walking, 3-5 times/week for 30-50 minutes each session. Aim for at least 150 minutes.week. Goal should be pace of 3 miles/hours, or walking 1.5 miles in 30 minutes -recommend avoidance of tobacco products. Avoid excess alcohol. -ASCVD risk score: The ASCVD Risk score (Arnett DK, et al., 2019) failed to calculate for the following reasons:   The valid total cholesterol range is 130 to 320 mg/dL    Plan for follow up: 6 months or sooner as needed.  Buford Dresser, MD, PhD, [facility]    Medication Adjustments/Labs and Tests Ordered: Current medicines are reviewed at length with the patient today.  Concerns regarding medicines are outlined above.   Orders Placed This Encounter  Procedures   EKG 12-Lead   Meds ordered this encounter  Medications   aspirin EC 81 MG tablet    Sig: Take 1 tablet (81 mg total) by mouth daily. Swallow whole.    Dispense:  90 tablet    Refill:  3   Patient Instructions  We discussed baby aspirin today (81 mg). Watch for bleeding, especially blood in the stool or urine, or black sticky stool.  It's good that you are going to try  Trulicity. Another good option would be an [place] like [place] and [place]. This is heart and kidney protective, and it may allow you stop the glipizide. I try not to change too many medications at one time, but this would be a good medicine once you are stable on the Trulicity.  FOLLOW UP WITH DR Harrell Gave IN 6 MONTHS    I,Mathew Stumpf,acting as a scribe for [employer], MD.,have documented all relevant documentation on the behalf of Buford Dresser, MD,as directed by  Buford Dresser, MD while in the presence of Buford Dresser, MD.  I, Buford Dresser, MD, have reviewed all documentation for this visit. The documentation on 02/21/22 for the exam, diagnosis, procedures, and orders are all accurate and complete.   Signed, Buford Dresser, MD PhD 01/21/2022     [place]

## 2022-01-21 ENCOUNTER — Encounter (HOSPITAL_BASED_OUTPATIENT_CLINIC_OR_DEPARTMENT_OTHER): Payer: Self-pay | Admitting: Cardiology

## 2022-01-21 ENCOUNTER — Ambulatory Visit (HOSPITAL_BASED_OUTPATIENT_CLINIC_OR_DEPARTMENT_OTHER): Payer: Medicare HMO | Admitting: Cardiology

## 2022-01-21 VITALS — BP 132/70 | HR 64 | Ht 71.0 in | Wt 307.5 lb

## 2022-01-21 DIAGNOSIS — E118 Type 2 diabetes mellitus with unspecified complications: Secondary | ICD-10-CM

## 2022-01-21 DIAGNOSIS — Z8249 Family history of ischemic heart disease and other diseases of the circulatory system: Secondary | ICD-10-CM | POA: Diagnosis not present

## 2022-01-21 DIAGNOSIS — N184 Chronic kidney disease, stage 4 (severe): Secondary | ICD-10-CM | POA: Diagnosis not present

## 2022-01-21 DIAGNOSIS — E785 Hyperlipidemia, unspecified: Secondary | ICD-10-CM

## 2022-01-21 DIAGNOSIS — Z7189 Other specified counseling: Secondary | ICD-10-CM | POA: Diagnosis not present

## 2022-01-21 DIAGNOSIS — Z6841 Body Mass Index (BMI) 40.0 and over, adult: Secondary | ICD-10-CM

## 2022-01-21 DIAGNOSIS — I10 Essential (primary) hypertension: Secondary | ICD-10-CM | POA: Diagnosis not present

## 2022-01-21 DIAGNOSIS — G4733 Obstructive sleep apnea (adult) (pediatric): Secondary | ICD-10-CM | POA: Diagnosis not present

## 2022-01-21 MED ORDER — ASPIRIN 81 MG PO TBEC: 81.0000 mg | DELAYED_RELEASE_TABLET | Freq: Every day | ORAL | 3 refills | Status: DC

## 2022-01-21 NOTE — Patient Instructions (Addendum)
We discussed baby aspirin today (81 mg). Watch for bleeding, especially blood in the stool or urine, or black sticky stool.  It's good that you are going to try Trulicity. Another good option would be an [place] like [place] and [place]. This is heart and kidney protective, and it may allow you stop the glipizide. I try not to change too many medications at one time, but this would be a good medicine once you are stable on the Trulicity.  FOLLOW UP WITH DR Harrell Gave IN 6 MONTHS

## 2022-01-28 DIAGNOSIS — S83209A Unspecified tear of unspecified meniscus, current injury, unspecified knee, initial encounter: Secondary | ICD-10-CM | POA: Diagnosis not present

## 2022-01-28 DIAGNOSIS — M1611 Unilateral primary osteoarthritis, right hip: Secondary | ICD-10-CM | POA: Diagnosis not present

## 2022-01-28 DIAGNOSIS — M7061 Trochanteric bursitis, right hip: Secondary | ICD-10-CM | POA: Diagnosis not present

## 2022-01-28 DIAGNOSIS — M25569 Pain in unspecified knee: Secondary | ICD-10-CM | POA: Diagnosis not present

## 2022-01-28 DIAGNOSIS — M170 Bilateral primary osteoarthritis of knee: Secondary | ICD-10-CM | POA: Diagnosis not present

## 2022-01-29 DIAGNOSIS — M1611 Unilateral primary osteoarthritis, right hip: Secondary | ICD-10-CM | POA: Diagnosis not present

## 2022-02-03 DIAGNOSIS — R69 Illness, unspecified: Secondary | ICD-10-CM | POA: Diagnosis not present

## 2022-02-04 DIAGNOSIS — M4327 Fusion of spine, lumbosacral region: Secondary | ICD-10-CM | POA: Diagnosis not present

## 2022-02-04 DIAGNOSIS — M25551 Pain in right hip: Secondary | ICD-10-CM | POA: Diagnosis not present

## 2022-02-04 DIAGNOSIS — M4322 Fusion of spine, cervical region: Secondary | ICD-10-CM | POA: Diagnosis not present

## 2022-02-04 DIAGNOSIS — M4326 Fusion of spine, lumbar region: Secondary | ICD-10-CM | POA: Diagnosis not present

## 2022-02-10 DIAGNOSIS — Z6841 Body Mass Index (BMI) 40.0 and over, adult: Secondary | ICD-10-CM | POA: Diagnosis not present

## 2022-02-10 DIAGNOSIS — I129 Hypertensive chronic kidney disease with stage 1 through stage 4 chronic kidney disease, or unspecified chronic kidney disease: Secondary | ICD-10-CM | POA: Diagnosis not present

## 2022-02-10 DIAGNOSIS — E1122 Type 2 diabetes mellitus with diabetic chronic kidney disease: Secondary | ICD-10-CM | POA: Diagnosis not present

## 2022-02-10 DIAGNOSIS — N184 Chronic kidney disease, stage 4 (severe): Secondary | ICD-10-CM | POA: Diagnosis not present

## 2022-02-10 DIAGNOSIS — E875 Hyperkalemia: Secondary | ICD-10-CM | POA: Diagnosis not present

## 2022-02-11 DIAGNOSIS — M4322 Fusion of spine, cervical region: Secondary | ICD-10-CM | POA: Diagnosis not present

## 2022-02-11 DIAGNOSIS — M4326 Fusion of spine, lumbar region: Secondary | ICD-10-CM | POA: Diagnosis not present

## 2022-02-11 DIAGNOSIS — M4327 Fusion of spine, lumbosacral region: Secondary | ICD-10-CM | POA: Diagnosis not present

## 2022-02-18 DIAGNOSIS — M4327 Fusion of spine, lumbosacral region: Secondary | ICD-10-CM | POA: Diagnosis not present

## 2022-02-18 DIAGNOSIS — M4326 Fusion of spine, lumbar region: Secondary | ICD-10-CM | POA: Diagnosis not present

## 2022-02-18 DIAGNOSIS — M4322 Fusion of spine, cervical region: Secondary | ICD-10-CM | POA: Diagnosis not present

## 2022-02-22 DIAGNOSIS — M4322 Fusion of spine, cervical region: Secondary | ICD-10-CM | POA: Diagnosis not present

## 2022-02-22 DIAGNOSIS — M4327 Fusion of spine, lumbosacral region: Secondary | ICD-10-CM | POA: Diagnosis not present

## 2022-02-22 DIAGNOSIS — M4326 Fusion of spine, lumbar region: Secondary | ICD-10-CM | POA: Diagnosis not present

## 2022-02-25 DIAGNOSIS — M1611 Unilateral primary osteoarthritis, right hip: Secondary | ICD-10-CM | POA: Diagnosis not present

## 2022-03-01 DIAGNOSIS — M4322 Fusion of spine, cervical region: Secondary | ICD-10-CM | POA: Diagnosis not present

## 2022-03-01 DIAGNOSIS — M4326 Fusion of spine, lumbar region: Secondary | ICD-10-CM | POA: Diagnosis not present

## 2022-03-01 DIAGNOSIS — M4327 Fusion of spine, lumbosacral region: Secondary | ICD-10-CM | POA: Diagnosis not present

## 2022-03-04 DIAGNOSIS — R69 Illness, unspecified: Secondary | ICD-10-CM | POA: Diagnosis not present

## 2022-03-04 DIAGNOSIS — K5900 Constipation, unspecified: Secondary | ICD-10-CM | POA: Diagnosis not present

## 2022-03-11 DIAGNOSIS — K648 Other hemorrhoids: Secondary | ICD-10-CM | POA: Diagnosis not present

## 2022-03-11 DIAGNOSIS — D122 Benign neoplasm of ascending colon: Secondary | ICD-10-CM | POA: Diagnosis not present

## 2022-03-11 DIAGNOSIS — R194 Change in bowel habit: Secondary | ICD-10-CM | POA: Diagnosis not present

## 2022-03-15 DIAGNOSIS — M4327 Fusion of spine, lumbosacral region: Secondary | ICD-10-CM | POA: Diagnosis not present

## 2022-03-15 DIAGNOSIS — M4322 Fusion of spine, cervical region: Secondary | ICD-10-CM | POA: Diagnosis not present

## 2022-03-15 DIAGNOSIS — M4326 Fusion of spine, lumbar region: Secondary | ICD-10-CM | POA: Diagnosis not present

## 2022-03-18 DIAGNOSIS — M4327 Fusion of spine, lumbosacral region: Secondary | ICD-10-CM | POA: Diagnosis not present

## 2022-03-18 DIAGNOSIS — M4322 Fusion of spine, cervical region: Secondary | ICD-10-CM | POA: Diagnosis not present

## 2022-03-18 DIAGNOSIS — M4326 Fusion of spine, lumbar region: Secondary | ICD-10-CM | POA: Diagnosis not present

## 2022-03-19 DIAGNOSIS — E291 Testicular hypofunction: Secondary | ICD-10-CM | POA: Diagnosis not present

## 2022-03-19 DIAGNOSIS — N401 Enlarged prostate with lower urinary tract symptoms: Secondary | ICD-10-CM | POA: Diagnosis not present

## 2022-03-19 DIAGNOSIS — Z125 Encounter for screening for malignant neoplasm of prostate: Secondary | ICD-10-CM | POA: Diagnosis not present

## 2022-03-19 DIAGNOSIS — N138 Other obstructive and reflux uropathy: Secondary | ICD-10-CM | POA: Diagnosis not present

## 2022-03-22 DIAGNOSIS — M4802 Spinal stenosis, cervical region: Secondary | ICD-10-CM | POA: Diagnosis not present

## 2022-03-22 DIAGNOSIS — Z6841 Body Mass Index (BMI) 40.0 and over, adult: Secondary | ICD-10-CM | POA: Diagnosis not present

## 2022-03-24 DIAGNOSIS — K5900 Constipation, unspecified: Secondary | ICD-10-CM | POA: Diagnosis not present

## 2022-03-24 DIAGNOSIS — E118 Type 2 diabetes mellitus with unspecified complications: Secondary | ICD-10-CM | POA: Diagnosis not present

## 2022-03-24 DIAGNOSIS — R5383 Other fatigue: Secondary | ICD-10-CM | POA: Diagnosis not present

## 2022-03-24 DIAGNOSIS — N184 Chronic kidney disease, stage 4 (severe): Secondary | ICD-10-CM | POA: Diagnosis not present

## 2022-03-24 DIAGNOSIS — E860 Dehydration: Secondary | ICD-10-CM | POA: Diagnosis not present

## 2022-03-24 DIAGNOSIS — I10 Essential (primary) hypertension: Secondary | ICD-10-CM | POA: Diagnosis not present

## 2022-03-24 DIAGNOSIS — E875 Hyperkalemia: Secondary | ICD-10-CM | POA: Diagnosis not present

## 2022-03-25 DIAGNOSIS — M4326 Fusion of spine, lumbar region: Secondary | ICD-10-CM | POA: Diagnosis not present

## 2022-03-25 DIAGNOSIS — M4322 Fusion of spine, cervical region: Secondary | ICD-10-CM | POA: Diagnosis not present

## 2022-03-25 DIAGNOSIS — M4327 Fusion of spine, lumbosacral region: Secondary | ICD-10-CM | POA: Diagnosis not present

## 2022-03-27 DIAGNOSIS — M4322 Fusion of spine, cervical region: Secondary | ICD-10-CM | POA: Diagnosis not present

## 2022-03-27 DIAGNOSIS — M4326 Fusion of spine, lumbar region: Secondary | ICD-10-CM | POA: Diagnosis not present

## 2022-03-27 DIAGNOSIS — M4327 Fusion of spine, lumbosacral region: Secondary | ICD-10-CM | POA: Diagnosis not present

## 2022-04-01 DIAGNOSIS — M4326 Fusion of spine, lumbar region: Secondary | ICD-10-CM | POA: Diagnosis not present

## 2022-04-01 DIAGNOSIS — M4327 Fusion of spine, lumbosacral region: Secondary | ICD-10-CM | POA: Diagnosis not present

## 2022-04-01 DIAGNOSIS — M4322 Fusion of spine, cervical region: Secondary | ICD-10-CM | POA: Diagnosis not present

## 2022-04-04 DIAGNOSIS — R69 Illness, unspecified: Secondary | ICD-10-CM | POA: Diagnosis not present

## 2022-07-01 ENCOUNTER — Ambulatory Visit (HOSPITAL_BASED_OUTPATIENT_CLINIC_OR_DEPARTMENT_OTHER): Payer: Medicare HMO | Admitting: Cardiology

## 2022-07-05 ENCOUNTER — Other Ambulatory Visit: Payer: Self-pay | Admitting: Physician Assistant

## 2022-07-05 DIAGNOSIS — M4322 Fusion of spine, cervical region: Secondary | ICD-10-CM

## 2022-07-05 DIAGNOSIS — M4802 Spinal stenosis, cervical region: Secondary | ICD-10-CM

## 2022-07-06 ENCOUNTER — Telehealth (HOSPITAL_BASED_OUTPATIENT_CLINIC_OR_DEPARTMENT_OTHER): Payer: Self-pay | Admitting: Cardiology

## 2022-07-06 NOTE — Telephone Encounter (Signed)
Spoke with patient regarding Friday 08/27/22 10:00 am --Dr. Cristal Deer is not in the office---new appointment  Friday 09/03/22 at 10:00 am.  Patient voiced his understanding.

## 2022-08-27 ENCOUNTER — Ambulatory Visit (HOSPITAL_BASED_OUTPATIENT_CLINIC_OR_DEPARTMENT_OTHER): Payer: Medicare HMO | Admitting: Cardiology

## 2022-09-03 ENCOUNTER — Encounter (HOSPITAL_BASED_OUTPATIENT_CLINIC_OR_DEPARTMENT_OTHER): Payer: Self-pay | Admitting: Cardiology

## 2022-09-03 ENCOUNTER — Ambulatory Visit (INDEPENDENT_AMBULATORY_CARE_PROVIDER_SITE_OTHER): Payer: Medicare HMO | Admitting: Cardiology

## 2022-09-03 VITALS — BP 134/68 | HR 70 | Ht 71.0 in | Wt 286.9 lb

## 2022-09-03 DIAGNOSIS — G4733 Obstructive sleep apnea (adult) (pediatric): Secondary | ICD-10-CM

## 2022-09-03 DIAGNOSIS — N184 Chronic kidney disease, stage 4 (severe): Secondary | ICD-10-CM | POA: Diagnosis not present

## 2022-09-03 DIAGNOSIS — E8881 Metabolic syndrome: Secondary | ICD-10-CM | POA: Insufficient documentation

## 2022-09-03 DIAGNOSIS — Z6841 Body Mass Index (BMI) 40.0 and over, adult: Secondary | ICD-10-CM

## 2022-09-03 DIAGNOSIS — E118 Type 2 diabetes mellitus with unspecified complications: Secondary | ICD-10-CM

## 2022-09-03 DIAGNOSIS — Z7984 Long term (current) use of oral hypoglycemic drugs: Secondary | ICD-10-CM

## 2022-09-03 DIAGNOSIS — E782 Mixed hyperlipidemia: Secondary | ICD-10-CM

## 2022-09-03 DIAGNOSIS — I10 Essential (primary) hypertension: Secondary | ICD-10-CM

## 2022-09-03 MED ORDER — EMPAGLIFLOZIN 10 MG PO TABS: 10.0000 mg | ORAL_TABLET | Freq: Every day | ORAL | 3 refills | Status: DC

## 2022-09-03 MED ORDER — EMPAGLIFLOZIN 10 MG PO TABS: 10.0000 mg | ORAL_TABLET | Freq: Every day | ORAL | Status: AC

## 2022-09-03 NOTE — Patient Instructions (Addendum)
Medication Instructions:  Jardiance 10 mg take on before breakfast  *If you need a refill on your cardiac medications before your next appointment, please call your pharmacy*   Lab Work: BMET in  3 weeks If you have labs (blood work) drawn today and your tests are completely normal, you will receive your results only by: MyChart Message (if you have MyChart) OR A paper copy in the mail If you have any lab test that is abnormal or we need to change your treatment, we will call you to review the results.   Testing/Procedures: None   Follow-Up: At [facility], you and your health needs are our priority.  As part of our continuing mission to provide you with exceptional heart care, we have created designated Provider Care Teams.  These Care Teams include your primary Cardiologist (physician) and Advanced Practice Providers (APPs -  Physician Assistants and Nurse Practitioners) who all work together to provide you with the care you need, when you need it.  We recommend signing up for the patient portal called "MyChart".  Sign up information is provided on this After Visit Summary.  MyChart is used to connect with patients for Virtual Visits (Telemedicine).  Patients are able to view lab/test results, encounter notes, upcoming appointments, etc.  Non-urgent messages can be sent to your provider as well.   To learn more about what you can do with MyChart, go to [email or web address].    6  month(s)  Provider:   Jodelle Red, MD    Other Instructions + We are going to start Jardiance, as this is heart and kidney protective. Hopefully this will be able to replace either Januvia or glipizide at your follow up. We will recheck your kidney function in a few weeks to make sure this is stable.

## 2022-09-03 NOTE — Progress Notes (Signed)
Cardiology Office Note:  .    Date:  09/03/2022  ID:  Peter Bryan, DOB Jun 20, 1960, MRN [id number] PCP: Vernon Prey, NP  [facility] Providers Cardiologist:  Jodelle Red, MD     History of Present Illness: .    WAKEEM BIALECKI is a 62 y.o. male with a hx of hypertension, hyperlipidemia, diabetes, CKD IIIa, peripheral venous insufficiency, neuropathy, obesity, and sleep apnea, who is seen for follow-up today. I initially met him 01/21/2022 as a new consult, self-referred Gillian Shields, NP father-in-law) for the evaluation and management of hypertension.   Previously seen in [facility] ED 09/15/2021 with complaints of worsening bilateral LE edema for 1 week. Patient's BNP was only 3 so he did not have any evidence of heart failure, and his creatinine was 2.49 but that was noted to be chronic for him. LE ultrasound imaging was negative for DVT. He reported no improvement of his swelling with 20 mg Lasix. He was discharged with increased Lasix to 40 mg daily for 3 days.   Later seen in the ED 10/03/2021 with abdominal pain and constipation with unclear etiology. He was initially found to be hypotensive. Checked troponin which was normal. EKG initially with poor baseline and likely PACs, rechecked and demonstrated normal sinus rhythm. Baseline creatinine 2.5 increased to 3.9 likely from dehydration. Treated with IV fluids. He was admitted for AKI but there were no beds available. He was instructed to stay hydrated with close follow-up.   Cardiovascular risk factors: Prior clinical ASCVD: None. Comorbid conditions: Hypertension - He has had hypertension for about 10 years. Occasionally he monitors home BP.  Hyperlipidemia - On rosuvastatin. Diabetes - Diagnosed in 2010. Never needed to be on insulin. Chronic kidney disease - Detected via blood panel 5-6 years ago. Previously was on 81 mg aspirin until his kidney disease was diagnosed. Of note, he is also on testosterone cypionate  once weekly.  Metabolic syndrome/Obesity: Highest adult weight about 375 lbs, currently 307 lbs in clinic today. He attributes his weight loss to his age and not eating as much, especially after his accident last August. He had tried Ozempic in the past, but he had started this right after his fall so he didn't tolerate it. At this time, he has not yet started the Trulicity. Chronic inflammatory conditions: None. Tobacco use history:  Never. Family history: His mother was found to have a leaky mitral valve thought to be 2/2 years of consistent hypertension; also diabetes. He complains of feeling a sensation of "fullness" in his legs, which he notes his mother had as well. His paternal grandmother had heart disease later in life. No other known cardiovascular history in his family. Prior cardiac testing and/or incidental findings on other testing (ie coronary calcium): Exercise level:  "Unable to do just about everything." Mostly he feels limited by residual symptoms since his fall in 08/2021 including partial paralysis/weakness. Most of the time his legs don't want to ambulate; he states he is a fall risk. No touch sensation in his hands. If he does anything very strenuous, he may feel as though he will pass out. However, he is able to stop and rest to prevent actual loss of consciousness. He has a consult soon regarding hydrotherapy.  Current diet:  English muffin and sausage for breakfast. Lunch could be leftovers from dinner. Dinner is a standard meal.  At his initial visit, blood pressure was just at goal on valsartan-HCTZ, hydralazine. Was already on rousvastatin given diabetes. Discussed aspirin  which he was amenable to initiating. Continued CPAP.  Today, he states he is feeling good from a cardiovascular perspective. Most intense activity lately has been walking. Physical activity is limited since his prior fall, but notes that he is starting to regain more function. No anginal symptoms.   At this  time he has taken himself off of Trulicity due to severe GI issues. He reports that his blood sugars have been stable. A1C was 6.5% as of 08/2021.  He continues to use his CPAP faithfully.  He denies any palpitations, chest pain, shortness of breath, peripheral edema, lightheadedness, headaches, syncope, orthopnea, or PND.  ROS:  Please see the history of present illness. ROS otherwise negative except as noted.   Studies Reviewed: Marland Kitchen         Physical Exam:    VS:  BP 134/68   Pulse 70   Ht 5\' 11"  (1.803 m)   Wt 286 lb 14.4 oz (130.1 kg)   SpO2 100%   BMI 40.01 kg/m    Wt Readings from Last 3 Encounters:  09/03/22 286 lb 14.4 oz (130.1 kg)  01/21/22 (!) 307 lb 8 oz (139.5 kg)  10/03/21 (!) 317 lb (143.8 kg)    GEN: Well nourished, well developed in no acute distress HEENT: Normal, moist mucous membranes NECK: No JVD CARDIAC: regular rhythm, normal S1 and S2, no rubs or gallops. No murmur. VASCULAR: Radial and DP pulses 2+ bilaterally. No carotid bruits RESPIRATORY:  Clear to auscultation without rales, wheezing or rhonchi  ABDOMEN: Soft, non-tender, non-distended MUSCULOSKELETAL:  Ambulates independently SKIN: Warm and dry, no edema. Left boot in place. NEUROLOGIC:  Alert and oriented x 3. No focal neuro deficits noted. PSYCHIATRIC:  Normal affect   ASSESSMENT AND PLAN: .    Hypertension Stage 4 CKD with proteinuria Type II diabetes OSA on CPAP Hyperlipidemia, mixed Obesity, BMI 40 Consistent with metabolic syndrome -follows with [facility] nephrology, Dr. Janit Pagan -Did not tolerate trulicity (GLP1RA). -discussed SGLT2i today. His last GFR was 26, but his A1c has been very well controlled. Even though glucose lowering effects may be reduced at GFR <30, he can still get cardiac and renal benefits. Would also potentially allow for him to come off of either glipizide or Januvia at his upcoming follow up visit with his PCP -will start Jardiance today (listed as preferred, but Farxiga  acceptable as well). Recheck BMET in 3 weeks. Counseled on avoiding dehydration, watching for UTI. -limited options for antihypertensives given his renal disease. Currently on valsartan-HCTZ, hydralazine. BP just at goal today. -on rousvastatin given diabetes. Discussed aspirin today as well, he is amenable -continue CPAP   Cardiac risk counseling and prevention recommendations: with risk factors above and family history of heart disease -recommend heart healthy/Mediterranean diet, with whole grains, fruits, vegetable, fish, lean meats, nuts, and olive oil. Limit salt. -recommend moderate walking, 3-5 times/week for 30-50 minutes each session. Aim for at least 150 minutes.week. Goal should be pace of 3 miles/hours, or walking 1.5 miles in 30 minutes -recommend avoidance of tobacco products. Avoid excess alcohol. -ASCVD risk score: The ASCVD Risk score (Arnett DK, et al., 2019) failed to calculate for the following reasons:   The valid total cholesterol range is 130 to 320 mg/dL     Dispo: Follow-up in 6 months, or sooner as needed.  I,Mathew Stumpf,acting as a Neurosurgeon for [employer], MD.,have documented all relevant documentation on the behalf of Jodelle Red, MD,as directed by  Jodelle Red, MD while in the presence of  Jodelle Red, MD.  I, Jodelle Red, MD, have reviewed all documentation for this visit. The documentation on 09/03/22 for the exam, diagnosis, procedures, and orders are all accurate and complete.   Signed, Jodelle Red, MD

## 2023-01-06 DIAGNOSIS — G4719 Other hypersomnia: Secondary | ICD-10-CM | POA: Diagnosis not present

## 2023-02-18 DIAGNOSIS — Z7689 Persons encountering health services in other specified circumstances: Secondary | ICD-10-CM | POA: Diagnosis not present

## 2023-02-24 ENCOUNTER — Other Ambulatory Visit: Payer: Self-pay | Admitting: *Deleted

## 2023-02-24 DIAGNOSIS — E118 Type 2 diabetes mellitus with unspecified complications: Secondary | ICD-10-CM

## 2023-02-24 DIAGNOSIS — N184 Chronic kidney disease, stage 4 (severe): Secondary | ICD-10-CM

## 2023-02-24 MED ORDER — EMPAGLIFLOZIN 10 MG PO TABS: 10.0000 mg | ORAL_TABLET | Freq: Every day | ORAL | 1 refills | Status: DC

## 2023-03-04 DIAGNOSIS — E875 Hyperkalemia: Secondary | ICD-10-CM | POA: Diagnosis not present

## 2023-03-04 DIAGNOSIS — E1122 Type 2 diabetes mellitus with diabetic chronic kidney disease: Secondary | ICD-10-CM | POA: Diagnosis not present

## 2023-03-04 DIAGNOSIS — I10 Essential (primary) hypertension: Secondary | ICD-10-CM | POA: Diagnosis not present

## 2023-03-04 DIAGNOSIS — Z0001 Encounter for general adult medical examination with abnormal findings: Secondary | ICD-10-CM | POA: Diagnosis not present

## 2023-03-11 DIAGNOSIS — E875 Hyperkalemia: Secondary | ICD-10-CM | POA: Diagnosis not present

## 2023-03-11 DIAGNOSIS — Z7689 Persons encountering health services in other specified circumstances: Secondary | ICD-10-CM | POA: Diagnosis not present

## 2023-03-11 DIAGNOSIS — E1122 Type 2 diabetes mellitus with diabetic chronic kidney disease: Secondary | ICD-10-CM | POA: Diagnosis not present

## 2023-03-11 DIAGNOSIS — E860 Dehydration: Secondary | ICD-10-CM | POA: Diagnosis not present

## 2023-03-11 DIAGNOSIS — R5383 Other fatigue: Secondary | ICD-10-CM | POA: Diagnosis not present

## 2023-03-14 DIAGNOSIS — Z125 Encounter for screening for malignant neoplasm of prostate: Secondary | ICD-10-CM | POA: Diagnosis not present

## 2023-03-14 DIAGNOSIS — N433 Hydrocele, unspecified: Secondary | ICD-10-CM | POA: Diagnosis not present

## 2023-03-14 DIAGNOSIS — N529 Male erectile dysfunction, unspecified: Secondary | ICD-10-CM | POA: Diagnosis not present

## 2023-03-14 DIAGNOSIS — E291 Testicular hypofunction: Secondary | ICD-10-CM | POA: Diagnosis not present

## 2023-03-14 DIAGNOSIS — N138 Other obstructive and reflux uropathy: Secondary | ICD-10-CM | POA: Diagnosis not present

## 2023-03-14 DIAGNOSIS — N401 Enlarged prostate with lower urinary tract symptoms: Secondary | ICD-10-CM | POA: Diagnosis not present

## 2023-04-04 DIAGNOSIS — L851 Acquired keratosis [keratoderma] palmaris et plantaris: Secondary | ICD-10-CM | POA: Diagnosis not present

## 2023-04-04 DIAGNOSIS — E119 Type 2 diabetes mellitus without complications: Secondary | ICD-10-CM | POA: Diagnosis not present

## 2023-04-04 DIAGNOSIS — M7672 Peroneal tendinitis, left leg: Secondary | ICD-10-CM | POA: Diagnosis not present

## 2023-04-04 DIAGNOSIS — Z7689 Persons encountering health services in other specified circumstances: Secondary | ICD-10-CM | POA: Diagnosis not present

## 2023-04-04 DIAGNOSIS — L84 Corns and callosities: Secondary | ICD-10-CM | POA: Diagnosis not present

## 2023-05-16 DIAGNOSIS — M4802 Spinal stenosis, cervical region: Secondary | ICD-10-CM | POA: Diagnosis not present

## 2023-05-16 DIAGNOSIS — Z1330 Encounter for screening examination for mental health and behavioral disorders, unspecified: Secondary | ICD-10-CM | POA: Diagnosis not present

## 2023-05-16 DIAGNOSIS — Z7689 Persons encountering health services in other specified circumstances: Secondary | ICD-10-CM | POA: Diagnosis not present

## 2023-05-16 DIAGNOSIS — G894 Chronic pain syndrome: Secondary | ICD-10-CM | POA: Diagnosis not present

## 2023-06-09 DIAGNOSIS — Z7689 Persons encountering health services in other specified circumstances: Secondary | ICD-10-CM | POA: Diagnosis not present

## 2023-06-29 DIAGNOSIS — E875 Hyperkalemia: Secondary | ICD-10-CM | POA: Diagnosis not present

## 2023-06-29 DIAGNOSIS — E669 Obesity, unspecified: Secondary | ICD-10-CM | POA: Diagnosis not present

## 2023-06-29 DIAGNOSIS — N184 Chronic kidney disease, stage 4 (severe): Secondary | ICD-10-CM | POA: Diagnosis not present

## 2023-06-29 DIAGNOSIS — E1122 Type 2 diabetes mellitus with diabetic chronic kidney disease: Secondary | ICD-10-CM | POA: Diagnosis not present

## 2023-06-29 DIAGNOSIS — I129 Hypertensive chronic kidney disease with stage 1 through stage 4 chronic kidney disease, or unspecified chronic kidney disease: Secondary | ICD-10-CM | POA: Diagnosis not present

## 2023-07-11 DIAGNOSIS — M4802 Spinal stenosis, cervical region: Secondary | ICD-10-CM | POA: Diagnosis not present

## 2023-07-11 DIAGNOSIS — S92402A Displaced unspecified fracture of left great toe, initial encounter for closed fracture: Secondary | ICD-10-CM | POA: Diagnosis not present

## 2023-07-11 DIAGNOSIS — G894 Chronic pain syndrome: Secondary | ICD-10-CM | POA: Diagnosis not present

## 2023-07-11 DIAGNOSIS — M2022 Hallux rigidus, left foot: Secondary | ICD-10-CM | POA: Diagnosis not present

## 2023-07-11 DIAGNOSIS — S92422A Displaced fracture of distal phalanx of left great toe, initial encounter for closed fracture: Secondary | ICD-10-CM | POA: Diagnosis not present

## 2023-07-13 ENCOUNTER — Ambulatory Visit (HOSPITAL_BASED_OUTPATIENT_CLINIC_OR_DEPARTMENT_OTHER): Payer: Medicare (Managed Care)

## 2023-07-13 ENCOUNTER — Encounter (HOSPITAL_BASED_OUTPATIENT_CLINIC_OR_DEPARTMENT_OTHER): Payer: Self-pay | Admitting: Student

## 2023-07-13 ENCOUNTER — Ambulatory Visit (HOSPITAL_BASED_OUTPATIENT_CLINIC_OR_DEPARTMENT_OTHER): Payer: Medicare (Managed Care) | Admitting: Student

## 2023-07-13 DIAGNOSIS — M79675 Pain in left toe(s): Secondary | ICD-10-CM | POA: Diagnosis not present

## 2023-07-13 DIAGNOSIS — S99202A Unspecified physeal fracture of phalanx of left toe, initial encounter for closed fracture: Secondary | ICD-10-CM

## 2023-07-13 NOTE — Progress Notes (Signed)
 Chief Complaint: Left foot injury     History of Present Illness:    Peter Bryan is a 63 y.o. male who presents today for evaluation of an injury to his left foot, particularly great toe.  He reports that 4 days ago he had a kitchen stand mixer fall directly onto his big toe.  He was initially seen by spine and scoliosis 2 days ago for x-rays which demonstrated a great toe avulsion fracture.  He has been utilizing a short walking boot and is tolerating some weightbearing.  Overall his pain remains mild although he still does note some slightly decreased sensation within the great toe.  Denies any previous injury to the foot or toe.   Surgical History:   None of left foot  PMH/PSH/Family History/Social History/Meds/Allergies:    Past Medical History:  Diagnosis Date   Chronic kidney disease    Diabetes mellitus without complication (HCC)    Hypertension    Neuromuscular disorder (HCC)    neuropathy feet   Obesity    Sleep apnea    Past Surgical History:  Procedure Laterality Date   BACK SURGERY  1/92-08/2016   multiple levels. C5-S1   CARPAL TUNNEL RELEASE     CERVICAL SPINE SURGERY     ROTATOR CUFF REPAIR     SHOULDER ARTHROSCOPY WITH OPEN ROTATOR CUFF REPAIR AND DISTAL CLAVICLE ACROMINECTOMY Right 03/16/2019   Procedure: SHOULDER ARTHROSCOPY WITH OPEN ROTATOR CUFF REPAIR AND DISTAL CLAVICLE ACROMINECTOMY;  Surgeon: Shari Sieving, MD;  Location: [facility];  Service: Orthopedics;  Laterality: Right;   Social History   Socioeconomic History   Marital status: Married    Spouse name: Not on file   Number of children: Not on file   Years of education: Not on file   Highest education level: Not on file  Occupational History   Not on file  Tobacco Use   Smoking status: Never   Smokeless tobacco: Never  Vaping Use   Vaping status: Never Used  Substance and Sexual Activity   Alcohol use: Never   Drug use: Never   Sexual activity: Not on  file  Other Topics Concern   Not on file  Social History Narrative   Not on file   Social Drivers of Health   Financial Resource Strain: Low Risk  (05/16/2023)   Received [facility]   Overall Financial Resource Strain (CARDIA)    Difficulty of Paying Living Expenses: Not hard at all  Food Insecurity: No Food Insecurity (05/16/2023)   Received from [facility]   Hunger Vital Sign    Within the past 12 months, you worried that your food would run out before you got the money to buy more.: Never true    Within the past 12 months, the food you bought just didn't last and you didn't have money to get more.: Never true  Transportation Needs: No Transportation Needs (05/16/2023)   Received from [facility] - Transportation    Lack of Transportation (Medical): No    Lack of Transportation (Non-Medical): No  Physical Activity: Unknown (03/04/2022)   Received from [facility]   Exercise Vital Sign    On average, how many days per week do you engage in moderate to strenuous exercise (like a brisk walk)?: 0 days  Minutes of Exercise per Session: Not on file  Stress: No Stress Concern Present (03/04/2022)   Received from [facility] - Occupational Stress Questionnaire    Feeling of Stress : Not at all  Social Connections: Somewhat Isolated (03/04/2022)   Received from [facility]   Social Network    How would you rate your social network (family, work, friends)?: Restricted participation with some degree of social isolation   History reviewed. No pertinent family history. No Known Allergies Current Outpatient Medications  Medication Sig Dispense Refill   aspirin  EC 81 MG tablet Take 1 tablet (81 mg total) by mouth daily. Swallow whole. 90 tablet 3   baclofen (LIORESAL) 10 MG tablet Take 10 mg by mouth daily as needed for muscle spasms.     empagliflozin  (JARDIANCE ) 10 MG TABS tablet Take 1 tablet (10 mg total) by mouth  daily before breakfast. 90 tablet 1   gabapentin (NEURONTIN) 300 MG capsule Take 300 mg by mouth 4 (four) times daily.     glipiZIDE (GLUCOTROL XL) 2.5 MG 24 hr tablet Take 2.5 mg by mouth daily with breakfast.     hydrALAZINE (APRESOLINE) 25 MG tablet Take 25 mg by mouth 2 (two) times daily.     HYDROcodone -acetaminophen  (NORCO) 10-325 MG per tablet Take 2-2.5 tablets by mouth See admin instructions. Take 2.5 mg in the morning and at lunch as 2 tables in the evening     JANUVIA 50 MG tablet Take 1 tablet by mouth daily.     LOKELMA  10 g PACK packet Take 10 g by mouth 3 (three) times a week.     magnesium oxide (MAG-OX) 400 MG tablet Take 400 mg by mouth daily.     Peppermint Oil (IBGARD) 90 MG CPCR Take by mouth.     rosuvastatin (CRESTOR) 5 MG tablet Take 5 mg by mouth daily.       sodium bicarbonate 650 MG tablet Take 650 mg by mouth daily.     testosterone  cypionate (DEPOTESTOSTERONE CYPIONATE) 200 MG/ML injection Inject 200 mg into the muscle once a week.     thiamine 50 MG tablet Take 50 mg by mouth 2 (two) times daily.     TRULICITY 0.75 MG/0.5ML SOPN Inject 0.75 mg into the skin. (Patient not taking: Reported on 09/03/2022)     Turmeric (QC TUMERIC COMPLEX PO) Take 4,500 mg by mouth daily at 12 noon.     valsartan-hydrochlorothiazide (DIOVAN-HCT) 320-25 MG tablet Take 1 tablet by mouth at bedtime. Midnight     Vitamin D, Ergocalciferol, (DRISDOL) 1.25 MG (50000 UNIT) CAPS capsule Take 50,000 Units by mouth 2 (two) times a week.     zolpidem  (AMBIEN ) 10 MG tablet Take 10 mg by mouth at bedtime.      No current facility-administered medications for this visit.   No results found.  Review of Systems:   A ROS was performed including pertinent positives and negatives as documented in the HPI.  Physical Exam :   Constitutional: NAD and appears stated age Neurological: Alert and oriented Psych: Appropriate affect and cooperative There were no vitals taken for this visit.    Comprehensive Musculoskeletal Exam:    Exam of the left foot demonstrates moderate swelling and ecchymosis of the great toe, with ecchymosis extending into the 2nd and 3rd digits.  Range of motion is limited at the IP joint due to pain and stiffness.  Distal sensation to the great toe slightly diminished.  Brisk capillary  refill to all 5 digits.  No tenderness within the 2nd through 5th toes.  Dorsalis pedis pulse 2+.  Imaging:   Xray (left great toe 3 views): Mildly displaced fracture of the proximal aspect of the distal phalanx extending into the IP joint.  Moderate first MCP joint osteoarthritis.   I personally reviewed and interpreted the radiographs.   Assessment:   63 y.o. male with an acute intra-articular fracture of the great toe distal phalanx into the IP joint after he dropped a heavy object on his foot 4 days ago.  Patient does have a moderate amount of swelling and ecchymosis within the toe, although overall his pain levels are well-controlled.  Although the fracture does extend into the joint, it appears mildly displaced and therefore believe he is a good candidate for nonoperative management.  Will transition him from a walking boot into a surgical shoe, which I believe he will tolerate better particularly given his pre-existing balance issues as a result of multiple spinal issues and procedures.  Will plan to repeat x-rays in another 3 weeks to ensure no further displacement and monitor healing.  Plan :    - Return to clinic in 3 weeks for reassessment and repeat x-rays     I personally saw and evaluated the patient, and participated in the management and treatment plan.  Leonce Reveal, PA-C Orthopedics

## 2023-07-22 ENCOUNTER — Encounter: Payer: Self-pay | Admitting: Advanced Practice Midwife

## 2023-07-29 ENCOUNTER — Other Ambulatory Visit: Payer: Self-pay | Admitting: Cardiology

## 2023-07-29 DIAGNOSIS — E118 Type 2 diabetes mellitus with unspecified complications: Secondary | ICD-10-CM

## 2023-07-29 DIAGNOSIS — N184 Chronic kidney disease, stage 4 (severe): Secondary | ICD-10-CM

## 2023-08-15 DIAGNOSIS — M4802 Spinal stenosis, cervical region: Secondary | ICD-10-CM | POA: Diagnosis not present

## 2023-08-15 DIAGNOSIS — G894 Chronic pain syndrome: Secondary | ICD-10-CM | POA: Diagnosis not present

## 2023-08-16 DIAGNOSIS — K5903 Drug induced constipation: Secondary | ICD-10-CM | POA: Diagnosis not present

## 2023-08-16 DIAGNOSIS — R140 Abdominal distension (gaseous): Secondary | ICD-10-CM | POA: Diagnosis not present

## 2023-08-16 DIAGNOSIS — G894 Chronic pain syndrome: Secondary | ICD-10-CM | POA: Diagnosis not present

## 2023-08-16 DIAGNOSIS — K5909 Other constipation: Secondary | ICD-10-CM | POA: Diagnosis not present

## 2023-08-29 DIAGNOSIS — E875 Hyperkalemia: Secondary | ICD-10-CM | POA: Diagnosis not present

## 2023-08-29 DIAGNOSIS — R5383 Other fatigue: Secondary | ICD-10-CM | POA: Diagnosis not present

## 2023-08-29 DIAGNOSIS — I10 Essential (primary) hypertension: Secondary | ICD-10-CM | POA: Diagnosis not present

## 2023-08-29 DIAGNOSIS — E1122 Type 2 diabetes mellitus with diabetic chronic kidney disease: Secondary | ICD-10-CM | POA: Diagnosis not present

## 2023-09-06 DIAGNOSIS — M542 Cervicalgia: Secondary | ICD-10-CM | POA: Diagnosis not present

## 2023-09-06 DIAGNOSIS — R5383 Other fatigue: Secondary | ICD-10-CM | POA: Diagnosis not present

## 2023-09-06 DIAGNOSIS — E860 Dehydration: Secondary | ICD-10-CM | POA: Diagnosis not present

## 2023-09-06 DIAGNOSIS — E1122 Type 2 diabetes mellitus with diabetic chronic kidney disease: Secondary | ICD-10-CM | POA: Diagnosis not present

## 2023-09-06 DIAGNOSIS — E1121 Type 2 diabetes mellitus with diabetic nephropathy: Secondary | ICD-10-CM | POA: Diagnosis not present

## 2023-09-06 DIAGNOSIS — E875 Hyperkalemia: Secondary | ICD-10-CM | POA: Diagnosis not present

## 2023-09-29 DIAGNOSIS — K5904 Chronic idiopathic constipation: Secondary | ICD-10-CM | POA: Diagnosis not present

## 2023-10-03 DIAGNOSIS — G894 Chronic pain syndrome: Secondary | ICD-10-CM | POA: Diagnosis not present

## 2023-10-03 DIAGNOSIS — I129 Hypertensive chronic kidney disease with stage 1 through stage 4 chronic kidney disease, or unspecified chronic kidney disease: Secondary | ICD-10-CM | POA: Diagnosis not present

## 2023-10-03 DIAGNOSIS — G4733 Obstructive sleep apnea (adult) (pediatric): Secondary | ICD-10-CM | POA: Diagnosis not present

## 2023-10-03 DIAGNOSIS — Z6841 Body Mass Index (BMI) 40.0 and over, adult: Secondary | ICD-10-CM | POA: Diagnosis not present

## 2023-10-03 DIAGNOSIS — N184 Chronic kidney disease, stage 4 (severe): Secondary | ICD-10-CM | POA: Diagnosis not present

## 2023-10-03 DIAGNOSIS — M4802 Spinal stenosis, cervical region: Secondary | ICD-10-CM | POA: Diagnosis not present

## 2023-10-03 IMAGING — MR MR LUMBAR SPINE WO/W CM
7 series · 35 of 48 positions shown · IV contrast (MH)
Comparison: MRI thoracic spine 11/15/2020

CLINICAL DATA: Mid and low back pain.

EXAM:
MRI THORACIC AND LUMBAR SPINE WITHOUT AND WITH CONTRAST
TECHNIQUE: Multiplanar and multiecho pulse sequences of the thoracic and lumbar
spine were obtained without and with intravenous contrast.
CONTRAST:  10mL GADAVIST GADOBUTROL 1 MMOL/ML IV SOLN

[Series 1: t2_tse_warp_sag · sagittal · 4.0mm · 0.81mm/px · 4 of 17 slices shown]
[im 1/17]
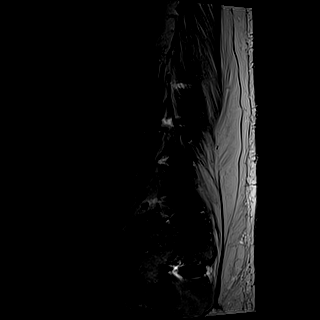
[im 6/17]
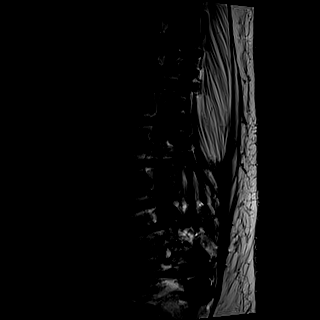
[im 11/17]
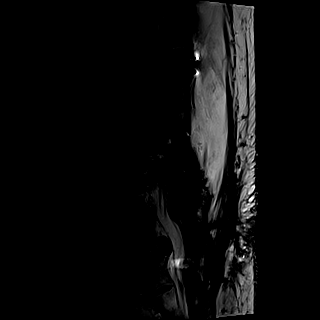
[im 17/17]
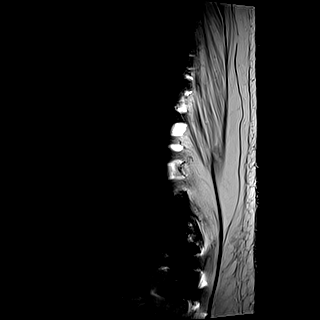

[Series 2: t2_tse_stir_warp_sag · sagittal · 4.0mm · 1.02mm/px · 4 of 17 slices shown]
[im 1/17]
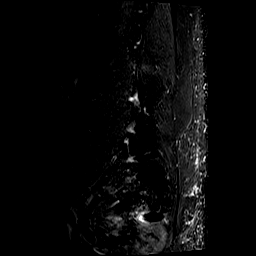
[im 6/17]
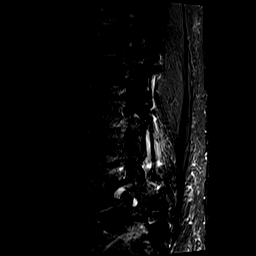
[im 11/17]
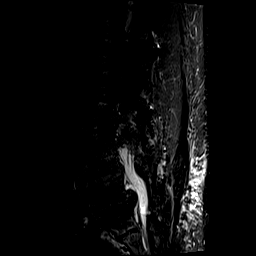
[im 17/17]
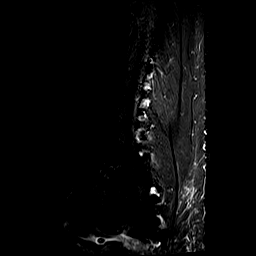

[Series 3: t1_tse_warp_sag · sagittal · 4.0mm · 0.81mm/px · 4 of 17 slices shown]
[im 1/17]
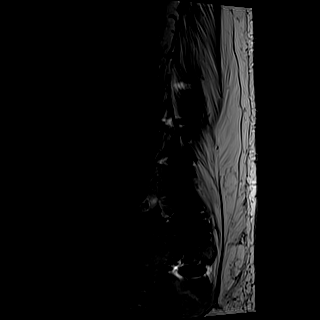
[im 6/17]
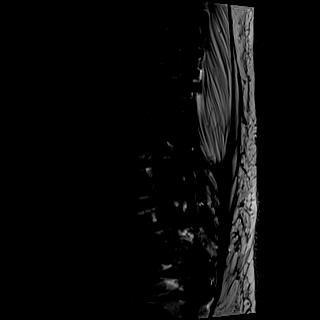
[im 11/17]
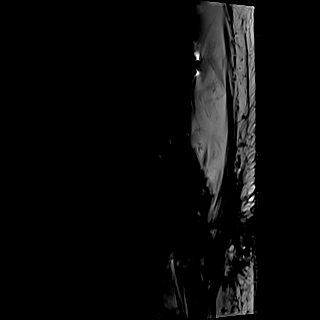
[im 17/17]
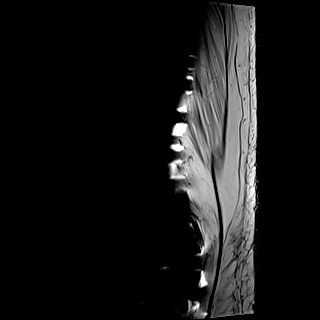

[Series 4: t2_tse_warp_tra · axial · 4.0mm · 0.78mm/px · z∈[-489,-248]mm · 11 of 56 slices shown]
[im 1/56]
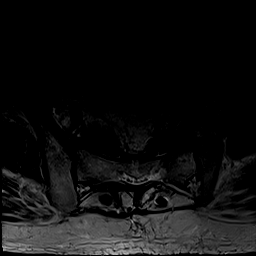
[im 6/56]
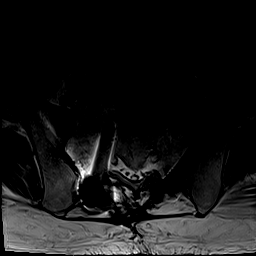
[im 12/56]
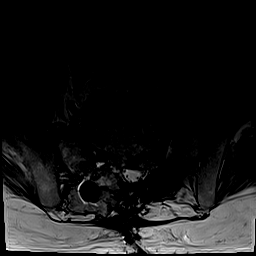
[im 17/56]
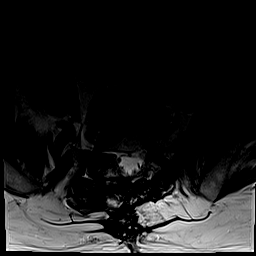
[im 23/56]
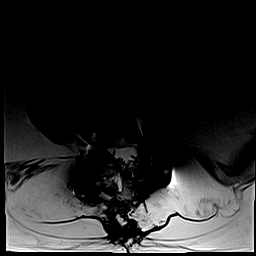
[im 28/56]
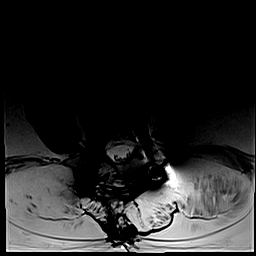
[im 34/56]
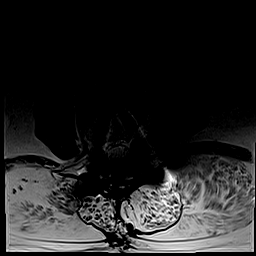
[im 39/56]
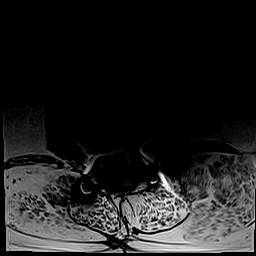
[im 45/56]
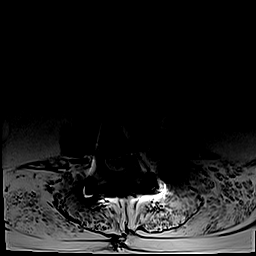
[im 50/56]
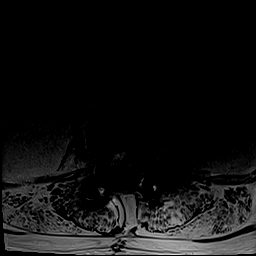
[im 56/56]
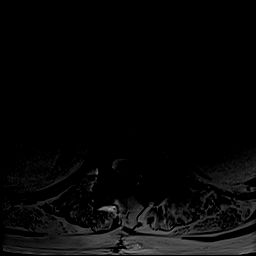

[Series 5: t1_tse_warp_tra · axial · 4.0mm · 0.39mm/px · z∈[-489,-248]mm · 8 of 56 slices shown]
[im 1/56]
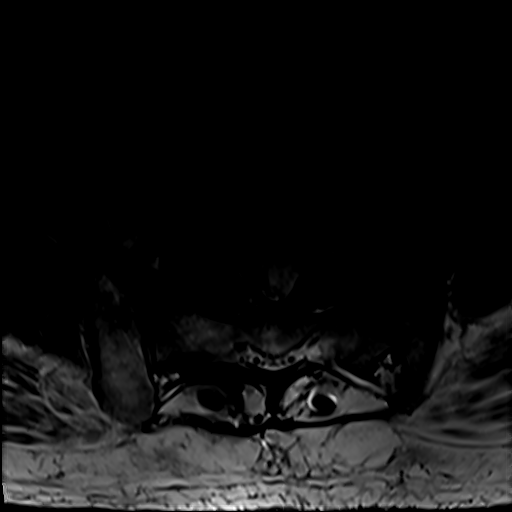
[im 12/56]
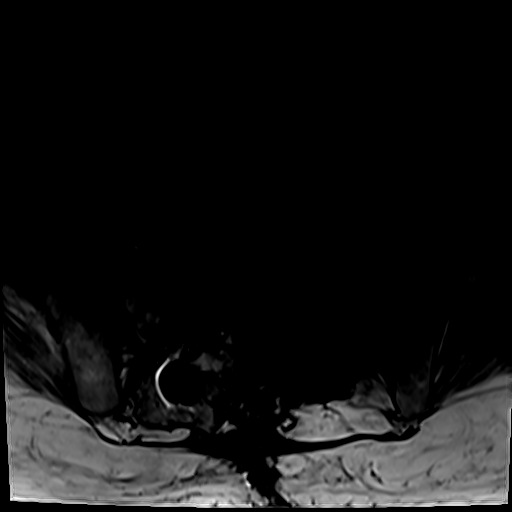
[im 17/56]
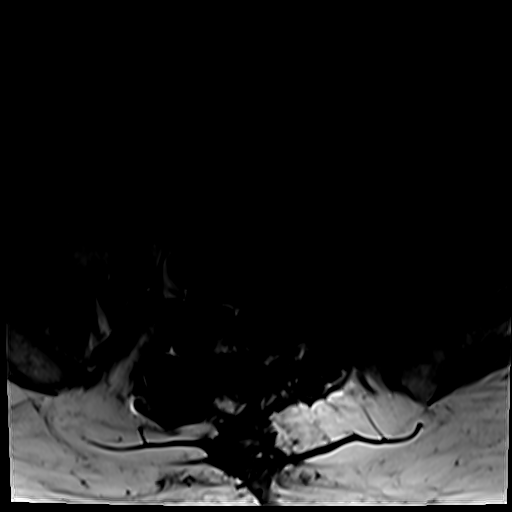
[im 23/56]
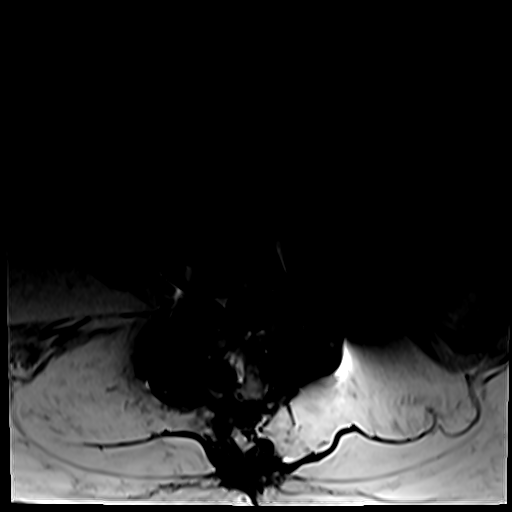
[im 34/56]
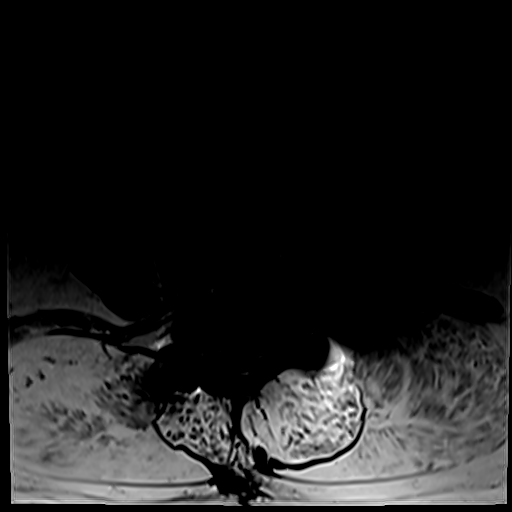
[im 39/56]
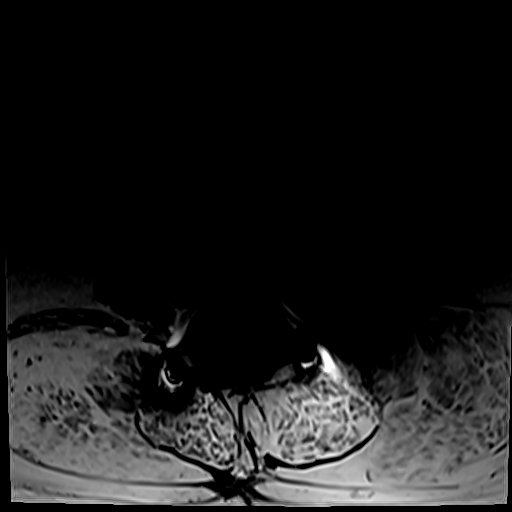
[im 45/56]
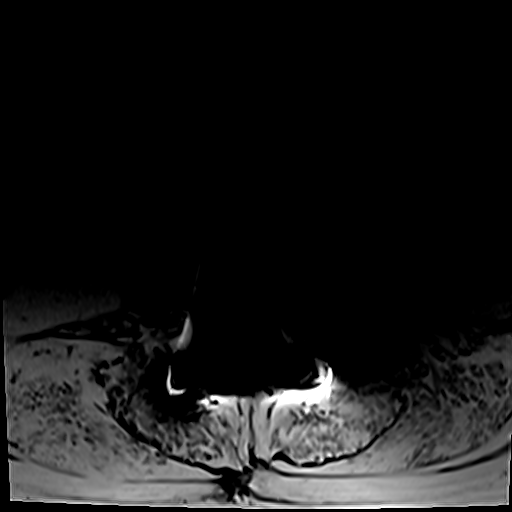
[im 56/56]
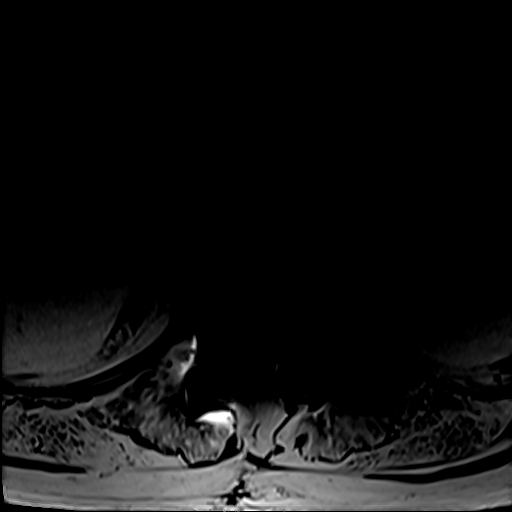

[Series 6: t1_tse_warp_sag post · sagittal · 4.0mm · 0.81mm/px · 3 of 17 slices shown]
[im 1/17]
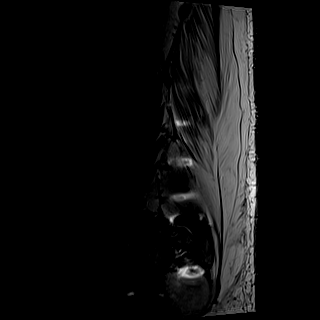
[im 9/17]
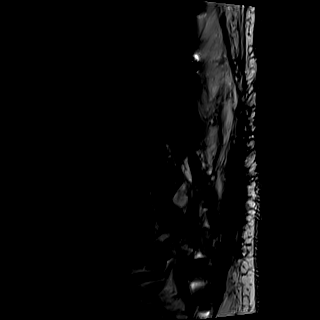
[im 17/17]
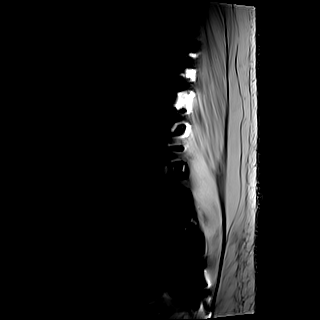

[Series 7: t1_tse_warp_tra post · axial · 4.0mm · 0.39mm/px · 1 of 56 slices shown]
[im 1/56]
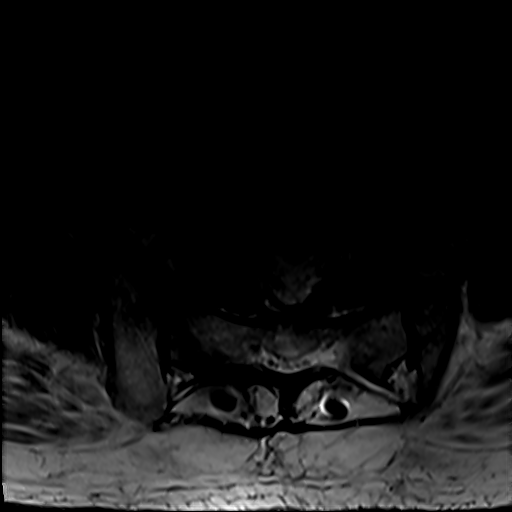

[35 of 48 positions shown; findings below may reference images not displayed]

FINDINGS: MRI THORACIC SPINE FINDINGS

Alignment:  Normal overall alignment.

Vertebrae: Normal marrow signal. No bone lesions or fractures.
Endplate reactive changes are stable. Moderate artifact associated
with the lower thoracic spine fusion hardware beginning at T10 and
extending down to the lumbar spine.

Cord: Stable cord compression at T7-8 with cord atrophy and
myelomalacia.

Paraspinal and other soft tissues: No significant paraspinal or
retroperitoneal findings.

Disc levels:

Stable left-sided disc osteophyte complexes at T2-3, T3-4 and T4-5.
Mild mass effect on the left side of the thecal sac at these levels.

Stable bulging degenerated annulus osteophytic ridging and
ligamentum flavum thickening and facet disease at T7-8 contributing
to spinal stenosis and changes of myelomalacia involving the
thoracic spinal cord.

MRI LUMBAR SPINE FINDINGS

Segmentation: There are five lumbar type vertebral bodies. The last
full intervertebral disc space is labeled L5-S1.

Alignment:  Normal overall alignment.

Vertebrae: Extensive fusion hardware extending from T10-S1
incomplete interbody fusion changes at L3-4 and L5-S1. No acute bony
findings or destructive bony changes. No areas of abnormal contrast
enhancement are identified to suggest discitis or osteomyelitis or
epidural abscess.

Conus medullaris: Extends to the bottom of T12 level and appears
normal.

Paraspinal and other soft tissues: No significant findings.

Disc levels:

Wide decompressive laminectomies with posterior and interbody fusion
hardware.

Osteophytic ridging at T12-L1 with impression on the ventral thecal
sac which appears stable. L1-2, L2-3, L3-4 and L4-5 do not
demonstrate any significant spinal or foraminal stenosis.

L5-S1: Marked osteophytic spurring posteriorly with flattening of
the ventral thecal sac and significant appearing bilateral foraminal
stenosis.
IMPRESSION: 1. Extensive fusion hardware extending from T10-S1. No acute bony
findings or evidence of discitis or osteomyelitis or epidural
abscess.
2. Stable left-sided disc osteophyte complexes at T2-3, T3-4 and
T4-5.
3. Stable spinal stenosis at T7-8 with chronic cord atrophy and
myelomalacia.
4. Stable osteophytic spurring posteriorly at T12-L1 with impression
on the ventral thecal sac.
5. Moderate spinal stenosis and significant appearing bilateral
foraminal stenosis at L5-S1.

## 2023-10-04 DIAGNOSIS — E860 Dehydration: Secondary | ICD-10-CM | POA: Diagnosis not present

## 2023-10-04 DIAGNOSIS — E875 Hyperkalemia: Secondary | ICD-10-CM | POA: Diagnosis not present

## 2023-10-10 DIAGNOSIS — E291 Testicular hypofunction: Secondary | ICD-10-CM | POA: Diagnosis not present

## 2023-10-10 DIAGNOSIS — N401 Enlarged prostate with lower urinary tract symptoms: Secondary | ICD-10-CM | POA: Diagnosis not present

## 2023-10-10 DIAGNOSIS — N529 Male erectile dysfunction, unspecified: Secondary | ICD-10-CM | POA: Diagnosis not present

## 2023-10-10 DIAGNOSIS — Z125 Encounter for screening for malignant neoplasm of prostate: Secondary | ICD-10-CM | POA: Diagnosis not present

## 2023-10-10 DIAGNOSIS — N433 Hydrocele, unspecified: Secondary | ICD-10-CM | POA: Diagnosis not present

## 2023-10-10 DIAGNOSIS — N138 Other obstructive and reflux uropathy: Secondary | ICD-10-CM | POA: Diagnosis not present

## 2023-10-10 NOTE — Progress Notes (Signed)
 Peter Bryan                                          MRN: [id number]   10/10/2023   The [facility] Quality Team Specialist reviewed this patient medical record for the purposes of chart review for care gap closure. The following were reviewed: chart review for care gap closure-kidney health evaluation for diabetes:eGFR  and uACR.    [facility] Quality Team

## 2023-10-18 DIAGNOSIS — N184 Chronic kidney disease, stage 4 (severe): Secondary | ICD-10-CM | POA: Diagnosis not present

## 2023-10-21 DIAGNOSIS — E669 Obesity, unspecified: Secondary | ICD-10-CM | POA: Diagnosis not present

## 2023-10-21 DIAGNOSIS — N184 Chronic kidney disease, stage 4 (severe): Secondary | ICD-10-CM | POA: Diagnosis not present

## 2023-10-21 DIAGNOSIS — I129 Hypertensive chronic kidney disease with stage 1 through stage 4 chronic kidney disease, or unspecified chronic kidney disease: Secondary | ICD-10-CM | POA: Diagnosis not present

## 2023-10-21 DIAGNOSIS — E875 Hyperkalemia: Secondary | ICD-10-CM | POA: Diagnosis not present

## 2023-10-21 DIAGNOSIS — E1122 Type 2 diabetes mellitus with diabetic chronic kidney disease: Secondary | ICD-10-CM | POA: Diagnosis not present

## 2023-11-07 ENCOUNTER — Encounter: Payer: Self-pay | Admitting: Radiology

## 2023-11-28 NOTE — Progress Notes (Signed)
 "  [facility]- [facility] Outpatient Visit   Peter Bryan  DOB: [birth year]  MRN: [id number] 11/28/2023    Subjective:    Peter Bryan is a 63 y.o. (DOB [birth year]) male.     Patient presents with   Spine - Follow-up   Medication Management     HPI Patient is s/p Revision T7-L1 lami, PSF on 03/02/21 with Dr. Gust. H/o multiple TL surgeries, previously fused T10-pelvis. Multiple cervical surgeries, last one was revision ACDF C3-5 on 03/22/16 with Dr. Gust. Currently fused C3-T1 (C3-6 anterior, C5-T1 posterior). Pt reports relief of thoracic radicular pain and reports some improvement with balance. He continues to use his Umholtz for stability due to his chronic weak LLE dorsiflexion, left foot drop.   He presents today for med check. Continues taking norco, ambien  , gabapentin.  Patient completed aquatic therapy at [facility] and reports it providing significant benefit. Ambulates with a Nuno and not an electric wheelchair anymore. He is able to write again, at least legibly whereas before he couldn't. He can shower on his own again.  History of fall sustained in 08/2021, where he lost sensation to BUE and BLE. He was  evaluated at [facility] in [place] for surgical decompression to address the cervical myeloradiculopathy and prevent further nerve damage for his cervical spine. However, he decided not to proceed with surgery after his eval due to being provided with a very bleak prognosis.   PMH DM-II and Gout.   Pt is taking Norco 10 3-4/day, baclofen 10mg  BID prn, Ambien  10-20mg  QHS, gabapentin 600 QID. All rx'd by us . Last UDT 07/2022 consistent.   IMAGING Left forefoot XR obtained in clinic  07/11/23 and reviewed with patient showing lateral avulsion fracture of the distal phalanx of his left hallux. Also noted in advanced osteoarthritis in MTP of the hallux.   Thoracic XR 03/30/21 showing TL fusion with  hardware intact and well positioned, no loosening or complications. Thoracic XR 06/09/21 showing TL PSF w/o acute complications.  Scoli XR 04/27/21 showing T7-pelvis PSF with known broken rod on the right side between L5-S1. no acute complications noted.   Cervical MRI 06/29/21 on canopy showing C1-2: Advanced atlantodental degeneration with ligamentous thickening. Small C1 ring with C1-2 anterolisthesis which causes spinal stenosis flattening the cord. C2-3: Disc narrowing and bulging with central protrusion. Ligamentum flavum thickening. There is degenerative facet spurring on the left more than right. Spinal stenosis flattening the cord. Left foraminal impingement. C3-T1 fused. T1-2 disc bulging and facet spurring with moderate bilateral foraminal narrowing and mild spinal stenosis.    Reviewed and updated this visit by provider: Tobacco  Allergies  Meds  Problems  Med Hx  Surg Hx  Fam Hx  PDMP       Review of Systems  Constitutional:  Negative for activity change, appetite change, chills, diaphoresis, fatigue, fever and unexpected weight change.  HENT: Negative.    Eyes: Negative.   Respiratory: Negative.  Negative for shortness of breath.   Cardiovascular:  Negative for chest pain, palpitations and leg swelling.  Gastrointestinal: Negative.   Endocrine: Negative.   Genitourinary: Negative.   Musculoskeletal:  Negative for arthralgias, back pain, gait problem, joint swelling, myalgias and neck pain.  Skin:  Negative for color change and wound.  Allergic/Immunologic: Negative.   Neurological:  Negative for weakness, numbness (bilateral hands) and headaches.  Hematological: Negative.   Psychiatric/Behavioral: Negative.  Negative for hallucinations, self-injury and sleep disturbance.  Objective:   Vitals:   11/28/23 1013  Height: 5' 11 (1.803 m)  Weight: 284 lb (128.8 kg)  BMI (Calculated): 39.6  PainSc:   3  PainLoc: Back    Constitutional: He appears healthy. No  distress.  Musculoskeletal:        General: No tenderness or edema.     Cervical back: Normal range of motion.     Lumbar back: Negative right straight leg raise test and negative left straight leg raise test.  Neurological: He is alert and oriented to person, place, and time.  Skin: Skin is warm and dry.  Back Exam   Tenderness  The patient is experiencing no tenderness.   Range of Motion  Extension:  normal  Flexion:  normal  Lateral bend right:  normal  Lateral bend left:  normal  Rotation right:  normal  Rotation left:  normal   Muscle Strength  The patient has normal back strength.  Tests  Straight leg raise right: negative Straight leg raise left: negative  Reflexes  Patellar:  normal Achilles:  normal Biceps:  normal Babinski's sign: normal   Other  Toe walk: normal Heel walk: normal Sensation: normal Gait: normal (Ambulating with a rollator)  Erythema: no back redness Scars: absent  Comments:  Hand numbness with cervical spine flexion  -Ambulating with rollator and weight bearing.            Assessment / Plan:   ASSESSMENT 1. Spinal stenosis, cervical region (Primary) 2. Chronic pain syndrome -     zolpidem  tartrate (AMBIEN ) 10 mg tablet; Take one tablet to one and a half tablets (10-15 mg dose) by mouth at bedtime for 30 days. Max Daily Amount: 15 mg, Starting Mon 11/28/2023, Until Wed 12/28/2023, Normal -     HYDROcodone -acetaminophen  (NORCO) 10-325 mg per tablet; Take one tablet by mouth every 6 (six) hours as needed for Pain for up to 30 days. Max Daily Amount: 4 tablets, Starting Fri 12/02/2023, Until Sun 01/01/2024 at 2359, Normal   PLAN Plan to continue managing his chronic cervical spine stenosis with medications. Continue ambien  nightly, 1-2 at bedtime as needed and continue gabapentin 600mg  QID. Refilled ambien  today.  Reviewed patient medications today. Continued prescribing of current medications is medically necessary to allow  patient to maintain activity and quality of life. They take medications as prescribed. Medications improve their quality of life and level of function. Patient does not display aberrant behavior. Upon discussion today, patient understands opioid therapy is optional and feels pain has been refractory to reasonable conservative measures and is significant and affecting quality of life enough to warrant ongoing therapy and wishes to continue opioids. ORT reviewed, score 0 with low risk. PDMP reviewed and appropriate. Discussed medications and prescribing with Dr. Gust who agrees with plan. Sent norco 10 #100 today. This should allow him to take 3 per day and on days where he needs an extra one, he will have 10 tablets to do that with.   He is seeing Dr. Tobie for ongoing right OA hip pain, knee pain and L ankle pain. Questions were encouraged and answered. Patient was instructed to call with any new concerns or problems. F/u in 8 wks for med check.  Risks, benefits, and alternatives of the medications and treatment plan prescribed today were discussed, and patient expressed understanding. Plan follow-up as discussed or as needed if any worsening symptoms or change in condition.    Documentation for time-based billing:  Total time spent of date of  service was 30 minutes.  Patient care activities included preparing to see the patient such as reviewing the patient record, counseling and educating the patient, family, and/or caregiver, ordering prescription medications, tests, or procedures, and documenting clinical information in the electronic or other health record.        "

## 2023-12-09 NOTE — Telephone Encounter (Signed)
 [facility]  Arland Beverley Hamel, CMA; Marzurika Massenburg; Sonny Ee Good morning Patient did not want to move forward and wanted order cancelled 10/17/23.       Previous Messages    ----- Message ----- From: Arland Beverley Hamel, CMA Sent: 12/09/2023   8:16 AM EST To: Caroll Norrie  Was he setup ----- Message ----- From: Caroll Norrie Sent: 10/03/2023   4:54 PM EST To: Arland Beverley Hamel, CMA; Kari Kitty; Rebecc*  Hello  Order has been pulled will have processed.  Thank you!

## 2024-01-04 NOTE — Progress Notes (Signed)
"                                                          ° ° ° °  Chief Complaint: Follow-up constipation  HPI: Peter Bryan is a 63 year old male who returns for follow-up of constipation.  I last saw him in September.  At that time with Linzess 290 mcg daily he was having a bowel movement every day but still felt like he had trouble completely emptying and is worried that he had more retained stool in his small bowel.  I recommended the addition of a fiber supplement as well as MiraLAX  combined with the Linzess.  Today he tells me that he takes Linzess 290 mcg daily and he is using Veltassa for hyperkalemia and he feels like this has helped his bowel regularity a lot.  He has 2 bowel movements every 3 days but usually every day. He has no blood in the stool and no straining with bowel movements.    Past Medical History:  Diagnosis Date   Diabetes mellitus (*)    Gout    Hyperlipidemia    Hypertension    Low testosterone     Sleep apnea    Varicose veins of leg with swelling, bilateral     Review of Systems:  a 14 point review of systems is complete and negative except as per HPI  Physical Exam:  BP (!) 117/57 (BP Location: Right Upper Arm, Patient Position: Sitting)   Pulse 99   Temp 98.3 F (36.8 C) (Temporal)   Ht 5' 11 (1.803 m)   Wt 280 lb 9.6 oz (127.3 kg)   BMI 39.14 kg/m  Gen:  Alert and oriented, no acute distress HEENT:  Anicteric sclera, EOMI, OP clear Chest:  CTA bilaterally CV:  Normal S1, S2, regular rhythm, normal rate, no murmurs, rubs, or gallops Abd:  Good audible bowel sounds, soft, non-tender, non-distended, no masses.  No hepatosplenomegaly. Ext:  No edema.    Impression/Plan:    1. Constipation, unspecified constipation type         Discussion and Summary:    Peter Bryan is a 63 year old who returns for follow-up of constipation.  The addition of Veltassa to his Linzess 290 mcg daily has proved effective in managing his constipation and he is feeling improved.   I am glad to see him doing well.  I will make no changes to this regimen today.  Will see him back in about 6 months.     Peter Messing, MD "

## 2024-01-10 ENCOUNTER — Inpatient Hospital Stay (HOSPITAL_COMMUNITY)

## 2024-01-10 ENCOUNTER — Emergency Department (HOSPITAL_BASED_OUTPATIENT_CLINIC_OR_DEPARTMENT_OTHER)

## 2024-01-10 ENCOUNTER — Inpatient Hospital Stay (HOSPITAL_BASED_OUTPATIENT_CLINIC_OR_DEPARTMENT_OTHER)
Admission: EM | Admit: 2024-01-10 | Discharge: 2024-01-30 | DRG: 871 | Disposition: A | Discharge status: Home Health | Attending: Internal Medicine | Admitting: Internal Medicine

## 2024-01-10 ENCOUNTER — Encounter (HOSPITAL_BASED_OUTPATIENT_CLINIC_OR_DEPARTMENT_OTHER): Payer: Self-pay

## 2024-01-10 ENCOUNTER — Other Ambulatory Visit: Payer: Self-pay

## 2024-01-10 DIAGNOSIS — M109 Gout, unspecified: Secondary | ICD-10-CM | POA: Diagnosis present

## 2024-01-10 DIAGNOSIS — A419 Sepsis, unspecified organism: Principal | ICD-10-CM | POA: Diagnosis present

## 2024-01-10 DIAGNOSIS — F419 Anxiety disorder, unspecified: Secondary | ICD-10-CM | POA: Diagnosis present

## 2024-01-10 DIAGNOSIS — Z886 Allergy status to analgesic agent status: Secondary | ICD-10-CM

## 2024-01-10 DIAGNOSIS — R6521 Severe sepsis with septic shock: Secondary | ICD-10-CM | POA: Diagnosis present

## 2024-01-10 DIAGNOSIS — R338 Other retention of urine: Secondary | ICD-10-CM | POA: Diagnosis present

## 2024-01-10 DIAGNOSIS — I10 Essential (primary) hypertension: Secondary | ICD-10-CM | POA: Diagnosis not present

## 2024-01-10 DIAGNOSIS — G4733 Obstructive sleep apnea (adult) (pediatric): Secondary | ICD-10-CM | POA: Diagnosis present

## 2024-01-10 DIAGNOSIS — E782 Mixed hyperlipidemia: Secondary | ICD-10-CM | POA: Diagnosis present

## 2024-01-10 DIAGNOSIS — N184 Chronic kidney disease, stage 4 (severe): Secondary | ICD-10-CM | POA: Diagnosis not present

## 2024-01-10 DIAGNOSIS — Z6837 Body mass index (BMI) 37.0-37.9, adult: Secondary | ICD-10-CM | POA: Diagnosis not present

## 2024-01-10 DIAGNOSIS — G894 Chronic pain syndrome: Secondary | ICD-10-CM | POA: Diagnosis present

## 2024-01-10 DIAGNOSIS — R579 Shock, unspecified: Secondary | ICD-10-CM | POA: Diagnosis present

## 2024-01-10 DIAGNOSIS — E441 Mild protein-calorie malnutrition: Secondary | ICD-10-CM | POA: Diagnosis present

## 2024-01-10 DIAGNOSIS — Z1152 Encounter for screening for COVID-19: Secondary | ICD-10-CM

## 2024-01-10 DIAGNOSIS — N401 Enlarged prostate with lower urinary tract symptoms: Secondary | ICD-10-CM | POA: Diagnosis present

## 2024-01-10 DIAGNOSIS — E875 Hyperkalemia: Secondary | ICD-10-CM | POA: Diagnosis present

## 2024-01-10 DIAGNOSIS — Z79899 Other long term (current) drug therapy: Secondary | ICD-10-CM

## 2024-01-10 DIAGNOSIS — E66813 Obesity, class 3: Secondary | ICD-10-CM | POA: Diagnosis present

## 2024-01-10 DIAGNOSIS — Z7982 Long term (current) use of aspirin: Secondary | ICD-10-CM

## 2024-01-10 DIAGNOSIS — N186 End stage renal disease: Secondary | ICD-10-CM | POA: Diagnosis present

## 2024-01-10 DIAGNOSIS — Z992 Dependence on renal dialysis: Secondary | ICD-10-CM | POA: Diagnosis not present

## 2024-01-10 DIAGNOSIS — R9431 Abnormal electrocardiogram [ECG] [EKG]: Secondary | ICD-10-CM | POA: Diagnosis not present

## 2024-01-10 DIAGNOSIS — K5900 Constipation, unspecified: Secondary | ICD-10-CM | POA: Diagnosis present

## 2024-01-10 DIAGNOSIS — R570 Cardiogenic shock: Secondary | ICD-10-CM

## 2024-01-10 DIAGNOSIS — Z7984 Long term (current) use of oral hypoglycemic drugs: Secondary | ICD-10-CM | POA: Diagnosis not present

## 2024-01-10 DIAGNOSIS — N400 Enlarged prostate without lower urinary tract symptoms: Secondary | ICD-10-CM | POA: Diagnosis not present

## 2024-01-10 DIAGNOSIS — I129 Hypertensive chronic kidney disease with stage 1 through stage 4 chronic kidney disease, or unspecified chronic kidney disease: Secondary | ICD-10-CM | POA: Diagnosis not present

## 2024-01-10 DIAGNOSIS — I120 Hypertensive chronic kidney disease with stage 5 chronic kidney disease or end stage renal disease: Secondary | ICD-10-CM | POA: Diagnosis not present

## 2024-01-10 DIAGNOSIS — J189 Pneumonia, unspecified organism: Secondary | ICD-10-CM | POA: Diagnosis present

## 2024-01-10 DIAGNOSIS — G9341 Metabolic encephalopathy: Secondary | ICD-10-CM | POA: Diagnosis present

## 2024-01-10 DIAGNOSIS — E872 Acidosis, unspecified: Secondary | ICD-10-CM | POA: Diagnosis present

## 2024-01-10 DIAGNOSIS — I1311 Hypertensive heart and chronic kidney disease without heart failure, with stage 5 chronic kidney disease, or end stage renal disease: Secondary | ICD-10-CM | POA: Diagnosis present

## 2024-01-10 DIAGNOSIS — Z7409 Other reduced mobility: Secondary | ICD-10-CM | POA: Diagnosis present

## 2024-01-10 DIAGNOSIS — E1122 Type 2 diabetes mellitus with diabetic chronic kidney disease: Secondary | ICD-10-CM | POA: Diagnosis present

## 2024-01-10 DIAGNOSIS — N179 Acute kidney failure, unspecified: Secondary | ICD-10-CM | POA: Diagnosis present

## 2024-01-10 LAB — URINALYSIS, W/ REFLEX TO CULTURE (INFECTION SUSPECTED)
Bilirubin Urine: NEGATIVE
Glucose, UA: NEGATIVE mg/dL
Hgb urine dipstick: NEGATIVE
Ketones, ur: NEGATIVE mg/dL
Nitrite: NEGATIVE
Protein, ur: 100 mg/dL — AB
RBC / HPF: NONE SEEN RBC/hpf (ref 0–5)
Specific Gravity, Urine: 1.025 (ref 1.005–1.030)
pH: 5 (ref 5.0–8.0)

## 2024-01-10 LAB — COMPREHENSIVE METABOLIC PANEL WITH GFR
ALT: 21 U/L (ref 0–44)
AST: 11 U/L — ABNORMAL LOW (ref 15–41)
Albumin: 4.4 g/dL (ref 3.5–5.0)
Alkaline Phosphatase: 79 U/L (ref 38–126)
Anion gap: 17 — ABNORMAL HIGH (ref 5–15)
BUN: 109 mg/dL — ABNORMAL HIGH (ref 8–23)
CO2: 11 mmol/L — ABNORMAL LOW (ref 22–32)
Calcium: 8.6 mg/dL — ABNORMAL LOW (ref 8.9–10.3)
Chloride: 105 mmol/L (ref 98–111)
Creatinine, Ser: 6.97 mg/dL — ABNORMAL HIGH (ref 0.61–1.24)
GFR, Estimated: 8 mL/min — ABNORMAL LOW
Glucose, Bld: 136 mg/dL — ABNORMAL HIGH (ref 70–99)
Potassium: 6.7 mmol/L (ref 3.5–5.1)
Sodium: 133 mmol/L — ABNORMAL LOW (ref 135–145)
Total Bilirubin: 0.3 mg/dL (ref 0.0–1.2)
Total Protein: 7 g/dL (ref 6.5–8.1)

## 2024-01-10 LAB — TROPONIN T, HIGH SENSITIVITY
Troponin T High Sensitivity: 157 ng/L (ref 0–19)
Troponin T High Sensitivity: 132 ng/L (ref 0–19)

## 2024-01-10 LAB — GLUCOSE, CAPILLARY
Glucose-Capillary: 121 mg/dL — ABNORMAL HIGH (ref 70–99)
Glucose-Capillary: 153 mg/dL — ABNORMAL HIGH (ref 70–99)

## 2024-01-10 LAB — CBC WITH DIFFERENTIAL/PLATELET
Abs Immature Granulocytes: 0.08 K/uL — ABNORMAL HIGH (ref 0.00–0.07)
Basophils Absolute: 0 K/uL (ref 0.0–0.1)
Basophils Relative: 0 %
Eosinophils Absolute: 0 K/uL (ref 0.0–0.5)
Eosinophils Relative: 0 %
HCT: 33.8 % — ABNORMAL LOW (ref 39.0–52.0)
Hemoglobin: 10.2 g/dL — ABNORMAL LOW (ref 13.0–17.0)
Immature Granulocytes: 1 %
Lymphocytes Relative: 8 %
Lymphs Abs: 0.9 K/uL (ref 0.7–4.0)
MCH: 28.6 pg (ref 26.0–34.0)
MCHC: 30.2 g/dL (ref 30.0–36.0)
MCV: 94.7 fL (ref 80.0–100.0)
Monocytes Absolute: 0.6 K/uL (ref 0.1–1.0)
Monocytes Relative: 5 %
Neutro Abs: 9.9 K/uL — ABNORMAL HIGH (ref 1.7–7.7)
Neutrophils Relative %: 86 %
Platelets: 149 K/uL — ABNORMAL LOW (ref 150–400)
RBC: 3.57 MIL/uL — ABNORMAL LOW (ref 4.22–5.81)
RDW: 14.3 % (ref 11.5–15.5)
WBC: 11.6 K/uL — ABNORMAL HIGH (ref 4.0–10.5)
nRBC: 0 % (ref 0.0–0.2)

## 2024-01-10 LAB — D-DIMER, QUANTITATIVE: D-Dimer, Quant: 0.97 ug{FEU}/mL — ABNORMAL HIGH (ref 0.00–0.50)

## 2024-01-10 LAB — URINE DRUG SCREEN
Amphetamines: NEGATIVE
Barbiturates: NEGATIVE
Benzodiazepines: NEGATIVE
Cocaine: NEGATIVE
Fentanyl: NEGATIVE
Methadone Scn, Ur: NEGATIVE
Opiates: POSITIVE — AB
Tetrahydrocannabinol: NEGATIVE

## 2024-01-10 LAB — APTT: aPTT: 43 s — ABNORMAL HIGH (ref 24–36)

## 2024-01-10 LAB — CBG MONITORING, ED
Glucose-Capillary: 113 mg/dL — ABNORMAL HIGH (ref 70–99)
Glucose-Capillary: 163 mg/dL — ABNORMAL HIGH (ref 70–99)

## 2024-01-10 LAB — PRO BRAIN NATRIURETIC PEPTIDE: Pro Brain Natriuretic Peptide: 255 pg/mL

## 2024-01-10 LAB — LACTIC ACID, PLASMA
Lactic Acid, Venous: 0.5 mmol/L (ref 0.5–1.9)
Lactic Acid, Venous: 0.8 mmol/L (ref 0.5–1.9)

## 2024-01-10 LAB — RESP PANEL BY RT-PCR (RSV, FLU A&B, COVID)  RVPGX2
Influenza A by PCR: NEGATIVE
Influenza B by PCR: NEGATIVE
Resp Syncytial Virus by PCR: NEGATIVE
SARS Coronavirus 2 by RT PCR: NEGATIVE

## 2024-01-10 LAB — PROTIME-INR
INR: 1.1 (ref 0.8–1.2)
INR: 1.2 (ref 0.8–1.2)
Prothrombin Time: 15.1 s (ref 11.4–15.2)
Prothrombin Time: 15.4 s — ABNORMAL HIGH (ref 11.4–15.2)

## 2024-01-10 LAB — SALICYLATE LEVEL: Salicylate Lvl: <7 mg/dL — ABNORMAL LOW (ref 7.0–30.0)

## 2024-01-10 LAB — ETHANOL: Alcohol, Ethyl (B): <15 mg/dL

## 2024-01-10 LAB — ACETAMINOPHEN LEVEL: Acetaminophen (Tylenol), Serum: <10 ug/mL — ABNORMAL LOW (ref 10–30)

## 2024-01-10 MED ORDER — HEPARIN SODIUM (PORCINE) 5000 UNIT/ML IJ SOLN
5000.0000 [IU] | Freq: Three times a day (TID) | INTRAMUSCULAR | Status: DC
  Administered 2024-01-11 – 2024-01-30 (×58): 5000 [IU] via SUBCUTANEOUS
  Filled 2024-01-10 (×58): qty 1

## 2024-01-10 MED ORDER — METRONIDAZOLE 500 MG/100ML IV SOLN
500.0000 mg | Freq: Once | INTRAVENOUS | Status: AC
  Administered 2024-01-10: 500 mg via INTRAVENOUS
  Filled 2024-01-10: qty 100

## 2024-01-10 MED ORDER — SODIUM ZIRCONIUM CYCLOSILICATE 10 G PO PACK: 10.0000 g | PACK | Freq: Three times a day (TID) | ORAL | Status: DC

## 2024-01-10 MED ORDER — PRISMASOL BGK 2/3.5 32-2-3.5 MEQ/L EC SOLN: Status: DC

## 2024-01-10 MED ORDER — CHLORHEXIDINE GLUCONATE CLOTH 2 % EX PADS
6.0000 | MEDICATED_PAD | Freq: Every day | CUTANEOUS | Status: DC
  Filled 2024-01-10: qty 6

## 2024-01-10 MED ORDER — CHLORHEXIDINE GLUCONATE CLOTH 2 % EX PADS
6.0000 | MEDICATED_PAD | Freq: Every day | CUTANEOUS | Status: AC
  Administered 2024-01-10 – 2024-01-19 (×8): 6 via TOPICAL
  Filled 2024-01-10: qty 6

## 2024-01-10 MED ORDER — ORAL CARE MOUTH RINSE: 15.0000 mL | OROMUCOSAL | Status: DC | PRN

## 2024-01-10 MED ORDER — HEPARIN SODIUM (PORCINE) 1000 UNIT/ML DIALYSIS
1000.0000 [IU] | INTRAMUSCULAR | Status: DC | PRN
  Administered 2024-01-10 – 2024-01-12 (×2): 2400 [IU] via INTRAVENOUS_CENTRAL
  Filled 2024-01-10: qty 6
  Filled 2024-01-10: qty 3
  Filled 2024-01-10: qty 6

## 2024-01-10 MED ORDER — LACTATED RINGERS IV BOLUS (SEPSIS)
500.0000 mL | Freq: Once | INTRAVENOUS | Status: AC
  Administered 2024-01-10: 500 mL via INTRAVENOUS

## 2024-01-10 MED ORDER — LACTATED RINGERS IV BOLUS (SEPSIS)
1000.0000 mL | Freq: Once | INTRAVENOUS | Status: AC
  Administered 2024-01-10: 1000 mL via INTRAVENOUS

## 2024-01-10 MED ORDER — HEPARIN SODIUM (PORCINE) 1000 UNIT/ML IJ SOLN: 1000.0000 [IU] | Freq: Once | INTRAMUSCULAR | Status: DC

## 2024-01-10 MED ORDER — VANCOMYCIN HCL IN DEXTROSE 1-5 GM/200ML-% IV SOLN
1000.0000 mg | Freq: Once | INTRAVENOUS | Status: AC
  Administered 2024-01-10: 1000 mg via INTRAVENOUS
  Filled 2024-01-10: qty 200

## 2024-01-10 MED ORDER — INSULIN ASPART 100 UNIT/ML IJ SOLN
INTRAMUSCULAR | Status: AC
  Filled 2024-01-10: qty 1

## 2024-01-10 MED ORDER — SODIUM CHLORIDE 0.9 % IV SOLN
500.0000 [IU]/h | INTRAVENOUS | Status: DC
  Administered 2024-01-11 (×2): 500 [IU]/h via INTRAVENOUS_CENTRAL
  Filled 2024-01-10 (×2): qty 2
  Filled 2024-01-10: qty 10000

## 2024-01-10 MED ORDER — DEXTROSE 50 % IV SOLN: 1.0000 | Freq: Once | INTRAVENOUS | Status: DC

## 2024-01-10 MED ORDER — SODIUM BICARBONATE 8.4 % IV SOLN
100.0000 meq | Freq: Once | INTRAVENOUS | Status: AC
  Administered 2024-01-10: 100 meq via INTRAVENOUS
  Filled 2024-01-10: qty 50

## 2024-01-10 MED ORDER — POLYETHYLENE GLYCOL 3350 17 G PO PACK
17.0000 g | PACK | Freq: Every day | ORAL | Status: DC | PRN
  Administered 2024-01-11: 17 g
  Filled 2024-01-10: qty 1

## 2024-01-10 MED ORDER — INSULIN ASPART 100 UNIT/ML IV SOLN: 10.0000 [IU] | Freq: Once | INTRAVENOUS | Status: DC

## 2024-01-10 MED ORDER — SENNA 8.6 MG PO TABS
1.0000 | ORAL_TABLET | Freq: Two times a day (BID) | ORAL | Status: DC | PRN
  Administered 2024-01-11: 8.6 mg
  Filled 2024-01-10: qty 1

## 2024-01-10 MED ORDER — SODIUM CHLORIDE 0.9 % IV SOLN
250.0000 mL | INTRAVENOUS | Status: AC
  Administered 2024-01-11: 250 mL via INTRAVENOUS

## 2024-01-10 MED ORDER — VANCOMYCIN HCL 1500 MG/300ML IV SOLN
1500.0000 mg | Freq: Every day | INTRAVENOUS | Status: DC
  Administered 2024-01-11: 1500 mg via INTRAVENOUS
  Filled 2024-01-10: qty 300

## 2024-01-10 MED ORDER — CALCIUM GLUCONATE 10 % IV SOLN: 1.0000 g | Freq: Once | INTRAVENOUS | Status: DC

## 2024-01-10 MED ORDER — NOREPINEPHRINE 16 MG/250ML-% IV SOLN: 0.0000 ug/min | INTRAVENOUS | Status: DC

## 2024-01-10 MED ORDER — INSULIN ASPART 100 UNIT/ML IV SOLN
5.0000 [IU] | Freq: Once | INTRAVENOUS | Status: AC
  Administered 2024-01-10: 5 [IU] via INTRAVENOUS
  Filled 2024-01-10: qty 5

## 2024-01-10 MED ORDER — STERILE WATER FOR INJECTION IV SOLN
INTRAVENOUS | Status: DC
  Filled 2024-01-10 (×6): qty 150

## 2024-01-10 MED ORDER — NOREPINEPHRINE 16 MG/250ML-% IV SOLN
0.0000 ug/min | INTRAVENOUS | Status: DC
  Administered 2024-01-11: 10 ug/min via INTRAVENOUS
  Filled 2024-01-10: qty 250

## 2024-01-10 MED ORDER — NOREPINEPHRINE 4 MG/250ML-% IV SOLN
0.0000 ug/min | INTRAVENOUS | Status: DC
  Administered 2024-01-10: 5 ug/min via INTRAVENOUS
  Filled 2024-01-10: qty 250

## 2024-01-10 MED ORDER — PIPERACILLIN-TAZOBACTAM 3.375 G IVPB
3.3750 g | Freq: Four times a day (QID) | INTRAVENOUS | Status: DC
  Administered 2024-01-11 (×2): 3.375 g via INTRAVENOUS
  Filled 2024-01-10 (×2): qty 50

## 2024-01-10 MED ORDER — NOREPINEPHRINE 4 MG/250ML-% IV SOLN: 0.0000 ug/min | INTRAVENOUS | Status: DC

## 2024-01-10 MED ORDER — DEXTROSE 50 % IV SOLN
1.0000 | Freq: Once | INTRAVENOUS | Status: AC
  Administered 2024-01-10: 50 mL via INTRAVENOUS
  Filled 2024-01-10: qty 50

## 2024-01-10 MED ORDER — SODIUM CHLORIDE 0.9 % IV SOLN
2.0000 g | Freq: Once | INTRAVENOUS | Status: AC
  Administered 2024-01-10: 2 g via INTRAVENOUS
  Filled 2024-01-10: qty 12.5

## 2024-01-10 MED ORDER — CALCIUM GLUCONATE-NACL 1-0.675 GM/50ML-% IV SOLN
1.0000 g | Freq: Once | INTRAVENOUS | Status: AC
  Administered 2024-01-10: 1000 mg via INTRAVENOUS
  Filled 2024-01-10: qty 50

## 2024-01-10 MED ORDER — LACTATED RINGERS IV SOLN: INTRAVENOUS | Status: DC

## 2024-01-10 NOTE — Progress Notes (Signed)
 eLink Physician-Brief Progress Note Patient Name: Peter Bryan DOB: 27-Aug-1960 MRN: [id number]   Date of Service  01/10/2024  HPI/Events of Note  Patient is on home CPAP.  eICU Interventions  CPAP ordered.        Lindyn Vossler U Marcelina Mclaurin 01/10/2024, 11:31 PM

## 2024-01-10 NOTE — Progress Notes (Signed)
 PHARMACY NOTE:  ANTIMICROBIAL RENAL DOSAGE ADJUSTMENT  Current antimicrobial regimen includes a mismatch between antimicrobial dosage and estimated renal function.  As per policy approved by the Pharmacy & Therapeutics and Medical Executive Committees, the antimicrobial dosage will be adjusted accordingly.  Current antimicrobial dosage:  Zosyn  2.25gm IV every 6h + Vancomycin  750mg  IV q12 hours  Indication: Septic shock  Renal Function:  Estimated Creatinine Clearance: 14.9 mL/min (A) (by C-G formula based on SCr of 6.97 mg/dL (H)). []      On intermittent HD, scheduled: [x]      On CRRT    Antimicrobial dosage has been changed to:    -Zosyn  3.375gm IV every 6h -Vancomycin  1500mg  IV every 24 hours  Additional comments:   Thank you for allowing pharmacy to be a part of this patient's care.  Lynwood Poplar, PharmD, BCPS Clinical Pharmacist 01/10/2024 11:47 PM

## 2024-01-10 NOTE — Progress Notes (Signed)
 eLink Physician-Brief Progress Note Patient Name: Peter Bryan DOB: 04/07/1960 MRN: [id number]   Date of Service  01/10/2024  HPI/Events of Note  Patient admitted from outside hospital with altered mental status, septic shock, acute on chronic  renal failure, and severe metabolic acidosis. CRRT is being initiated.  eICU Interventions  New Patient Evaluation.        Peter Bryan Peter Bryan 01/10/2024, 11:06 PM

## 2024-01-10 NOTE — ED Notes (Signed)
CBG 163 

## 2024-01-10 NOTE — Progress Notes (Signed)
 Pharmacy Antibiotic Note  Peter Bryan is a 64 y.o. male admitted on 01/10/2024 with AMS and muscle spasms.  Pharmacy has been consulted for zosyn /vancomycin  dosing for septic shock.  -WBC 11.6, afebrile, starting CRRT -Cefepime /flagyl , vancomycin  x1 in ED -MRSA PCR/blood cultures collected- Flu/COVID negative  -CTA: possible early signs of pneumonia    Plan: -Zosyn  3.375gm IV every 6 hours  -Vancomycin  1g IV every 24 hours  -Follow up signs of clinical improvement, LOT, de-escalation of antibiotics   Height: 5' 11 (180.3 cm) Weight: (!) 137 kg (302 lb 0.5 oz) IBW/kg (Calculated) : 75.3  Temp (24hrs), Avg:94.2 F (34.6 C), Min:93.7 F (34.3 C), Max:94.5 F (34.7 C)  Recent Labs  Lab 01/10/24 1753 01/10/24 1758 01/10/24 2024  WBC  --  11.6*  --   CREATININE  --  6.97*  --   LATICACIDVEN 0.5  --  0.8    Estimated Creatinine Clearance: 14.9 mL/min (A) (by C-G formula based on SCr of 6.97 mg/dL (H)).    Allergies[1]  Antimicrobials this admission: Zosyn  1/7 >>  Vancomycin  1/6 >>   Microbiology results: 1/6 BCx:  1/6 MRSA PCR:   Lynwood Poplar, PharmD, BCPS Clinical Pharmacist 01/10/2024 10:54 PM       [1] No Known Allergies

## 2024-01-10 NOTE — Progress Notes (Signed)
 Brief Nephrology note: 64 y/o male with h/o CKD4 transferred from [facility]  to [facility] ICU for AMS, septic shock, AKI on CKD and hyperkalemia. Contacted by ICU team for CRRT orders. -Currently on levophed  10 mcg, K 6.7, co2 11, BUN 109, cr 6.97. -Received IVF fluid and temporizing treatment for hyperkalemia in ER. -Per ICU team, placing HD line now after discussed with family for dialysis/crrt. - will start pre-filter 2k, post filter Na bicarb and dialysate 2k/3.5 ca. -no UF overnight. -full consult note to follow. -d/w ICU team.  Micheline Markes, CKA

## 2024-01-10 NOTE — H&P (Signed)
 "  NAME:  Peter Bryan, MRN:  [id number], DOB:  [birth year], LOS: 0 ADMISSION DATE:  01/10/2024, CONSULTATION DATE: 01/10/2024 REFERRING MD: ED team, CHIEF COMPLAINT: Altered mental status  History of Present Illness:  64 year old male with history of HTN, stage IV CKD with recurrent hyperkalemia, DM2, OSA, morbid obesity, multiple back surgeries, chronic pain syndrome, presents to the med [facility] ED, brought by the son for altered mental status.  Also complaining of constipation at baseline from prior surgeries, and has not urinated for the last 24 hours.  He is nonambulatory at baseline and on disability from prior surgeries.  Patient reports nausea, chest pain, shortness of breath and urine retention.  Denies any cough, or upper respiratory symptoms At the ED was found to be lethargic, AOX3, protecting airway on nasal cannula, in shock requiring levo, and severe metabolic acidosis bicarb 11, anion gap of 17, and hyperkalemia of 6.7.  Creatinine 6.9, with GFR of 8. EKG did not show any signs of acute ischemia.  Tropes was 152 ED team is initiating temporizing measures for hyperkalemia and severe metabolic acidosis, placing a central line, patient will be transferred to [facility].   Pertinent  Medical History  HTN, CKD stage IV, recurrent hyperkalemia, DM2, OSA, morbid obesity, multiple back surgeries  Significant Hospital Events: Including procedures, antibiotic start and stop dates in addition to other pertinent events   Admission to [facility], ICU 01/10/2023  Interim History / Subjective:    Objective    Blood pressure (!) 96/37, pulse 77, temperature (!) 93.7 F (34.3 C), temperature source Rectal, resp. rate 14, SpO2 99%.        Intake/Output Summary (Last 24 hours) at 01/10/2024 1929 Last data filed at 01/10/2024 1919 Gross per 24 hour  Intake 1193.41 ml  Output --  Net 1193.41 ml   There were no vitals filed for this visit.  Examination: General: Critically ill, lying in  bed HENT: NCAT Lungs: Medical chest wall movement, clear to auscultation Cardiovascular: S1-S2 normal, no murmur Abdomen: Soft nontender nondistended Extremities: Warm, minimal edema Neuro: AO X3, lethargic, moves all extremities GU: Deferred  Resolved problem list   Assessment and Plan   64 year old male with history of HTN, CKD stage IV, recurrent hyperkalemia, DM2, presents from OSH with altered mental status, shortness of breath and urinary retention, found to be in shock & severe metabolic acidosis  Acute metabolic encephalopathy Circulatory shock Septic shock Severe metabolic acidosis High anion gap metabolic acidosis AKI on CKD 4 Hyperkalemia DM 2    Plan - ICU for close monitoring and management - Wean off vasopressors as tolerated to maintain MAP> 65 - Shock dose steroid -Hyperkalemia management -lactic acid - Temporized with bicarb pushes - Nephrology team on board, CRRT initiated - Strict I&O's, Foley - Infectious workup,empirically, with Vanco and Zosyn  - CT with contrast for infectious source workup, including ruling out mesenteric ischemia - Complete echo - Glucose management      Labs   CBC: Recent Labs  Lab 01/10/24 1758  WBC 11.6*  NEUTROABS 9.9*  HGB 10.2*  HCT 33.8*  MCV 94.7  PLT 149*    Basic Metabolic Panel: Recent Labs  Lab 01/10/24 1758  NA 133*  K 6.7*  CL 105  CO2 11*  GLUCOSE 136*  BUN 109*  CREATININE 6.97*  CALCIUM  8.6*   GFR: CrCl cannot be calculated (Unknown ideal weight.). Recent Labs  Lab 01/10/24 1753 01/10/24 1758  WBC  --  11.6*  LATICACIDVEN 0.5  --  Liver Function Tests: Recent Labs  Lab 01/10/24 1758  AST 11*  ALT 21  ALKPHOS 79  BILITOT 0.3  PROT 7.0  ALBUMIN 4.4   No results for input(s): LIPASE, AMYLASE in the last 168 hours. No results for input(s): AMMONIA in the last 168 hours.  ABG No results found for: PHART, PCO2ART, PO2ART, HCO3, TCO2, ACIDBASEDEF,  O2SAT   Coagulation Profile: Recent Labs  Lab 01/10/24 1758  INR 1.1    Cardiac Enzymes: No results for input(s): CKTOTAL, CKMB, CKMBINDEX, TROPONINI in the last 168 hours.  HbA1C: Hgb A1c MFr Bld  Date/Time Value Ref Range Status  03/07/2019 01:19 PM 6.8 (H) 4.8 - 5.6 % Final    Comment:    (NOTE) Pre diabetes:          5.7%-6.4% Diabetes:              >6.4% Glycemic control for   <7.0% adults with diabetes     CBG: Recent Labs  Lab 01/10/24 1745  GLUCAP 113*    Review of Systems:   Negative except above  Past Medical History:  He,  has a past medical history of Chronic kidney disease, Diabetes mellitus without complication (HCC), Hypertension, Neuromuscular disorder (HCC), Obesity, and Sleep apnea.   Surgical History:   Past Surgical History:  Procedure Laterality Date   BACK SURGERY  1/92-08/2016   multiple levels. C5-S1   CARPAL TUNNEL RELEASE     CERVICAL SPINE SURGERY     ROTATOR CUFF REPAIR     SHOULDER ARTHROSCOPY WITH OPEN ROTATOR CUFF REPAIR AND DISTAL CLAVICLE ACROMINECTOMY Right 03/16/2019   Procedure: SHOULDER ARTHROSCOPY WITH OPEN ROTATOR CUFF REPAIR AND DISTAL CLAVICLE ACROMINECTOMY;  Surgeon: Shari Sieving, MD;  Location: [facility] ORS;  Service: Orthopedics;  Laterality: Right;     Social History:   reports that he has never smoked. He has never used smokeless tobacco. He reports that he does not drink alcohol and does not use drugs.   Family History:  His family history is not on file.   Allergies Allergies[1]   Home Medications  Prior to Admission medications  Medication Sig Start Date End Date Taking? Authorizing Provider  aspirin  EC 81 MG tablet Take 1 tablet (81 mg total) by mouth daily. Swallow whole. 01/21/22   Lonni Slain, MD  baclofen (LIORESAL) 10 MG tablet Take 10 mg by mouth daily as needed for muscle spasms.    [provider]  gabapentin (NEURONTIN) 300 MG capsule Take 300 mg by mouth 4 (four) times  daily. 07/22/08   [provider]  glipiZIDE (GLUCOTROL XL) 2.5 MG 24 hr tablet Take 2.5 mg by mouth daily with breakfast. 12/31/19   [provider]  hydrALAZINE (APRESOLINE) 25 MG tablet Take 25 mg by mouth 2 (two) times daily. 09/04/21   [provider]  HYDROcodone -acetaminophen  (NORCO) 10-325 MG per tablet Take 2-2.5 tablets by mouth See admin instructions. Take 2.5 mg in the morning and at lunch as 2 tables in the evening    [provider]  JANUVIA 50 MG tablet Take 1 tablet by mouth daily. 04/15/16   [provider]  JARDIANCE  10 MG TABS tablet TAKE 1 TABLET DAILY BEFORE BREAKFAST 07/29/23   Lonni Slain, MD  LOKELMA  10 g PACK packet Take 10 g by mouth 3 (three) times a week.    [provider]  magnesium oxide (MAG-OX) 400 MG tablet Take 400 mg by mouth daily. 09/26/20   [provider]  Peppermint Oil (  IBGARD) 90 MG CPCR Take by mouth. 10/08/21   [provider]  rosuvastatin (CRESTOR) 5 MG tablet Take 5 mg by mouth daily.      [provider]  sodium bicarbonate  650 MG tablet Take 650 mg by mouth daily. 09/26/20   [provider]  testosterone  cypionate (DEPOTESTOSTERONE CYPIONATE) 200 MG/ML injection Inject 200 mg into the muscle once a week. 02/24/19   [provider]  thiamine  50 MG tablet Take 50 mg by mouth 2 (two) times daily.    [provider]  TRULICITY 0.75 MG/0.5ML SOPN Inject 0.75 mg into the skin. Patient not taking: Reported on 09/03/2022 01/18/22   [provider]  Turmeric (QC TUMERIC COMPLEX PO) Take 4,500 mg by mouth daily at 12 noon.    [provider]  valsartan-hydrochlorothiazide (DIOVAN-HCT) 320-25 MG tablet Take 1 tablet by mouth at bedtime. Midnight    [provider]  Vitamin D, Ergocalciferol, (DRISDOL) 1.25 MG (50000 UNIT) CAPS capsule Take 50,000 Units by mouth 2 (two) times a week.    [provider]  zolpidem   (AMBIEN ) 10 MG tablet Take 10 mg by mouth at bedtime.  01/31/19   [provider]     Critical care time: 40 mins     Lenny Drought, MD  [facility] Prefer epic messenger for cross cover needs   Critical care time was exclusive of separately billable procedures and treating other patients.  Critical care was necessary to treat or prevent imminent or life-threatening deterioration.  Critical care was time spent personally by me on the following activities: development of treatment plan with patient and/or surrogate as well as nursing, discussions with consultants, evaluation of patient's response to treatment, examination of patient, obtaining history from patient or surrogate, ordering and performing treatments and interventions, ordering and review of laboratory studies, ordering and review of radiographic studies, pulse oximetry, re-evaluation of patient's condition and participation in multidisciplinary rounds.     [1] No Known Allergies  "

## 2024-01-10 NOTE — ED Triage Notes (Addendum)
 Reports muscle spasms, feeling confused, head pressure, jaw pain, weakness since yesterday. Pt slow to respond A&Ox4  Denies NV, fevers

## 2024-01-10 NOTE — ED Notes (Signed)
Patient transported to CT and back without event.

## 2024-01-10 NOTE — Procedures (Signed)
 Central Venous Catheter Insertion Procedure Note  Peter Bryan  [id number]  [birth year]  Date:01/10/2024  Time:10:58 PM   Provider Performing:Keysi Oelkers Bryan   Procedure: Insertion of Non-tunneled Central Venous Catheter(36556)with US  guidance (23062)    Indication(s) Hemodialysis  Consent Risks of the procedure as well as the alternatives and risks of each were explained to the patient and/or caregiver.  Consent for the procedure was obtained and is signed in the bedside chart  Anesthesia Topical only with 1% lidocaine    Timeout Verified patient identification, verified procedure, site/side was marked, verified correct patient position, special equipment/implants available, medications/allergies/relevant history reviewed, required imaging and test results available.  Sterile Technique Maximal sterile technique including full sterile barrier drape, hand hygiene, sterile gown, sterile gloves, mask, hair covering, sterile ultrasound probe cover (if used).  Procedure Description Area of catheter insertion was cleaned with chlorhexidine  and draped in sterile fashion.   With real-time ultrasound guidance a HD catheter was placed into the right internal jugular vein.  Nonpulsatile blood flow and easy flushing noted in all ports.  The catheter was sutured in place and sterile dressing applied.  Complications/Tolerance None; patient tolerated the procedure well. Chest X-ray is ordered to verify placement for internal jugular or subclavian cannulation.  Chest x-ray is not ordered for femoral cannulation.  EBL Minimal  Specimen(s) None   Peter Meade, PA - [facility] For pager details, please see AMION or use Epic chat  After 1900, please call ELINK for cross coverage needs 01/10/2024, 10:58 PM

## 2024-01-10 NOTE — ED Provider Notes (Signed)
 " Peter Bryan Provider Note   CSN: [id number] Arrival date & time: 01/10/24  1721     Patient presents with: Fatigue   Peter Bryan is a 64 y.o. male history of diabetes, OSA, CKD, multiple spinal surgeries presents accompanied by his son with altered mental status.  Son reports that he has been slow to respond since yesterday.  He is nonambulatory at baseline and on disability from prior surgeries.  Patient has constipation at baseline from prior surgeries as well.  Patient reports that he has not urinated since yesterday.  Patient states that he is more comfortable sitting up.  Reports nausea, chest pain, shortness of breath with reclining.  Denies any abdominal pain.  No cough or URI symptoms.  Lives with son.    HPI    Past Medical History:  Diagnosis Date   Chronic kidney disease    Diabetes mellitus without complication (HCC)    Hypertension    Neuromuscular disorder (HCC)    neuropathy feet   Obesity    Sleep apnea    Past Surgical History:  Procedure Laterality Date   BACK SURGERY  1/92-08/2016   multiple levels. C5-S1   CARPAL TUNNEL RELEASE     CERVICAL SPINE SURGERY     ROTATOR CUFF REPAIR     SHOULDER ARTHROSCOPY WITH OPEN ROTATOR CUFF REPAIR AND DISTAL CLAVICLE ACROMINECTOMY Right 03/16/2019   Procedure: SHOULDER ARTHROSCOPY WITH OPEN ROTATOR CUFF REPAIR AND DISTAL CLAVICLE ACROMINECTOMY;  Surgeon: Shari Sieving, MD;  Location: [facility];  Service: Orthopedics;  Laterality: Right;     Prior to Admission medications  Medication Sig Start Date End Date Taking? Authorizing Provider  aspirin  EC 81 MG tablet Take 1 tablet (81 mg total) by mouth daily. Swallow whole. 01/21/22   Lonni Slain, MD  baclofen  (LIORESAL ) 10 MG tablet Take 10 mg by mouth daily as needed for muscle spasms.    [provider]  gabapentin  (NEURONTIN ) 300 MG capsule Take 300 mg by mouth 4 (four) times daily. 07/22/08    [provider]  glipiZIDE (GLUCOTROL XL) 2.5 MG 24 hr tablet Take 2.5 mg by mouth daily with breakfast. 12/31/19   [provider]  hydrALAZINE (APRESOLINE) 25 MG tablet Take 25 mg by mouth 2 (two) times daily. 09/04/21   [provider]  HYDROcodone -acetaminophen  (NORCO) 10-325 MG per tablet Take 2-2.5 tablets by mouth See admin instructions. Take 2.5 mg in the morning and at lunch as 2 tables in the evening    [provider]  JANUVIA 50 MG tablet Take 1 tablet by mouth daily. 04/15/16   [provider]  JARDIANCE  10 MG TABS tablet TAKE 1 TABLET DAILY BEFORE BREAKFAST 07/29/23   Lonni Slain, MD  LOKELMA  10 g PACK packet Take 10 g by mouth 3 (three) times a week.    [provider]  magnesium oxide (MAG-OX) 400 MG tablet Take 400 mg by mouth daily. 09/26/20   [provider]  Peppermint Oil (IBGARD) 90 MG CPCR Take by mouth. 10/08/21   [provider]  rosuvastatin (CRESTOR) 5 MG tablet Take 5 mg by mouth daily.      [provider]  sodium bicarbonate  650 MG tablet Take 650 mg by mouth daily. 09/26/20   [provider]  testosterone  cypionate (DEPOTESTOSTERONE CYPIONATE) 200 MG/ML injection Inject 200 mg into the muscle once a week. 02/24/19   [provider]  thiamine  50 MG tablet Take 50 mg by  mouth 2 (two) times daily.    [provider]  TRULICITY 0.75 MG/0.5ML SOPN Inject 0.75 mg into the skin. Patient not taking: Reported on 09/03/2022 01/18/22   [provider]  Turmeric (QC TUMERIC COMPLEX PO) Take 4,500 mg by mouth daily at 12 noon.    [provider]  valsartan-hydrochlorothiazide  (DIOVAN-HCT) 320-25 MG tablet Take 1 tablet by mouth at bedtime. Midnight    [provider]  Vitamin D , Ergocalciferol , (DRISDOL) 1.25 MG (50000 UNIT) CAPS capsule Take 50,000 Units by mouth 2 (two) times a week.    [provider]  zolpidem  (AMBIEN ) 10 MG tablet  Take 10 mg by mouth at bedtime.  01/31/19   [provider]    Allergies: Patient has no known allergies.    Review of Systems  Psychiatric/Behavioral:  Positive for confusion.     Updated Vital Signs BP (!) 104/45   Pulse 78   Temp (!) 93.7 F (34.3 C) (Rectal)   Resp 14   SpO2 94%   Physical Exam Vitals and nursing note reviewed.  Constitutional:      General: He is not in acute distress.    Appearance: He is well-developed.  HENT:     Head: Normocephalic and atraumatic.  Eyes:     Conjunctiva/sclera: Conjunctivae normal.  Cardiovascular:     Rate and Rhythm: Normal rate and regular rhythm.     Heart sounds: No murmur heard. Pulmonary:     Effort: Pulmonary effort is normal. No respiratory distress.     Breath sounds: Normal breath sounds.  Abdominal:     Palpations: Abdomen is soft.     Tenderness: There is no abdominal tenderness.     Comments: Mild generalized abdominal tenderness, soft nondistended  Musculoskeletal:        General: No swelling.     Cervical back: Neck supple.  Skin:    General: Skin is warm and dry.     Capillary Refill: Capillary refill takes less than 2 seconds.  Neurological:     Mental Status: He is alert.     Comments: Patient is lethargic appearing. He is oriented to place, self and time.  There is no abnormal phonation. Symmetric smile without facial droop. No pronator drift. Moves all extremities spontaneously. 5/5 strength in upper and lower extremities. No sensation deficit. There is no nystagmus. EOMI, PERRL.  Psychiatric:        Mood and Affect: Mood normal.     (all labs ordered are listed, but only abnormal results are displayed) Labs Reviewed  COMPREHENSIVE METABOLIC PANEL WITH GFR - Abnormal; Notable for the following components:      Result Value   Sodium 133 (*)    Potassium 6.7 (*)    CO2 11 (*)    Glucose, Bld 136 (*)    BUN 109 (*)    Creatinine, Ser 6.97 (*)    Calcium  8.6 (*)    AST 11 (*)    GFR,  Estimated 8 (*)    Anion gap 17 (*)    All other components within normal limits  CBC WITH DIFFERENTIAL/PLATELET - Abnormal; Notable for the following components:   WBC 11.6 (*)    RBC 3.57 (*)    Hemoglobin 10.2 (*)    HCT 33.8 (*)    Platelets 149 (*)    Neutro Abs 9.9 (*)    Abs Immature Granulocytes 0.08 (*)    All other components within normal limits  URINALYSIS, W/ REFLEX TO CULTURE (  INFECTION SUSPECTED) - Abnormal; Notable for the following components:   Protein, ur 100 (*)    Leukocytes,Ua SMALL (*)    Bacteria, UA FEW (*)    All other components within normal limits  ACETAMINOPHEN  LEVEL - Abnormal; Notable for the following components:   Acetaminophen  (Tylenol ), Serum <10 (*)    All other components within normal limits  SALICYLATE LEVEL - Abnormal; Notable for the following components:   Salicylate Lvl <7.0 (*)    All other components within normal limits  URINE DRUG SCREEN - Abnormal; Notable for the following components:   Opiates POSITIVE (*)    All other components within normal limits  CBG MONITORING, ED - Abnormal; Notable for the following components:   Glucose-Capillary 113 (*)    All other components within normal limits  TROPONIN T, HIGH SENSITIVITY - Abnormal; Notable for the following components:   Troponin T High Sensitivity 157 (*)    All other components within normal limits  RESP PANEL BY RT-PCR (RSV, FLU A&B, COVID)  RVPGX2  CULTURE, BLOOD (ROUTINE X 2)  CULTURE, BLOOD (ROUTINE X 2)  LACTIC ACID, PLASMA  PROTIME-INR  PRO BRAIN NATRIURETIC PEPTIDE  ETHANOL  LACTIC ACID, PLASMA  TROPONIN T, HIGH SENSITIVITY    EKG: EKG Interpretation Date/Time:  Tuesday January 10 2024 17:53:18 EST Ventricular Rate:  76 PR Interval:  188 QRS Duration:  129 QT Interval:  389 QTC Calculation: 438 R Axis:   3  Text Interpretation: Sinus rhythm Atrial premature complex Right bundle branch block Baseline wander in lead(s) V1 V2 Partial missing lead(s): V2  Confirmed by Patsey Lot [id number]) on 01/10/2024 6:21:53 PM  Radiology: [facility] Chest Port 1 View Result Date: 01/10/2024 EXAM: 1 VIEW(S) XRAY OF THE CHEST 01/10/2024 06:05:00 PM COMPARISON: Prior study dated 12/17/2020. CLINICAL HISTORY: Questionable sepsis - evaluate for abnormality FINDINGS: LUNGS AND PLEURA: Shallow inspiration. Linear atelectasis in the lung bases. No pleural effusion. No pneumothorax. HEART AND MEDIASTINUM: Cardiac enlargement. Mediastinal contours appear intact. BONES AND SOFT TISSUES: Postoperative changes in the cervical and thoracic spine. IMPRESSION: 1. No acute cardiopulmonary abnormality. 2. Cardiomegaly. Electronically signed by: Elsie Gravely MD 01/10/2024 06:17 PM EST RP Workstation: [id number]     .Critical Care  Performed by: Donnajean Lynwood DEL, PA-C Authorized by: Donnajean Lynwood DEL, PA-C   Critical care provider statement:    Critical care time (minutes):  30   Critical care was necessary to treat or prevent imminent or life-threatening deterioration of the following conditions:  Sepsis and shock   Critical care was time spent personally by me on the following activities:  Development of treatment plan with patient or surrogate, discussions with consultants, evaluation of patient's response to treatment, examination of patient, ordering and review of laboratory studies, ordering and review of radiographic studies, ordering and performing treatments and interventions, pulse oximetry, re-evaluation of patient's condition and review of old charts   Care discussed with: admitting provider      Medications Ordered in the ED  lactated ringers  infusion (has no administration in time range)  lactated ringers  bolus 1,000 mL (0 mLs Intravenous Stopped 01/10/24 1844)    And  lactated ringers  bolus 1,000 mL (0 mLs Intravenous Stopped 01/10/24 1925)    And  lactated ringers  bolus 1,000 mL (0 mLs Intravenous Stopped 01/10/24 1956)    And  lactated ringers  bolus 1,000 mL  (1,000 mLs Intravenous New Bag/Given 01/10/24 2006)    And  lactated ringers  bolus 500 mL (500 mLs Intravenous New Bag/Given 01/10/24 2007)  0.9 %  sodium chloride  infusion (has no administration in time range)  norepinephrine  (LEVOPHED ) 4mg  in (0.016 mg/mL) premix infusion (10 mcg/min Intravenous Infusion Verify 01/10/24 1834)  Chlorhexidine  Gluconate Cloth 2 % PADS 6 each (has no administration in time range)  ceFEPIme  (MAXIPIME ) 2 g in sodium chloride  0.9 % 100 mL IVPB (0 g Intravenous Stopped 01/10/24 1847)  metroNIDAZOLE  (FLAGYL ) IVPB 500 mg (0 mg Intravenous Stopped 01/10/24 1919)  vancomycin  (VANCOCIN ) IVPB 1000 mg/200 mL premix (0 mg Intravenous Stopped 01/10/24 1952)  calcium  gluconate 1 g/ 50 mL sodium chloride  IVPB (0 mg Intravenous Stopped 01/10/24 1953)  sodium bicarbonate  injection 100 mEq (100 mEq Intravenous Given 01/10/24 1938)  insulin  aspart (novoLOG ) injection 5 Units (5 Units Intravenous Given 01/10/24 1940)    And  dextrose  50 % solution 50 mL (50 mLs Intravenous Given 01/10/24 1939)    Clinical Course as of 01/10/24 2142  Tue Jan 10, 2024  1824 Patient with multiple cardiovascular risk factors brought in accompanied by son with complaints of altered mental status.  Son reports that he has been slow to respond otherwise oriented.  Patient himself primarily complains of chest pain, shortness of breath and nausea with reclining.  Patient is hypotensive and hypothermic.  Desats with reclining.  Will begin broad workup, antibiotic and IV fluids.  Started Lawyer. [JT]  1832 Starting levo with persistent hypotension [JT]  1832 CBC with Differential(!) Mild leukocytosis of 11.6, hemoglobin down to 10.2 [JT]  1833 Lactic acid, plasma Without elevation [JT]  1833 DG Chest Port 1 View Cardiomegaly without any obvious acute process [JT]  1846 Comprehensive metabolic panel(!!) Potassium is 6.7, creatinine 6.97 with baseline at 3.8, bicarb of 11 with anion gap of 17 [JT]  1851 Critical  care consulted [JT]  1857 Troponin T, High Sensitivity(!!) Elevated to 157 [JT]  1942 Discussed patient with critical care Dr. Sharie, agreed for admission. Recommended central line placement. Treating hyperkalemia with insulin /dextrose , calcium  gluconate [JT]  1944 Discussed patient with Dr. Dolan, agreed with treatment and disposition at this Bryan. Available for consult during ICU stay. [JT]    Clinical Course User Index [JT] Donnajean Lynwood DEL, PA-C                                 Medical Decision Making Amount and/or Complexity of Data Reviewed Labs: ordered. Decision-making details documented in ED Course. Radiology: ordered. Decision-making details documented in ED Course.  Risk OTC drugs. Prescription drug management. Decision regarding hospitalization.   This patient presents to the ED with chief complaint(s) of AMS.  The complaint involves an extensive differential diagnosis and also carries with it a high risk of complications and morbidity.   Pertinent past medical history as listed in HPI  The differential diagnosis includes  UTI, pneumonia, ACS, fluid overload Additional history obtained: Additional history obtained from family Records reviewed Care Everywhere/External Records  Disposition:   Patient admitted to ICU  Social Determinants of Health:   none  This note was dictated with voice recognition software.  Despite best efforts at proofreading, errors may have occurred which can change the documentation meaning.       Final diagnoses:  Septic shock [facility])    ED Discharge Orders     None          Donnajean Lynwood DEL, [place] 01/21/24 1703  "

## 2024-01-10 NOTE — Sepsis Progress Note (Signed)
 Sepsis protocol monitored by eLink

## 2024-01-11 ENCOUNTER — Inpatient Hospital Stay (HOSPITAL_COMMUNITY)

## 2024-01-11 DIAGNOSIS — G4733 Obstructive sleep apnea (adult) (pediatric): Secondary | ICD-10-CM

## 2024-01-11 DIAGNOSIS — E1122 Type 2 diabetes mellitus with diabetic chronic kidney disease: Secondary | ICD-10-CM | POA: Diagnosis not present

## 2024-01-11 DIAGNOSIS — N186 End stage renal disease: Secondary | ICD-10-CM

## 2024-01-11 DIAGNOSIS — I120 Hypertensive chronic kidney disease with stage 5 chronic kidney disease or end stage renal disease: Secondary | ICD-10-CM | POA: Diagnosis not present

## 2024-01-11 DIAGNOSIS — R9431 Abnormal electrocardiogram [ECG] [EKG]: Secondary | ICD-10-CM

## 2024-01-11 DIAGNOSIS — R6521 Severe sepsis with septic shock: Secondary | ICD-10-CM | POA: Diagnosis not present

## 2024-01-11 DIAGNOSIS — A419 Sepsis, unspecified organism: Secondary | ICD-10-CM | POA: Diagnosis not present

## 2024-01-11 DIAGNOSIS — G9341 Metabolic encephalopathy: Secondary | ICD-10-CM | POA: Diagnosis not present

## 2024-01-11 DIAGNOSIS — E872 Acidosis, unspecified: Secondary | ICD-10-CM

## 2024-01-11 DIAGNOSIS — Z992 Dependence on renal dialysis: Secondary | ICD-10-CM

## 2024-01-11 DIAGNOSIS — N179 Acute kidney failure, unspecified: Secondary | ICD-10-CM | POA: Diagnosis not present

## 2024-01-11 LAB — CBC WITH DIFFERENTIAL/PLATELET
Abs Immature Granulocytes: 0.11 K/uL — ABNORMAL HIGH (ref 0.00–0.07)
Basophils Absolute: 0 K/uL (ref 0.0–0.1)
Basophils Relative: 0 %
Eosinophils Absolute: 0 K/uL (ref 0.0–0.5)
Eosinophils Relative: 0 %
HCT: 33 % — ABNORMAL LOW (ref 39.0–52.0)
Hemoglobin: 10.2 g/dL — ABNORMAL LOW (ref 13.0–17.0)
Immature Granulocytes: 1 %
Lymphocytes Relative: 10 %
Lymphs Abs: 1.1 K/uL (ref 0.7–4.0)
MCH: 29.3 pg (ref 26.0–34.0)
MCHC: 30.9 g/dL (ref 30.0–36.0)
MCV: 94.8 fL (ref 80.0–100.0)
Monocytes Absolute: 0.7 K/uL (ref 0.1–1.0)
Monocytes Relative: 7 %
Neutro Abs: 8.6 K/uL — ABNORMAL HIGH (ref 1.7–7.7)
Neutrophils Relative %: 82 %
Platelets: 142 K/uL — ABNORMAL LOW (ref 150–400)
RBC: 3.48 MIL/uL — ABNORMAL LOW (ref 4.22–5.81)
RDW: 14.3 % (ref 11.5–15.5)
WBC: 10.6 K/uL — ABNORMAL HIGH (ref 4.0–10.5)
nRBC: 0 % (ref 0.0–0.2)

## 2024-01-11 LAB — AMYLASE: Amylase: 91 U/L (ref 28–100)

## 2024-01-11 LAB — ECHOCARDIOGRAM COMPLETE
AR max vel: 2.75 cm2
AV Area VTI: 2.79 cm2
AV Area mean vel: 2.66 cm2
AV Mean grad: 5.5 mmHg
AV Peak grad: 10.8 mmHg
Ao pk vel: 1.64 m/s
Area-P 1/2: 3.27 cm2
Height: 71 in
MV M vel: 1.94 m/s
MV Peak grad: 15.1 mmHg
MV VTI: 1.96 cm2
S' Lateral: 4.3 cm
Weight: 4832.48 [oz_av]

## 2024-01-11 LAB — RENAL FUNCTION PANEL
Albumin: 3.4 g/dL — ABNORMAL LOW (ref 3.5–5.0)
Albumin: 3.4 g/dL — ABNORMAL LOW (ref 3.5–5.0)
Anion gap: 13 (ref 5–15)
Anion gap: 9 (ref 5–15)
BUN: 90 mg/dL — ABNORMAL HIGH (ref 8–23)
BUN: 69 mg/dL — ABNORMAL HIGH (ref 8–23)
CO2: 15 mmol/L — ABNORMAL LOW (ref 22–32)
CO2: 21 mmol/L — ABNORMAL LOW (ref 22–32)
Calcium: 8.4 mg/dL — ABNORMAL LOW (ref 8.9–10.3)
Calcium: 8.4 mg/dL — ABNORMAL LOW (ref 8.9–10.3)
Chloride: 106 mmol/L (ref 98–111)
Chloride: 104 mmol/L (ref 98–111)
Creatinine, Ser: 5.82 mg/dL — ABNORMAL HIGH (ref 0.61–1.24)
Creatinine, Ser: 4.43 mg/dL — ABNORMAL HIGH (ref 0.61–1.24)
GFR, Estimated: 10 mL/min — ABNORMAL LOW
GFR, Estimated: 14 mL/min — ABNORMAL LOW
Glucose, Bld: 95 mg/dL (ref 70–99)
Glucose, Bld: 145 mg/dL — ABNORMAL HIGH (ref 70–99)
Phosphorus: 5.4 mg/dL — ABNORMAL HIGH (ref 2.5–4.6)
Phosphorus: 4 mg/dL (ref 2.5–4.6)
Potassium: 5.6 mmol/L — ABNORMAL HIGH (ref 3.5–5.1)
Potassium: 4.5 mmol/L (ref 3.5–5.1)
Sodium: 133 mmol/L — ABNORMAL LOW (ref 135–145)
Sodium: 135 mmol/L (ref 135–145)

## 2024-01-11 LAB — CG4 I-STAT (LACTIC ACID): Lactic Acid, Venous: 0.3 mmol/L — ABNORMAL LOW (ref 0.5–1.9)

## 2024-01-11 LAB — BASIC METABOLIC PANEL WITH GFR
Anion gap: 13 (ref 5–15)
BUN: 90 mg/dL — ABNORMAL HIGH (ref 8–23)
CO2: 14 mmol/L — ABNORMAL LOW (ref 22–32)
Calcium: 8.2 mg/dL — ABNORMAL LOW (ref 8.9–10.3)
Chloride: 107 mmol/L (ref 98–111)
Creatinine, Ser: 5.8 mg/dL — ABNORMAL HIGH (ref 0.61–1.24)
GFR, Estimated: 10 mL/min — ABNORMAL LOW
Glucose, Bld: 95 mg/dL (ref 70–99)
Potassium: 5.7 mmol/L — ABNORMAL HIGH (ref 3.5–5.1)
Sodium: 134 mmol/L — ABNORMAL LOW (ref 135–145)

## 2024-01-11 LAB — HIV ANTIBODY (ROUTINE TESTING W REFLEX): HIV Screen 4th Generation wRfx: NONREACTIVE

## 2024-01-11 LAB — MAGNESIUM: Magnesium: 2.1 mg/dL (ref 1.7–2.4)

## 2024-01-11 LAB — URINALYSIS, W/ REFLEX TO CULTURE (INFECTION SUSPECTED)
Bilirubin Urine: NEGATIVE
Glucose, UA: NEGATIVE mg/dL
Ketones, ur: NEGATIVE mg/dL
Nitrite: NEGATIVE
Protein, ur: 30 mg/dL — AB
RBC / HPF: >50 RBC/hpf (ref 0–5)
Specific Gravity, Urine: 1.013 (ref 1.005–1.030)
pH: 5 (ref 5.0–8.0)

## 2024-01-11 LAB — GLUCOSE, CAPILLARY
Glucose-Capillary: 105 mg/dL — ABNORMAL HIGH (ref 70–99)
Glucose-Capillary: 106 mg/dL — ABNORMAL HIGH (ref 70–99)
Glucose-Capillary: 126 mg/dL — ABNORMAL HIGH (ref 70–99)
Glucose-Capillary: 159 mg/dL — ABNORMAL HIGH (ref 70–99)
Glucose-Capillary: 77 mg/dL (ref 70–99)
Glucose-Capillary: 89 mg/dL (ref 70–99)

## 2024-01-11 LAB — CBC
HCT: 30.8 % — ABNORMAL LOW (ref 39.0–52.0)
Hemoglobin: 9.6 g/dL — ABNORMAL LOW (ref 13.0–17.0)
MCH: 28.9 pg (ref 26.0–34.0)
MCHC: 31.2 g/dL (ref 30.0–36.0)
MCV: 92.8 fL (ref 80.0–100.0)
Platelets: 141 K/uL — ABNORMAL LOW (ref 150–400)
RBC: 3.32 MIL/uL — ABNORMAL LOW (ref 4.22–5.81)
RDW: 14.3 % (ref 11.5–15.5)
WBC: 7.2 K/uL (ref 4.0–10.5)
nRBC: 0 % (ref 0.0–0.2)

## 2024-01-11 LAB — POCT I-STAT 7, (LYTES, BLD GAS, ICA,H+H)
Acid-base deficit: 6 mmol/L — ABNORMAL HIGH (ref 0.0–2.0)
Bicarbonate: 19 mmol/L — ABNORMAL LOW (ref 20.0–28.0)
Calcium, Ion: 1.22 mmol/L (ref 1.15–1.40)
HCT: 25 % — ABNORMAL LOW (ref 39.0–52.0)
Hemoglobin: 8.5 g/dL — ABNORMAL LOW (ref 13.0–17.0)
O2 Saturation: 94 %
Patient temperature: 98.6
Potassium: 4.7 mmol/L (ref 3.5–5.1)
Sodium: 135 mmol/L (ref 135–145)
TCO2: 20 mmol/L — ABNORMAL LOW (ref 22–32)
pCO2 arterial: 35.4 mmHg (ref 32–48)
pH, Arterial: 7.336 — ABNORMAL LOW (ref 7.35–7.45)
pO2, Arterial: 74 mmHg — ABNORMAL LOW (ref 83–108)

## 2024-01-11 LAB — COMPREHENSIVE METABOLIC PANEL WITH GFR
ALT: 18 U/L (ref 0–44)
AST: 11 U/L — ABNORMAL LOW (ref 15–41)
Albumin: 3.9 g/dL (ref 3.5–5.0)
Alkaline Phosphatase: 72 U/L (ref 38–126)
Anion gap: 14 (ref 5–15)
BUN: 104 mg/dL — ABNORMAL HIGH (ref 8–23)
CO2: 12 mmol/L — ABNORMAL LOW (ref 22–32)
Calcium: 8.4 mg/dL — ABNORMAL LOW (ref 8.9–10.3)
Chloride: 108 mmol/L (ref 98–111)
Creatinine, Ser: 6.84 mg/dL — ABNORMAL HIGH (ref 0.61–1.24)
GFR, Estimated: 8 mL/min — ABNORMAL LOW
Glucose, Bld: 142 mg/dL — ABNORMAL HIGH (ref 70–99)
Potassium: 6.2 mmol/L — ABNORMAL HIGH (ref 3.5–5.1)
Sodium: 134 mmol/L — ABNORMAL LOW (ref 135–145)
Total Bilirubin: 0.3 mg/dL (ref 0.0–1.2)
Total Protein: 6.2 g/dL — ABNORMAL LOW (ref 6.5–8.1)

## 2024-01-11 LAB — TYPE AND SCREEN: ABO/RH(D): O POS

## 2024-01-11 LAB — PHOSPHORUS
Phosphorus: 6.5 mg/dL — ABNORMAL HIGH (ref 2.5–4.6)
Phosphorus: 5.3 mg/dL — ABNORMAL HIGH (ref 2.5–4.6)

## 2024-01-11 LAB — PRO BRAIN NATRIURETIC PEPTIDE: Pro Brain Natriuretic Peptide: 272 pg/mL

## 2024-01-11 LAB — APTT: aPTT: 41 s — ABNORMAL HIGH (ref 24–36)

## 2024-01-11 LAB — MRSA NEXT GEN BY PCR, NASAL: MRSA by PCR Next Gen: NOT DETECTED

## 2024-01-11 LAB — STREP PNEUMONIAE URINARY ANTIGEN: Strep Pneumo Urinary Antigen: NEGATIVE

## 2024-01-11 LAB — LIPASE, BLOOD: Lipase: 98 U/L — ABNORMAL HIGH (ref 11–51)

## 2024-01-11 MED ORDER — TAMSULOSIN HCL 0.4 MG PO CAPS
0.4000 mg | ORAL_CAPSULE | Freq: Every day | ORAL | Status: DC
  Administered 2024-01-11 – 2024-01-30 (×20): 0.4 mg via ORAL
  Filled 2024-01-11 (×20): qty 1

## 2024-01-11 MED ORDER — ENSURE PLUS HIGH PROTEIN PO LIQD
237.0000 mL | Freq: Two times a day (BID) | ORAL | Status: DC
  Administered 2024-01-12 – 2024-01-18 (×14): 237 mL via ORAL

## 2024-01-11 MED ORDER — SODIUM CHLORIDE 0.9 % IV SOLN
2.0000 g | INTRAVENOUS | Status: AC
  Administered 2024-01-11 – 2024-01-15 (×5): 2 g via INTRAVENOUS
  Filled 2024-01-11 (×5): qty 20

## 2024-01-11 MED ORDER — VASOPRESSIN 20 UNITS/100 ML INFUSION FOR SHOCK: 0.0000 [IU]/min | INTRAVENOUS | Status: DC

## 2024-01-11 MED ORDER — RENA-VITE PO TABS
1.0000 | ORAL_TABLET | Freq: Every day | ORAL | Status: DC
  Administered 2024-01-12 – 2024-01-29 (×18): 1 via ORAL
  Filled 2024-01-11 (×18): qty 1

## 2024-01-11 MED ORDER — HYDROCODONE-ACETAMINOPHEN 5-325 MG PO TABS
1.0000 | ORAL_TABLET | Freq: Four times a day (QID) | ORAL | Status: AC | PRN
  Administered 2024-01-11 – 2024-01-13 (×6): 1 via ORAL
  Filled 2024-01-11 (×6): qty 1

## 2024-01-11 MED ORDER — SENNA 8.6 MG PO TABS: 1.0000 | ORAL_TABLET | Freq: Two times a day (BID) | ORAL | Status: DC | PRN

## 2024-01-11 MED ORDER — THIAMINE MONONITRATE 100 MG PO TABS
100.0000 mg | ORAL_TABLET | Freq: Every day | ORAL | Status: AC
  Administered 2024-01-12 – 2024-01-16 (×5): 100 mg via ORAL
  Filled 2024-01-11 (×5): qty 1

## 2024-01-11 MED ORDER — SODIUM CHLORIDE 0.9 % IV SOLN
100.0000 mg | Freq: Two times a day (BID) | INTRAVENOUS | Status: AC
  Administered 2024-01-11 – 2024-01-15 (×9): 100 mg via INTRAVENOUS
  Filled 2024-01-11 (×10): qty 100

## 2024-01-11 MED ORDER — POLYETHYLENE GLYCOL 3350 17 G PO PACK: 17.0000 g | PACK | Freq: Every day | ORAL | Status: DC | PRN

## 2024-01-11 MED ORDER — ZOLPIDEM TARTRATE 5 MG PO TABS
5.0000 mg | ORAL_TABLET | Freq: Every evening | ORAL | Status: DC | PRN
  Administered 2024-01-11: 5 mg via ORAL
  Filled 2024-01-11: qty 1

## 2024-01-11 NOTE — Progress Notes (Signed)
 ABG drawn r rad with pt on BIPAP and room air.  pH 7.19  CO2 41.5  pO2  84 HCO3 15.8

## 2024-01-11 NOTE — Progress Notes (Signed)
 eLink Physician-Brief Progress Note Patient Name: Peter Bryan DOB: [birth year] MRN: [id number]   Date of Service  01/11/2024  HPI/Events of Note  ABG reviewed, metabolic acidosis being slowly corrected with CRRT with Bicarb.  eICU Interventions  Continue CRRT and monitor for progressive correction of metabolic acidosis.        Ariahna Smiddy U Yamina Lenis 01/11/2024, 6:26 AM

## 2024-01-11 NOTE — Progress Notes (Addendum)
 "  NAME:  Peter Bryan, MRN:  [id number], DOB:  [birth year], LOS: 1 ADMISSION DATE:  01/10/2024, CONSULTATION DATE: 01/10/2024 REFERRING MD: ED team, CHIEF COMPLAINT: Altered mental status  History of Present Illness:  64 year old male with history of HTN, stage IV CKD with recurrent hyperkalemia, DM2, OSA, morbid obesity, multiple back surgeries, chronic pain syndrome, presents to the [facility] ED, brought by the son for altered mental status.  Also complaining of constipation at baseline from prior surgeries, and has not urinated for the last 24 hours.  He is nonambulatory at baseline and on disability from prior surgeries.  Patient reports nausea, chest pain, shortness of breath and urine retention.  Denies any cough, or upper respiratory symptoms At the ED was found to be lethargic, AOX3, protecting airway on nasal cannula, in shock requiring levo, and severe metabolic acidosis bicarb 11, anion gap of 17, and hyperkalemia of 6.7.  Creatinine 6.9, with GFR of 8. EKG did not show any signs of acute ischemia.  Tropes was 152 ED team is initiating temporizing measures for hyperkalemia and severe metabolic acidosis, placing a central line, patient will be transferred to [facility].   Pertinent  Medical History  HTN, CKD stage IV, recurrent hyperkalemia, DM2, OSA, morbid obesity, multiple back surgeries  Significant Hospital Events: Including procedures, antibiotic start and stop dates in addition to other pertinent events   Admission to [facility], ICU 01/10/2023   Interim History / Subjective:  Weaned off the vent, now on room air. Weaning NE 10 mcg >>2 mcg..   Objective    Blood pressure 120/61, pulse 79, temperature 98.4 F (36.9 C), resp. rate (!) 23, height 5' 11 (1.803 m), weight (!) 137 kg, SpO2 95%.        Intake/Output Summary (Last 24 hours) at 01/11/2024 0948 Last data filed at 01/11/2024 0900 Gross per 24 hour  Intake 6856.82 ml  Output 2494.6 ml  Net 4362.22 ml   Filed  Weights   01/10/24 2214 01/11/24 0155  Weight: (!) 137 kg (!) 137 kg    Labs: WBC 11.6 >> 7.2 BMP - K5.6, CO2 11 >>15  Micro: MRSA PCR negative RVP negative BCx negative@24   Examination: General: Critically ill, lying in bed HENT: NCAT Lungs: Medical chest wall movement, clear to auscultation Cardiovascular: S1-S2 normal, no murmur Abdomen: Soft nontender nondistended Extremities: Warm, minimal edema Neuro: AO X3, lethargic, moves all extremities GU: Deferred  Resolved problem list   Assessment and Plan   64 year old male with history of HTN, CKD stage IV, recurrent hyperkalemia, DM2, presents from OSH with altered mental status, shortness of breath and urinary retention, found to be in shock & severe metabolic acidosis  Acute metabolic encephalopathy Septic shock, improving Severe metabolic acidosis High anion gap metabolic acidosis AKI on CKD 4 DM 2 BPH  Plan - Improving overall, now on room air. - Weaning pressors as tolerated. - VBG with appropriate pH bicarb much improved - Nephrology team on board, on CRRT.  Hyperkalemia resolved - Strict I&O's, Foley - Resume home tamsulosin . - Antibiotics de-escalated to ceftriaxone  for possible PNA.  Labs   CBC: Recent Labs  Lab 01/10/24 1758 01/10/24 2314 01/11/24 0541  WBC 11.6* 10.6* 7.2  NEUTROABS 9.9* 8.6*  --   HGB 10.2* 10.2* 9.6*  HCT 33.8* 33.0* 30.8*  MCV 94.7 94.8 92.8  PLT 149* 142* 141*    Basic Metabolic Panel: Recent Labs  Lab 01/10/24 1758 01/10/24 2314 01/11/24 0541  NA 133* 134* 133*  134*  K 6.7* 6.2* 5.6*  5.7*  CL 105 108 106  107  CO2 11* 12* 15*  14*  GLUCOSE 136* 142* 95  95  BUN 109* 104* 90*  90*  CREATININE 6.97* 6.84* 5.82*  5.80*  CALCIUM  8.6* 8.4* 8.4*  8.2*  MG  --   --  2.1  PHOS  --  6.5* 5.4*  5.3*   GFR: Estimated Creatinine Clearance: 18 mL/min (A) (by C-G formula based on SCr of 5.8 mg/dL (H)). Recent Labs  Lab 01/10/24 1753 01/10/24 1758  01/10/24 2024 01/10/24 2314 01/11/24 0514 01/11/24 0541  WBC  --  11.6*  --  10.6*  --  7.2  LATICACIDVEN 0.5  --  0.8  --  0.3*  --     Liver Function Tests: Recent Labs  Lab 01/10/24 1758 01/10/24 2314 01/11/24 0541  AST 11* 11*  --   ALT 21 18  --   ALKPHOS 79 72  --   BILITOT 0.3 0.3  --   PROT 7.0 6.2*  --   ALBUMIN 4.4 3.9 3.4*   Recent Labs  Lab 01/10/24 2314  LIPASE 98*  AMYLASE 91   No results for input(s): AMMONIA in the last 168 hours.  ABG No results found for: PHART, PCO2ART, PO2ART, HCO3, TCO2, ACIDBASEDEF, O2SAT   Coagulation Profile: Recent Labs  Lab 01/10/24 1758 01/10/24 2314  INR 1.1 1.2    Cardiac Enzymes: No results for input(s): CKTOTAL, CKMB, CKMBINDEX, TROPONINI in the last 168 hours.  HbA1C: Hgb A1c MFr Bld  Date/Time Value Ref Range Status  03/07/2019 01:19 PM 6.8 (H) 4.8 - 5.6 % Final    Comment:    (NOTE) Pre diabetes:          5.7%-6.4% Diabetes:              >6.4% Glycemic control for   <7.0% adults with diabetes     CBG: Recent Labs  Lab 01/10/24 2036 01/10/24 2209 01/10/24 2341 01/11/24 0354 01/11/24 0757  GLUCAP 163* 153* 121* 106* 89     Home Medications  Prior to Admission medications  Medication Sig Start Date End Date Taking? Authorizing Provider  aspirin  EC 81 MG tablet Take 1 tablet (81 mg total) by mouth daily. Swallow whole. 01/21/22   Lonni Slain, MD  baclofen (LIORESAL) 10 MG tablet Take 10 mg by mouth daily as needed for muscle spasms.    [provider]  gabapentin (NEURONTIN) 300 MG capsule Take 300 mg by mouth 4 (four) times daily. 07/22/08   [provider]  glipiZIDE (GLUCOTROL XL) 2.5 MG 24 hr tablet Take 2.5 mg by mouth daily with breakfast. 12/31/19   [provider]  hydrALAZINE (APRESOLINE) 25 MG tablet Take 25 mg by mouth 2 (two) times daily. 09/04/21   [provider]  HYDROcodone -acetaminophen  (NORCO) 10-325 MG per  tablet Take 2-2.5 tablets by mouth See admin instructions. Take 2.5 mg in the morning and at lunch as 2 tables in the evening    [provider]  JANUVIA 50 MG tablet Take 1 tablet by mouth daily. 04/15/16   [provider]  JARDIANCE  10 MG TABS tablet TAKE 1 TABLET DAILY BEFORE BREAKFAST 07/29/23   Lonni Slain, MD  LOKELMA  10 g PACK packet Take 10 g by mouth 3 (three) times a week.    [provider]  magnesium oxide (MAG-OX) 400 MG tablet Take 400 mg by mouth daily. 09/26/20   [provider]  Peppermint Oil (IBGARD) 90 MG CPCR Take by mouth. 10/08/21   [provider]  rosuvastatin (CRESTOR) 5 MG tablet Take 5 mg by mouth daily.      [provider]  sodium bicarbonate  650 MG tablet Take 650 mg by mouth daily. 09/26/20   [provider]  testosterone  cypionate (DEPOTESTOSTERONE CYPIONATE) 200 MG/ML injection Inject 200 mg into the muscle once a week. 02/24/19   [provider]  thiamine  50 MG tablet Take 50 mg by mouth 2 (two) times daily.    [provider]  TRULICITY 0.75 MG/0.5ML SOPN Inject 0.75 mg into the skin. Patient not taking: Reported on 09/03/2022 01/18/22   [provider]  Turmeric (QC TUMERIC COMPLEX PO) Take 4,500 mg by mouth daily at 12 noon.    [provider]  valsartan-hydrochlorothiazide (DIOVAN-HCT) 320-25 MG tablet Take 1 tablet by mouth at bedtime. Midnight    [provider]  Vitamin D, Ergocalciferol, (DRISDOL) 1.25 MG (50000 UNIT) CAPS capsule Take 50,000 Units by mouth 2 (two) times a week.    [provider]  zolpidem  (AMBIEN ) 10 MG tablet Take 10 mg by mouth at bedtime.  01/31/19   [provider]     Critical care time: 40 mins    Missy Sandhoff, M.D.  Internal Medicine Resident, PGY-2  9:48 AM, 01/11/2024   "

## 2024-01-11 NOTE — Consult Note (Signed)
 [facility]  HISTORY AND PHYSICAL  [facility] is an 64 y.o. male.    Chief Complaint: weakness/ AMS  HPI: Pt is a 45M with a PMH sig for Htn, DM II, CKD IV (baseline Cr 3.11), multiple back surgeries who is seen for AKI/ AMS/ hyperkalemia/ septic shock.  Was brought to med center [facility] for weakness/ AMS and found to have septic shock and severe hyperkalemia of 6.7 as well as metabolic acidosis.  On pressors.  Brought to [facility] ICU where CRRT initiated.  This AM being weaned from pressors, making some urine, getting alittle better.  On broad-spectrum ABX.  In this setting we are asked to see.  Sons at bedside.  Pt reports no pain.     PMH: Past Medical History:  Diagnosis Date   Chronic kidney disease    Diabetes mellitus without complication (HCC)    Hypertension    Neuromuscular disorder (HCC)    neuropathy feet   Obesity    Sleep apnea    PSH: Past Surgical History:  Procedure Laterality Date   BACK SURGERY  1/92-08/2016   multiple levels. C5-S1   CARPAL TUNNEL RELEASE     CERVICAL SPINE SURGERY     ROTATOR CUFF REPAIR     SHOULDER ARTHROSCOPY WITH OPEN ROTATOR CUFF REPAIR AND DISTAL CLAVICLE ACROMINECTOMY Right 03/16/2019   Procedure: SHOULDER ARTHROSCOPY WITH OPEN ROTATOR CUFF REPAIR AND DISTAL CLAVICLE ACROMINECTOMY;  Surgeon: Shari Sieving, MD;  Location: [facility];  Service: Orthopedics;  Laterality: Right;    Past Medical History:  Diagnosis Date   Chronic kidney disease    Diabetes mellitus without complication (HCC)    Hypertension    Neuromuscular disorder (HCC)    neuropathy feet   Obesity    Sleep apnea     Medications:  Scheduled:  Chlorhexidine  Gluconate Cloth  6 each Topical Daily   heparin   5,000 Units Subcutaneous Q8H   heparin  sodium (porcine)  1,000 Units Intravenous Once    Medications Prior to Admission  Medication Sig Dispense Refill   aspirin  EC 81 MG tablet Take 1 tablet (81 mg total) by mouth daily. Swallow whole. 90  tablet 3   baclofen (LIORESAL) 10 MG tablet Take 10 mg by mouth daily as needed for muscle spasms.     gabapentin (NEURONTIN) 300 MG capsule Take 300 mg by mouth 4 (four) times daily.     glipiZIDE (GLUCOTROL XL) 2.5 MG 24 hr tablet Take 2.5 mg by mouth daily with breakfast.     hydrALAZINE (APRESOLINE) 25 MG tablet Take 25 mg by mouth 2 (two) times daily.     HYDROcodone -acetaminophen  (NORCO) 10-325 MG per tablet Take 2-2.5 tablets by mouth See admin instructions. Take 2.5 mg in the morning and at lunch as 2 tables in the evening     JANUVIA 50 MG tablet Take 1 tablet by mouth daily.     JARDIANCE  10 MG TABS tablet TAKE 1 TABLET DAILY BEFORE BREAKFAST 90 tablet 3   LOKELMA  10 g PACK packet Take 10 g by mouth 3 (three) times a week.     magnesium oxide (MAG-OX) 400 MG tablet Take 400 mg by mouth daily.     Peppermint Oil (IBGARD) 90 MG CPCR Take by mouth.     rosuvastatin (CRESTOR) 5 MG tablet Take 5 mg by mouth daily.       sodium bicarbonate  650 MG tablet Take 650 mg by mouth daily.     testosterone  cypionate (DEPOTESTOSTERONE CYPIONATE) 200 MG/ML  injection Inject 200 mg into the muscle once a week.     thiamine  50 MG tablet Take 50 mg by mouth 2 (two) times daily.     TRULICITY 0.75 MG/0.5ML SOPN Inject 0.75 mg into the skin. (Patient not taking: Reported on 09/03/2022)     Turmeric (QC TUMERIC COMPLEX PO) Take 4,500 mg by mouth daily at 12 noon.     valsartan-hydrochlorothiazide (DIOVAN-HCT) 320-25 MG tablet Take 1 tablet by mouth at bedtime. Midnight     Vitamin D, Ergocalciferol, (DRISDOL) 1.25 MG (50000 UNIT) CAPS capsule Take 50,000 Units by mouth 2 (two) times a week.     zolpidem  (AMBIEN ) 10 MG tablet Take 10 mg by mouth at bedtime.       ALLERGIES:  Allergies[1]  FAM HX: History reviewed. No pertinent family history.  Social History:   reports that he has never smoked. He has never used smokeless tobacco. He reports that he does not drink alcohol and does not use  drugs.  ROS: ROS: all other systems reviewed and are negative except as per hPI  Blood pressure 114/65, pulse 74, temperature 98.6 F (37 C), resp. rate 16, height 5' 11 (1.803 m), weight (!) 137 kg, SpO2 96%. PHYSICAL EXAM: Physical Exam GEN appears ill HEENT eyes closed NECK + nontunneled HD cath PULM clear CV tachy ABD soft, mildly distended EXT 2+ LE edema NEURO  AAO x 3 but does repeat  himself a little   Results for orders placed or performed during the hospital encounter of 01/10/24 (from the past 48 hours)  CBG monitoring, ED     Status: Abnormal   Collection Time: 01/10/24  5:45 PM  Result Value Ref Range   Glucose-Capillary 113 (H) 70 - 99 mg/dL    Comment: Glucose reference range applies only to samples taken after fasting for at least 8 hours.   Comment 1 Notify RN   Resp panel by RT-PCR (RSV, Flu A&B, Covid) Anterior Nasal Swab     Status: None   Collection Time: 01/10/24  5:53 PM   Specimen: Anterior Nasal Swab  Result Value Ref Range   SARS Coronavirus 2 by RT PCR NEGATIVE NEGATIVE    Comment: (NOTE) SARS-CoV-2 target nucleic acids are NOT DETECTED.  The SARS-CoV-2 RNA is generally detectable in upper respiratory specimens during the acute phase of infection. The lowest concentration of SARS-CoV-2 viral copies this assay can detect is 138 copies/mL. A negative result does not preclude SARS-Cov-2 infection and should not be used as the sole basis for treatment or other patient management decisions. A negative result may occur with  improper specimen collection/handling, submission of specimen other than nasopharyngeal swab, presence of viral mutation(s) within the areas targeted by this assay, and inadequate number of viral copies(<138 copies/mL). A negative result must be combined with clinical observations, patient history, and epidemiological information. The expected result is Negative.  Fact Sheet for Patients:   bloggercourse.com  Fact Sheet for Healthcare Providers:  seriousbroker.it  This test is no t yet approved or cleared by the [place]  FDA and  has been authorized for detection and/or diagnosis of SARS-CoV-2 by FDA under an Emergency Use Authorization (EUA). This EUA will remain  in effect (meaning this test can be used) for the duration of the COVID-19 declaration under Section 564(b)(1) of the Act, 21 U.S.C.section 360bbb-3(b)(1), unless the authorization is terminated  or revoked sooner.       Influenza A by PCR NEGATIVE NEGATIVE   Influenza B by PCR  NEGATIVE NEGATIVE    Comment: (NOTE) The Xpert Xpress SARS-CoV-2/FLU/RSV plus assay is intended as an aid in the diagnosis of influenza from Nasopharyngeal swab specimens and should not be used as a sole basis for treatment. Nasal washings and aspirates are unacceptable for Xpert Xpress SARS-CoV-2/FLU/RSV testing.  Fact Sheet for Patients: bloggercourse.com  Fact Sheet for Healthcare Providers: seriousbroker.it  This test is not yet approved or cleared by the [place]  FDA and has been authorized for detection and/or diagnosis of SARS-CoV-2 by FDA under an Emergency Use Authorization (EUA). This EUA will remain in effect (meaning this test can be used) for the duration of the COVID-19 declaration under Section 564(b)(1) of the Act, 21 U.S.C. section 360bbb-3(b)(1), unless the authorization is terminated or revoked.     Resp Syncytial Virus by PCR NEGATIVE NEGATIVE    Comment: (NOTE) Fact Sheet for Patients: bloggercourse.com  Fact Sheet for Healthcare Providers: seriousbroker.it  This test is not yet approved or cleared by the [place]  FDA and has been authorized for detection and/or diagnosis of SARS-CoV-2 by FDA under an Emergency Use Authorization (EUA).  This EUA will remain in effect (meaning this test can be used) for the duration of the COVID-19 declaration under Section 564(b)(1) of the Act, 21 U.S.C. section 360bbb-3(b)(1), unless the authorization is terminated or revoked.  Performed at [facility], [address] [place], [place] [address]   Lactic acid, plasma     Status: None   Collection Time: 01/10/24  5:53 PM  Result Value Ref Range   Lactic Acid, Venous 0.5 0.5 - 1.9 mmol/L    Comment: Performed at [facility], [address] [place], [place] [address]  Blood Culture (routine x 2)     Status: None (Preliminary result)   Collection Time: 01/10/24  5:53 PM   Specimen: BLOOD  Result Value Ref Range   Specimen Description      BLOOD LEFT ANTECUBITAL Performed at [facility], [address] [place], [place] [address]    Special Requests      BOTTLES DRAWN AEROBIC AND ANAEROBIC Blood Culture adequate volume Performed at [facility], [address] [place], [place] [address]    Culture      NO GROWTH < 12 HOURS Performed at [facility], [address], [place], [place] [address]    Report Status PENDING   Troponin T, High Sensitivity     Status: Abnormal   Collection Time: 01/10/24  5:55 PM  Result Value Ref Range   Troponin T High Sensitivity 157 (HH) 0 - 19 ng/L    Comment: Critical Value, Read Back and verified with VALORIE P., RN 1853 AD (NOTE) Biotin concentrations > 1000 ng/mL falsely decrease TnT results.  Serial cardiac troponin measurements are suggested.  Refer to the Links section for chest pain algorithms and additional  guidance. Performed at [facility], [address] [place], [place] [address]   Pro Brain natriuretic peptide     Status: None   Collection Time: 01/10/24  5:55 PM  Result Value Ref Range   Pro Brain Natriuretic Peptide 255.0 <300.0 pg/mL    Comment: (NOTE) Age Group        Cut-Points    Interpretation  < 50  years     450 pg/mL       NT-proBNP > 450 pg/mL indicates  ADHF is likely              50 to 75 years  900 pg/mL      NT-proBNP > 900 pg/mL indicates          ADHF is likely  > 75 years      1800 pg/mL     NT-proBNP > 1800 pg/mL indicates          ADHF is likely                           All ages    Results between       Indeterminate. Further clinical             300 and the cut-   information is needed to determine            point for age group   if ADHF is present.                                                             Elecsys proBNP II/ Elecsys proBNP II STAT           Cut-Point                       Interpretation  300 pg/mL                    NT-proBNP <300pg/mL indicates                             ADHF is not likely  Performed at [facility], [address] [place], [place] [address]   Comprehensive metabolic panel     Status: Abnormal   Collection Time: 01/10/24  5:58 PM  Result Value Ref Range   Sodium 133 (L) 135 - 145 mmol/L   Potassium 6.7 (HH) 3.5 - 5.1 mmol/L    Comment: Critical Value, Read Back and verified with LORANE SAILOR., RN 1841 AD   Chloride 105 98 - 111 mmol/L   CO2 11 (L) 22 - 32 mmol/L   Glucose, Bld 136 (H) 70 - 99 mg/dL    Comment: Glucose reference range applies only to samples taken after fasting for at least 8 hours.   BUN 109 (H) 8 - 23 mg/dL   Creatinine, Ser 3.02 (H) 0.61 - 1.24 mg/dL   Calcium  8.6 (L) 8.9 - 10.3 mg/dL   Total Protein 7.0 6.5 - 8.1 g/dL   Albumin 4.4 3.5 - 5.0 g/dL   AST 11 (L) 15 - 41 U/L   ALT 21 0 - 44 U/L   Alkaline Phosphatase 79 38 - 126 U/L   Total Bilirubin 0.3 0.0 - 1.2 mg/dL   GFR, Estimated 8 (L) >60 mL/min    Comment: (NOTE) Calculated using the CKD-EPI Creatinine Equation (2021)    Anion gap 17 (H) 5 - 15    Comment: Performed at [facility], [address] [place], [place] [address]  CBC with Differential     Status: Abnormal   Collection  Time: 01/10/24  5:58 PM  Result Value Ref Range   WBC 11.6 (H) 4.0 - 10.5  K/uL   RBC 3.57 (L) 4.22 - 5.81 MIL/uL   Hemoglobin 10.2 (L) 13.0 - 17.0 g/dL   HCT 66.1 (L) 60.9 - 47.9 %   MCV 94.7 80.0 - 100.0 fL   MCH 28.6 26.0 - 34.0 pg   MCHC 30.2 30.0 - 36.0 g/dL   RDW 85.6 88.4 - 84.4 %   Platelets 149 (L) 150 - 400 K/uL   nRBC 0.0 0.0 - 0.2 %   Neutrophils Relative % 86 %   Neutro Abs 9.9 (H) 1.7 - 7.7 K/uL   Lymphocytes Relative 8 %   Lymphs Abs 0.9 0.7 - 4.0 K/uL   Monocytes Relative 5 %   Monocytes Absolute 0.6 0.1 - 1.0 K/uL   Eosinophils Relative 0 %   Eosinophils Absolute 0.0 0.0 - 0.5 K/uL   Basophils Relative 0 %   Basophils Absolute 0.0 0.0 - 0.1 K/uL   Immature Granulocytes 1 %   Abs Immature Granulocytes 0.08 (H) 0.00 - 0.07 K/uL    Comment: Performed at [facility], [address] [place], [place] [address]  Protime-INR     Status: None   Collection Time: 01/10/24  5:58 PM  Result Value Ref Range   Prothrombin Time 15.1 11.4 - 15.2 seconds   INR 1.1 0.8 - 1.2    Comment: (NOTE) INR goal varies based on device and disease states. Performed at [facility], [address] [place], [place] [address]   Acetaminophen  level     Status: Abnormal   Collection Time: 01/10/24  5:59 PM  Result Value Ref Range   Acetaminophen  (Tylenol ), Serum <10 (L) 10 - 30 ug/mL    Comment: (NOTE) Toxic concentrations can be more effectively related to post dose interval; >200, >100, and >50 ug/mL serum concentrations correspond to toxic concentrations at 4, 8, and 12 hours post dose, respectively.  Performed at [facility], [address] [place], [place] [address]   Salicylate level     Status: Abnormal   Collection Time: 01/10/24  5:59 PM  Result Value Ref Range   Salicylate Lvl <7.0 (L) 7.0 - 30.0 mg/dL    Comment: Performed at [facility], [address] [place], [place] [address]  Ethanol     Status: None   Collection  Time: 01/10/24  6:03 PM  Result Value Ref Range   Alcohol, Ethyl (B) <15 <15 mg/dL    Comment: (NOTE) For medical purposes only. Performed at [facility], [address] [place], [place] [address]   Blood Culture (routine x 2)     Status: None (Preliminary result)   Collection Time: 01/10/24  6:13 PM   Specimen: BLOOD  Result Value Ref Range   Specimen Description      BLOOD RIGHT ANTECUBITAL Performed at [facility], [address] [place], [place] [address]    Special Requests      BOTTLES DRAWN AEROBIC AND ANAEROBIC Blood Culture adequate volume Performed at [facility], [address] [place], [place] [address]    Culture      NO GROWTH < 12 HOURS Performed at [facility], [address], [place], [place] [address]    Report Status PENDING   Urinalysis, w/ Reflex to Culture (Infection Suspected) -Urine, Clean Catch     Status: Abnormal   Collection Time: 01/10/24  6:22 PM  Result Value Ref Range  Specimen Source URINE, CLEAN CATCH    Color, Urine YELLOW YELLOW   APPearance CLEAR CLEAR   Specific Gravity, Urine 1.025 1.005 - 1.030   pH 5.0 5.0 - 8.0   Glucose, UA NEGATIVE NEGATIVE mg/dL   Hgb urine dipstick NEGATIVE NEGATIVE   Bilirubin Urine NEGATIVE NEGATIVE   Ketones, ur NEGATIVE NEGATIVE mg/dL   Protein, ur 899 (A) NEGATIVE mg/dL   Nitrite NEGATIVE NEGATIVE   Leukocytes,Ua SMALL (A) NEGATIVE   Squamous Epithelial / HPF 0-5 0 - 5 /HPF   WBC, UA 6-10 0 - 5 WBC/hpf    Comment: Reflex urine culture not performed if WBC <=10, OR if Squamous epithelial cells >5. If Squamous epithelial cells >5, suggest recollection.   RBC / HPF NONE SEEN 0 - 5 RBC/hpf   Bacteria, UA FEW (A) NONE SEEN    Comment: Performed at [facility], [address] [place], [place] [address]  Urine Drug Screen     Status: Abnormal   Collection Time: 01/10/24  6:22 PM  Result Value Ref Range   Opiates POSITIVE (A) NEGATIVE   Cocaine  NEGATIVE NEGATIVE   Benzodiazepines NEGATIVE NEGATIVE   Amphetamines NEGATIVE NEGATIVE   Tetrahydrocannabinol NEGATIVE NEGATIVE   Barbiturates NEGATIVE NEGATIVE   Methadone Scn, Ur NEGATIVE NEGATIVE   Fentanyl  NEGATIVE NEGATIVE    Comment: (NOTE) Drug screen is for Medical Purposes only. Positive results are preliminary only. If confirmation is needed, notify lab within 5 days.  Drug Class                 Cutoff (ng/mL) Amphetamine and metabolites 1000 Barbiturate and metabolites 200 Benzodiazepine              200 Opiates and metabolites     300 Cocaine and metabolites     300 THC                         50 Fentanyl                     5 Methadone                   300  Trazodone is metabolized in vivo to several metabolites,  including pharmacologically active m-CPP, which is excreted in the  urine.  Immunoassay screens for amphetamines and MDMA have potential  cross-reactivity with these compounds and may provide false positive  result.  Performed at [facility], [address] [place], [place] [address]   Lactic acid, plasma     Status: None   Collection Time: 01/10/24  8:24 PM  Result Value Ref Range   Lactic Acid, Venous 0.8 0.5 - 1.9 mmol/L    Comment: Performed at [facility], [address] [place], [place] [address]  Troponin T, High Sensitivity     Status: Abnormal   Collection Time: 01/10/24  8:24 PM  Result Value Ref Range   Troponin T High Sensitivity 132 (HH) 0 - 19 ng/L    Comment: Critical value noted. Value is consistent with previously reported and called value  (NOTE) Biotin concentrations > 1000 ng/mL falsely decrease TnT results.  Serial cardiac troponin measurements are suggested.  Refer to the Links section for chest pain algorithms and additional  guidance. Performed at [facility], [address] [place], [place] [address]   CBG monitoring, ED     Status: Abnormal  Collection Time: 01/10/24  8:36 PM   Result Value Ref Range   Glucose-Capillary 163 (H) 70 - 99 mg/dL    Comment: Glucose reference range applies only to samples taken after fasting for at least 8 hours.  MRSA Next Gen by PCR, Nasal     Status: None   Collection Time: 01/10/24 10:09 PM   Specimen: Nasal Mucosa; Nasal Swab  Result Value Ref Range   MRSA by PCR Next Gen NOT DETECTED NOT DETECTED    Comment: (NOTE) The GeneXpert MRSA Assay (FDA approved for NASAL specimens only), is one component of a comprehensive MRSA colonization surveillance program. It is not intended to diagnose MRSA infection nor to guide or monitor treatment for MRSA infections. Test performance is not FDA approved in patients less than 57 years old. Performed at [facility], [address] [place], [place] [address]   Glucose, capillary     Status: Abnormal   Collection Time: 01/10/24 10:09 PM  Result Value Ref Range   Glucose-Capillary 153 (H) 70 - 99 mg/dL    Comment: Glucose reference range applies only to samples taken after fasting for at least 8 hours.  Type and screen If need to transfuse blood products please use the blood administration order set     Status: None   Collection Time: 01/10/24 11:13 PM  Result Value Ref Range   ABO/RH(D) O POS    Antibody Screen NEG    Sample Expiration      01/13/2024,2359 Performed at [facility], [address], [place], [place] [address]   HIV Antibody (routine testing w rflx)     Status: None   Collection Time: 01/10/24 11:14 PM  Result Value Ref Range   HIV Screen 4th Generation wRfx Non Reactive Non Reactive    Comment: Performed at [facility], [address], [place], [place] [address]  Comprehensive metabolic panel     Status: Abnormal   Collection Time: 01/10/24 11:14 PM  Result Value Ref Range   Sodium 134 (L) 135 - 145 mmol/L   Potassium 6.2 (H) 3.5 - 5.1 mmol/L   Chloride 108 98 - 111 mmol/L   CO2 12 (L) 22 - 32 mmol/L   Glucose, Bld 142 (H) 70 - 99 mg/dL     Comment: Glucose reference range applies only to samples taken after fasting for at least 8 hours.   BUN 104 (H) 8 - 23 mg/dL   Creatinine, Ser 3.15 (H) 0.61 - 1.24 mg/dL   Calcium  8.4 (L) 8.9 - 10.3 mg/dL   Total Protein 6.2 (L) 6.5 - 8.1 g/dL   Albumin 3.9 3.5 - 5.0 g/dL   AST 11 (L) 15 - 41 U/L   ALT 18 0 - 44 U/L   Alkaline Phosphatase 72 38 - 126 U/L   Total Bilirubin 0.3 0.0 - 1.2 mg/dL   GFR, Estimated 8 (L) >60 mL/min    Comment: (NOTE) Calculated using the CKD-EPI Creatinine Equation (2021)    Anion gap 14 5 - 15    Comment: Performed at [facility], [address] [place], [place] [address]  Phosphorus     Status: Abnormal   Collection Time: 01/10/24 11:14 PM  Result Value Ref Range   Phosphorus 6.5 (H) 2.5 - 4.6 mg/dL    Comment: Performed at [facility], [address], [place], [place] [address]  Lipase, blood     Status: Abnormal   Collection Time: 01/10/24 11:14  PM  Result Value Ref Range   Lipase 98 (H) 11 - 51 U/L    Comment: Performed at [facility], [address], [place], [place] [address]  Amylase     Status: None   Collection Time: 01/10/24 11:14 PM  Result Value Ref Range   Amylase 91 28 - 100 U/L    Comment: Performed at [facility], [address], [place], [place] [address]  Pro Brain natriuretic peptide     Status: None   Collection Time: 01/10/24 11:14 PM  Result Value Ref Range   Pro Brain Natriuretic Peptide 272.0 <300.0 pg/mL    Comment: (NOTE) Age Group        Cut-Points    Interpretation  < 50 years     450 pg/mL       NT-proBNP > 450 pg/mL indicates                                ADHF is likely              50 to 75 years  900 pg/mL      NT-proBNP > 900 pg/mL indicates          ADHF is likely  > 75 years      1800 pg/mL     NT-proBNP > 1800 pg/mL indicates          ADHF is likely                           All ages    Results between       Indeterminate. Further clinical             300 and the cut-    information is needed to determine            point for age group   if ADHF is present.                                                             Elecsys proBNP II/ Elecsys proBNP II STAT           Cut-Point                       Interpretation  300 pg/mL                    NT-proBNP <300pg/mL indicates                             ADHF is not likely  Performed at [facility], [address] [place], [place] [address]   CBC with Differential/Platelet     Status: Abnormal   Collection Time: 01/10/24 11:14 PM  Result Value Ref Range   WBC 10.6 (H) 4.0 - 10.5 K/uL   RBC 3.48 (L) 4.22 - 5.81 MIL/uL   Hemoglobin 10.2 (L) 13.0 - 17.0 g/dL   HCT 66.9 (L) 60.9 - 47.9 %   MCV 94.8 80.0 - 100.0 fL   MCH 29.3 26.0 - 34.0 pg   MCHC 30.9 30.0 - 36.0 g/dL  RDW 14.3 11.5 - 15.5 %   Platelets 142 (L) 150 - 400 K/uL   nRBC 0.0 0.0 - 0.2 %   Neutrophils Relative % 82 %   Neutro Abs 8.6 (H) 1.7 - 7.7 K/uL   Lymphocytes Relative 10 %   Lymphs Abs 1.1 0.7 - 4.0 K/uL   Monocytes Relative 7 %   Monocytes Absolute 0.7 0.1 - 1.0 K/uL   Eosinophils Relative 0 %   Eosinophils Absolute 0.0 0.0 - 0.5 K/uL   Basophils Relative 0 %   Basophils Absolute 0.0 0.0 - 0.1 K/uL   Immature Granulocytes 1 %   Abs Immature Granulocytes 0.11 (H) 0.00 - 0.07 K/uL    Comment: Performed at [facility], [address] [place], [place] [address]  Protime-INR     Status: Abnormal   Collection Time: 01/10/24 11:14 PM  Result Value Ref Range   Prothrombin Time 15.4 (H) 11.4 - 15.2 seconds   INR 1.2 0.8 - 1.2    Comment: (NOTE) INR goal varies based on device and disease states. Performed at [facility], [address], [place], [place] [address]   APTT     Status: Abnormal   Collection Time: 01/10/24 11:14 PM  Result Value Ref Range   aPTT 43 (H) 24 - 36 seconds    Comment:        IF BASELINE aPTT IS ELEVATED, SUGGEST PATIENT RISK ASSESSMENT BE USED TO DETERMINE  APPROPRIATE ANTICOAGULANT THERAPY. Performed at [facility], [address] [place], [place] [address]   D-dimer, quantitative     Status: Abnormal   Collection Time: 01/10/24 11:14 PM  Result Value Ref Range   D-Dimer, Quant 0.97 (H) 0.00 - 0.50 ug/mL-FEU    Comment: (NOTE) At the manufacturer cut-off value of 0.5 g/mL FEU, this assay has a negative predictive value of 95-100%.This assay is intended for use in conjunction with a clinical pretest probability (PTP) assessment model to exclude pulmonary embolism (PE) and deep venous thrombosis (DVT) in outpatients suspected of PE or DVT. Results should be correlated with clinical presentation. Performed at [facility], [address], [place], [place] [address]   Glucose, capillary     Status: Abnormal   Collection Time: 01/10/24 11:41 PM  Result Value Ref Range   Glucose-Capillary 121 (H) 70 - 99 mg/dL    Comment: Glucose reference range applies only to samples taken after fasting for at least 8 hours.  Urinalysis, w/ Reflex to Culture (Infection Suspected) -Urine, Catheterized     Status: Abnormal   Collection Time: 01/10/24 11:58 PM  Result Value Ref Range   Specimen Source URINE, CATHETERIZED    Color, Urine YELLOW YELLOW   APPearance CLOUDY (A) CLEAR   Specific Gravity, Urine 1.013 1.005 - 1.030   pH 5.0 5.0 - 8.0   Glucose, UA NEGATIVE NEGATIVE mg/dL   Hgb urine dipstick LARGE (A) NEGATIVE   Bilirubin Urine NEGATIVE NEGATIVE   Ketones, ur NEGATIVE NEGATIVE mg/dL   Protein, ur 30 (A) NEGATIVE mg/dL   Nitrite NEGATIVE NEGATIVE   Leukocytes,Ua LARGE (A) NEGATIVE   RBC / HPF >50 0 - 5 RBC/hpf   WBC, UA 21-50 0 - 5 WBC/hpf    Comment:        Reflex urine culture not performed if WBC <=10, OR if Squamous epithelial cells >5. If Squamous epithelial cells >5 suggest recollection.    Bacteria, UA RARE (A) NONE SEEN   Squamous Epithelial /  HPF 0-5 0 - 5 /HPF   Mucus PRESENT     Comment: Performed at [facility], [address], [place], [place] [address]  Strep pneumoniae urinary antigen (not at [facility])     Status: None   Collection Time: 01/10/24 11:58 PM  Result Value Ref Range   Strep Pneumo Urinary Antigen NEGATIVE NEGATIVE    Comment:        Infection due to S. pneumoniae cannot be absolutely ruled out since the antigen present may be below the detection limit of the test. Performed at [facility], [address] [place], [place] [address]   Glucose, capillary     Status: Abnormal   Collection Time: 01/11/24  3:54 AM  Result Value Ref Range   Glucose-Capillary 106 (H) 70 - 99 mg/dL    Comment: Glucose reference range applies only to samples taken after fasting for at least 8 hours.  CG4 I-STAT (Lactic acid)     Status: Abnormal   Collection Time: 01/11/24  5:14 AM  Result Value Ref Range   Lactic Acid, Venous 0.3 (L) 0.5 - 1.9 mmol/L  CBC     Status: Abnormal   Collection Time: 01/11/24  5:41 AM  Result Value Ref Range   WBC 7.2 4.0 - 10.5 K/uL   RBC 3.32 (L) 4.22 - 5.81 MIL/uL   Hemoglobin 9.6 (L) 13.0 - 17.0 g/dL   HCT 69.1 (L) 60.9 - 47.9 %   MCV 92.8 80.0 - 100.0 fL   MCH 28.9 26.0 - 34.0 pg   MCHC 31.2 30.0 - 36.0 g/dL   RDW 85.6 88.4 - 84.4 %   Platelets 141 (L) 150 - 400 K/uL   nRBC 0.0 0.0 - 0.2 %    Comment: Performed at [facility], [address] [place], [place] [address]  Basic metabolic panel     Status: Abnormal   Collection Time: 01/11/24  5:41 AM  Result Value Ref Range   Sodium 134 (L) 135 - 145 mmol/L   Potassium 5.7 (H) 3.5 - 5.1 mmol/L    Comment: HEMOLYSIS AT THIS LEVEL MAY AFFECT RESULT   Chloride 107 98 - 111 mmol/L   CO2 14 (L) 22 - 32 mmol/L   Glucose, Bld 95 70 - 99 mg/dL    Comment: Glucose reference range applies only to samples taken after fasting for at least 8 hours.   BUN 90 (H) 8 - 23 mg/dL   Creatinine, Ser 4.19 (H) 0.61 - 1.24 mg/dL   Calcium  8.2 (L) 8.9 - 10.3 mg/dL   GFR, Estimated 10 (L) >60 mL/min     Comment: (NOTE) Calculated using the CKD-EPI Creatinine Equation (2021)    Anion gap 13 5 - 15    Comment: Performed at [facility], [address] [place], [place] [address]  Magnesium     Status: None   Collection Time: 01/11/24  5:41 AM  Result Value Ref Range   Magnesium 2.1 1.7 - 2.4 mg/dL    Comment: Performed at [facility], [address], [place], [place] [address]  Phosphorus     Status: Abnormal   Collection Time: 01/11/24  5:41 AM  Result Value Ref Range   Phosphorus 5.3 (H) 2.5 - 4.6 mg/dL    Comment: Performed at [facility], [address] [place], [place] [address]  Renal function panel (daily at 0500)     Status: Abnormal   Collection Time:  01/11/24  5:41 AM  Result Value Ref Range   Sodium 133 (L) 135 - 145 mmol/L   Potassium 5.6 (H) 3.5 - 5.1 mmol/L    Comment: HEMOLYSIS AT THIS LEVEL MAY AFFECT RESULT   Chloride 106 98 - 111 mmol/L   CO2 15 (L) 22 - 32 mmol/L   Glucose, Bld 95 70 - 99 mg/dL    Comment: Glucose reference range applies only to samples taken after fasting for at least 8 hours.   BUN 90 (H) 8 - 23 mg/dL   Creatinine, Ser 4.17 (H) 0.61 - 1.24 mg/dL   Calcium  8.4 (L) 8.9 - 10.3 mg/dL   Phosphorus 5.4 (H) 2.5 - 4.6 mg/dL   Albumin 3.4 (L) 3.5 - 5.0 g/dL   GFR, Estimated 10 (L) >60 mL/min    Comment: (NOTE) Calculated using the CKD-EPI Creatinine Equation (2021)    Anion gap 13 5 - 15    Comment: Performed at [facility], [address], [place], [place] [address]  APTT     Status: Abnormal   Collection Time: 01/11/24  5:41 AM  Result Value Ref Range   aPTT 41 (H) 24 - 36 seconds    Comment:        IF BASELINE aPTT IS ELEVATED, SUGGEST PATIENT RISK ASSESSMENT BE USED TO DETERMINE APPROPRIATE ANTICOAGULANT THERAPY. Performed at [facility], [address], [place], [place] [address]   Glucose, capillary     Status: None   Collection Time: 01/11/24  7:57 AM  Result Value Ref Range   Glucose-Capillary 89  70 - 99 mg/dL    Comment: Glucose reference range applies only to samples taken after fasting for at least 8 hours.    DG CHEST PORT 1 VIEW Result Date: 01/10/2024 EXAM: 1 VIEW(S) XRAY OF THE CHEST 01/10/2024 11:05:00 AM COMPARISON: None available. CLINICAL HISTORY: Encounter for central line placement. FINDINGS: LINES, TUBES AND DEVICES: Right central line placement with the tip in the SVC. LUNGS AND PLEURA: No focal pulmonary opacity. No pleural effusion. No pneumothorax. Elevation of the right hemidiaphragm, stable. HEART AND MEDIASTINUM: Cardiomegaly. Vascular congestion. BONES AND SOFT TISSUES: No acute osseous abnormality. IMPRESSION: 1. Right central line placement with the tip in the SVC, without pneumothorax. 2. Cardiomegaly and vascular congestion. Electronically signed by: Franky Crease MD 01/10/2024 11:19 PM EST RP Workstation: [id number]   CT CHEST ABDOMEN PELVIS WO CONTRAST Result Date: 01/10/2024 EXAM: CT CHEST, ABDOMEN AND PELVIS WITHOUT CONTRAST 01/10/2024 09:01:58 PM TECHNIQUE: CT of the chest, abdomen and pelvis was performed without the administration of intravenous contrast. Multiplanar reformatted images are provided for review. Automated exposure control, iterative reconstruction, and/or weight based adjustment of the mA/kV was utilized to reduce the radiation dose to as low as reasonably achievable. COMPARISON: 10/03/2021 CLINICAL HISTORY: Sepsis FINDINGS: CHEST: MEDIASTINUM AND LYMPH NODES: Coronary artery aortic atherosclerosis. Heart and pericardium are unremarkable. The central airways are clear. No mediastinal, hilar or axillary lymphadenopathy. LUNGS AND PLEURA: Bibasilar dependent atelectasis. More confluent opacity in the left lower lobe could reflect early pneumonia. No pleural effusion. No pneumothorax. ABDOMEN AND PELVIS: LIVER: Unremarkable. GALLBLADDER AND BILE DUCTS: Unremarkable. No biliary ductal dilatation. SPLEEN: No acute abnormality. PANCREAS: No acute abnormality.  ADRENAL GLANDS: No acute abnormality. KIDNEYS, URETERS AND BLADDER: 5.7 cm simple appearing cyst off the upper pole of the right kidney. Per consensus, no follow-up is needed for simple Bosniak type 1 and 2 renal cysts, unless the patient has a malignancy history or risk factors.  Foley catheter in the bladder, which is decompressed. No stones in the kidneys or ureters. No hydronephrosis. No perinephric or periureteral stranding. GI AND BOWEL: Stomach demonstrates no acute abnormality. There is no bowel obstruction. REPRODUCTIVE ORGANS: No acute abnormality. PERITONEUM AND RETROPERITONEUM: No ascites. No free air. VASCULATURE: Aorta is normal in caliber. Aortic atherosclerosis. ABDOMINAL AND PELVIS LYMPH NODES: No lymphadenopathy. BONES AND SOFT TISSUES: Postoperative changes in the spine. Advanced degenerative changes throughout the spine. No acute bony abnormality. No focal soft tissue abnormality. IMPRESSION: 1. Bibasilar dependent atelectasis. Oopacity in the left lower lobe, possibly representing early pneumonia. 2. Coronary artery disease, aortic atherosclerosis. Electronically signed by: Franky Crease MD 01/10/2024 09:10 PM EST RP Workstation: [id number]   DG Chest Port 1 View Result Date: 01/10/2024 EXAM: 1 VIEW(S) XRAY OF THE CHEST 01/10/2024 06:05:00 PM COMPARISON: Prior study dated 12/17/2020. CLINICAL HISTORY: Questionable sepsis - evaluate for abnormality FINDINGS: LUNGS AND PLEURA: Shallow inspiration. Linear atelectasis in the lung bases. No pleural effusion. No pneumothorax. HEART AND MEDIASTINUM: Cardiac enlargement. Mediastinal contours appear intact. BONES AND SOFT TISSUES: Postoperative changes in the cervical and thoracic spine. IMPRESSION: 1. No acute cardiopulmonary abnormality. 2. Cardiomegaly. Electronically signed by: Elsie Gravely MD 01/10/2024 06:17 PM EST RP Workstation: [id number]    Assessment/Plan  AKI on CKD IV:  - baseline 3.11 as of 10/2023 - 2/2 hemodynamic insults in  setting of septic shock - on CRRT - hyperkalemia and acidosis improving - continue current - may consider d/c tomorrow if can wean off pressors  2.  Septic shock:   - broad spectrum abx and pressors via PCCM  - looks like possible pna LLL  3.  Severe hyperkalemia:  - received temporizing measures  - correcting via CRRT  4.  DM II  - per primary  5.  Dispo: pending  Matej Sappenfield 01/11/2024, 11:03 AM       [1] No Known Allergies

## 2024-01-11 NOTE — Plan of Care (Signed)

## 2024-01-11 NOTE — Progress Notes (Signed)
 eLink Physician-Brief Progress Note Patient Name: Peter Bryan DOB: 05/29/1960 MRN: [id number]   Date of Service  01/11/2024  HPI/Events of Note  Patient needs a diet order and PRN medication for pain.  eICU Interventions  Diet ordered + PRN Norco for pain.        Darcee Dekker U Brogan Martis 01/11/2024, 2:58 AM

## 2024-01-11 NOTE — Progress Notes (Addendum)
 Initial Nutrition Assessment  DOCUMENTATION CODES:   Not applicable  INTERVENTION:  Liberalize diet while on CRRT d/t continuous blood cleaning; fluid restriction remains in place  Add Ensure Plus High Protein po BID, each supplement provides 350 kcal and 20 grams of protein   Monitor magnesium, potassium, and phosphorus daily for at least 3 days, MD to replete as needed, as pt is at risk for refeeding syndrome given poor intake PTA.   Add Thiamine  100 mg daily for 5 days   Add renal MVI   Needs new A1c drawn  Monitor bowel frequency and need for scheduled bowel regimen as this can impact lab trend  NUTRITION DIAGNOSIS:  Increased nutrient needs related to acute illness as evidenced by estimated needs.  GOAL:  Patient will meet greater than or equal to 90% of their needs  MONITOR:  PO intake, Supplement acceptance, Labs, I & O's  REASON FOR ASSESSMENT:  Consult Assessment of nutrition requirement/status (CRRT)  ASSESSMENT:   Pt with PMH significant for: T2DM, CKD, morbid obesity, chronic pain syndrome who presented with AMS. Admitted for septic shock, hyperkalemia, and metabolic acidosis.  1/06 - admitted 1/07 - CRRT initiated  Patient started on CRRT. Attempting to wean off pressors.   24 Hour Recall B: 2 pieces of toast w/ butter or jam and water  L: protein, starch, veg (largest meal of the day around 2PM) D: whatever we can find - small snack, but cannot endorse typical foods consumed  Met with patient and his son at bedside. Consumed all of his burger for lunch w/ a cup of peaches and 1/2 of his juice. Reported a sharp decline in appetite prior to admission. Essentially only eating one meal per day. He is refeeding risk because of this, which may be delayed in the setting of kidney dysfunction. Will add thiamine .   He reports doing the grocery shopping and cooking for his wife and son, with whom he resides, but has not been doing that as much lately. He is  sendentary at baseline due to his chronic pain syndrome 2/2 multiple back surgeries.   He reports being concerned about this and had reported to his PCP that he was not losing any weight. Endorses feeling fluid overloaded in his belly. Noted with edema to lower extremities. No issues chewing or swallowing reported.   Discussed the need for adequate protein/calories in the setting of continuous renal replacement therapy. Patient had questions about fluid restriction. Discussed reasoning with patient and liberalized diet indication.   Given his poor appetite PTA and on continuous renal replacement therapy, will liberalize diet and monitor trends. Can reinstate renal diet once CRRT is discontinued.   Admit/Current Weight: 137 kg  Endorses UBW around 285lbs, and was down to 280lbs one week ago when he visited his doctor last Monday (02/02/2023). He is approximately 20lbs above this currently.  No recent weights in chart to perform chart review. No BM since admission. Reports taking Linzess at baseline to move bowels, however they have been loose since starting this therapy approximately one year ago. Monitor need for scheduled bowel regimen as this can impact lab trends.  Drains/Lines: R internal jugular: hemodialysis catheter  Foley catheter UOP: 1910 ml x24 hours  Meds: IV ABX  Labs: Na+ 135 (wdl) K+ 6.7--->4.7(wdl) BUN 90 Crt 5.82 PHOS 5.4 (H) CBGs 95-142 x24 hours A1c needs updating  NUTRITION - FOCUSED PHYSICAL EXAM: Patient does not currently meet criteria for malnutrition, however volume status may be masking additional muscle/fat depletions. Recommend  repeat NFPE when edema resolves/fluid removed to assess for malnutrition as his intake PTA was suboptimal.   Flowsheet Row Most Recent Value  Orbital Region No depletion  Upper Arm Region No depletion  Thoracic and Lumbar Region No depletion  Buccal Region No depletion  Temple Region Mild depletion  Clavicle Bone Region No  depletion  Clavicle and Acromion Bone Region No depletion  Scapular Bone Region No depletion  Dorsal Hand No depletion  Patellar Region Unable to assess  Anterior Thigh Region No depletion  Posterior Calf Region Unable to assess  Edema (RD Assessment) Mild  Hair Reviewed  Eyes Reviewed  Mouth Reviewed  Skin Reviewed  Nails Reviewed    Diet Order:   Diet Order             Diet renal/carb modified with fluid restriction Fluid restriction: 1200 mL Fluid; Room service appropriate? Yes; Fluid consistency: Thin  Diet effective now             EDUCATION NEEDS:  Education needs have been addressed  Skin:  Skin Assessment: Reviewed RN Assessment  Last BM:  PTA  Height:  Ht Readings from Last 1 Encounters:  01/10/24 5' 11 (1.803 m)   Weight:  Wt Readings from Last 1 Encounters:  01/11/24 (!) 137 kg   Ideal Body Weight:  78.2 kg  BMI:  Body mass index is 42.12 kg/m.  Estimated Nutritional Needs:   Kcal:  2000-2200 kcals  Protein:  100-115g  Fluid:  1L+UOP  Blair Deaner MS, RD, LDN Registered Dietitian I Clinical Nutrition RD Inpatient Contact Info in Amion

## 2024-01-12 DIAGNOSIS — N179 Acute kidney failure, unspecified: Secondary | ICD-10-CM | POA: Diagnosis not present

## 2024-01-12 DIAGNOSIS — E1122 Type 2 diabetes mellitus with diabetic chronic kidney disease: Secondary | ICD-10-CM | POA: Diagnosis not present

## 2024-01-12 DIAGNOSIS — N400 Enlarged prostate without lower urinary tract symptoms: Secondary | ICD-10-CM

## 2024-01-12 DIAGNOSIS — I120 Hypertensive chronic kidney disease with stage 5 chronic kidney disease or end stage renal disease: Secondary | ICD-10-CM | POA: Diagnosis not present

## 2024-01-12 DIAGNOSIS — N184 Chronic kidney disease, stage 4 (severe): Secondary | ICD-10-CM | POA: Diagnosis not present

## 2024-01-12 LAB — APTT: aPTT: 55 s — ABNORMAL HIGH (ref 24–36)

## 2024-01-12 LAB — RENAL FUNCTION PANEL
Albumin: 3.4 g/dL — ABNORMAL LOW (ref 3.5–5.0)
Anion gap: 7 (ref 5–15)
BUN: 55 mg/dL — ABNORMAL HIGH (ref 8–23)
CO2: 27 mmol/L (ref 22–32)
Calcium: 8.7 mg/dL — ABNORMAL LOW (ref 8.9–10.3)
Chloride: 103 mmol/L (ref 98–111)
Creatinine, Ser: 3.18 mg/dL — ABNORMAL HIGH (ref 0.61–1.24)
GFR, Estimated: 21 mL/min — ABNORMAL LOW
Glucose, Bld: 78 mg/dL (ref 70–99)
Phosphorus: 3.1 mg/dL (ref 2.5–4.6)
Potassium: 4.2 mmol/L (ref 3.5–5.1)
Sodium: 138 mmol/L (ref 135–145)

## 2024-01-12 LAB — URINE CULTURE: Culture: NO GROWTH

## 2024-01-12 LAB — HEMOGLOBIN A1C
Hgb A1c MFr Bld: 5.4 % (ref 4.8–5.6)
Mean Plasma Glucose: 108.28 mg/dL

## 2024-01-12 LAB — MAGNESIUM: Magnesium: 1.8 mg/dL (ref 1.7–2.4)

## 2024-01-12 LAB — GLUCOSE, CAPILLARY
Glucose-Capillary: 91 mg/dL (ref 70–99)
Glucose-Capillary: 124 mg/dL — ABNORMAL HIGH (ref 70–99)

## 2024-01-12 LAB — URIC ACID: Uric Acid, Serum: 3.6 mg/dL — ABNORMAL LOW (ref 3.7–8.6)

## 2024-01-12 MED ORDER — ZOLPIDEM TARTRATE 5 MG PO TABS
10.0000 mg | ORAL_TABLET | Freq: Every evening | ORAL | Status: DC | PRN
  Administered 2024-01-12 – 2024-01-29 (×18): 10 mg via ORAL
  Filled 2024-01-12 (×19): qty 2

## 2024-01-12 MED ORDER — SENNA 8.6 MG PO TABS
1.0000 | ORAL_TABLET | Freq: Two times a day (BID) | ORAL | Status: DC
  Administered 2024-01-12 – 2024-01-30 (×25): 8.6 mg via ORAL
  Filled 2024-01-12 (×31): qty 1

## 2024-01-12 MED ORDER — ORAL CARE MOUTH RINSE
15.0000 mL | OROMUCOSAL | Status: DC
  Administered 2024-01-12 – 2024-01-19 (×30): 15 mL via OROMUCOSAL

## 2024-01-12 MED ORDER — ZOLPIDEM TARTRATE 5 MG PO TABS: 15.0000 mg | ORAL_TABLET | Freq: Every evening | ORAL | Status: DC | PRN

## 2024-01-12 MED ORDER — LINACLOTIDE 145 MCG PO CAPS
290.0000 ug | ORAL_CAPSULE | Freq: Every day | ORAL | Status: DC
  Administered 2024-01-18 – 2024-01-30 (×11): 290 ug via ORAL
  Filled 2024-01-12 (×19): qty 2

## 2024-01-12 MED ORDER — GABAPENTIN 100 MG PO CAPS
100.0000 mg | ORAL_CAPSULE | Freq: Four times a day (QID) | ORAL | Status: DC
  Administered 2024-01-12 – 2024-01-30 (×72): 100 mg via ORAL
  Filled 2024-01-12 (×72): qty 1

## 2024-01-12 MED ORDER — ACETAMINOPHEN 325 MG PO TABS
650.0000 mg | ORAL_TABLET | Freq: Four times a day (QID) | ORAL | Status: DC | PRN
  Administered 2024-01-12 – 2024-01-29 (×3): 650 mg via ORAL
  Filled 2024-01-12 (×5): qty 2

## 2024-01-12 MED ORDER — POLYETHYLENE GLYCOL 3350 17 G PO PACK
17.0000 g | PACK | Freq: Every day | ORAL | Status: DC
  Administered 2024-01-12 – 2024-01-27 (×8): 17 g via ORAL
  Filled 2024-01-12 (×16): qty 1

## 2024-01-12 MED ORDER — ORAL CARE MOUTH RINSE: 15.0000 mL | OROMUCOSAL | Status: DC | PRN

## 2024-01-12 NOTE — Evaluation (Signed)
 Physical Therapy Evaluation Patient Details Name: Peter Bryan MRN: [id number] DOB: April 21, 1960 Today's Date: 01/12/2024  History of Present Illness  64 yo M adm 1/6 for weakness and AMS with workup for septic shock and severe hyperkalemia. AKI on CKD IV. CRRT d/c 1/8. PMH sig for Htn, DM II, CKD IV (baseline Cr 3.11), multiple back surgeries.  Clinical Impression  Pt admitted with above diagnosis. Eager to participate with therapy, feels LEs are very heavy and still edematous limiting mobility. Required Min assist +2 for bed mobility, mod assist +2 to stand several times from bed and BSC, and Mod-Min assist +2 for step pivot transfer and pre-gait training using RW for support. Pain in bil feet with weight bearing. Great family support at home. Mod I with rollator at home, using electric scooter for community mobility. Patient will benefit from intensive inpatient follow-up therapy, >3 hours/day.  Pt currently with functional limitations due to the deficits listed below (see PT Problem List). Pt will benefit from acute skilled PT to increase their independence and safety with mobility to allow discharge.           If plan is discharge home, recommend the following: A lot of help with walking and/or transfers;A lot of help with bathing/dressing/bathroom;Assistance with cooking/housework;Assist for transportation;Help with stairs or ramp for entrance   Can travel by private vehicle        Equipment Recommendations None recommended by PT  Recommendations for Other Services  Rehab consult    Functional Status Assessment Patient has had a recent decline in their functional status and demonstrates the ability to make significant improvements in function in a reasonable and predictable amount of time.     Precautions / Restrictions Precautions Precautions: Fall Recall of Precautions/Restrictions: Impaired Restrictions Weight Bearing Restrictions Per Provider Order: No      Mobility  Bed  Mobility Overal bed mobility: Needs Assistance Bed Mobility: Supine to Sit     Supine to sit: +2 for physical assistance, +2 for safety/equipment, HOB elevated, Used rails, Min assist     General bed mobility comments: Min assist +2 for trunk support to rise to EOB using rail with cues for technique. Moves LEs very slowly, pt reports feeling heavy.    Transfers Overall transfer level: Needs assistance Equipment used: Rolling Bouza (2 wheels) Transfers: Sit to/from Stand, Bed to chair/wheelchair/BSC Sit to Stand: +2 physical assistance, +2 safety/equipment, From elevated surface, Mod assist   Step pivot transfers: Mod assist, Min assist, From elevated surface       General transfer comment: Mod assist +2 for boost to stand from bed x2 and BSC. Cues for set-up, technique, and hand placement. Did improve by 3rd trial. Mod assist +2 for balance, weight shift and Brayboy control with pivot during his first 2 transfers and min assist +2 on 3rd transfer from bed to transport chair.    Ambulation/Gait             Pre-gait activities: Static weight shift, forward and retro steps with RW and mod assist for balance.    Stairs            Wheelchair Mobility     Tilt Bed    Modified Rankin (Stroke Patients Only)       Balance Overall balance assessment: Needs assistance Sitting-balance support: No upper extremity supported, Feet supported Sitting balance-Leahy Scale: Fair Sitting balance - Comments: CGA EOB, mod I seated in BSC.   Standing balance support: Bilateral upper extremity supported, Reliant on  assistive device for balance, During functional activity Standing balance-Leahy Scale: Poor                               Pertinent Vitals/Pain Pain Assessment Pain Assessment: Faces Faces Pain Scale: Hurts little more Pain Location: bil feet with weight-bearing Pain Descriptors / Indicators: Aching Pain Intervention(s): Limited activity within  patient's tolerance, Monitored during session, Repositioned    Home Living Family/patient expects to be discharged to:: Private residence Living Arrangements: Children Available Help at Discharge: Family;Available 24 hours/day Type of Home: House Home Access: Stairs to enter Entrance Stairs-Rails: None Entrance Stairs-Number of Steps: 1   Home Layout: One level Home Equipment: Rollator (4 wheels);Electric scooter;Grab bars - tub/shower;Grab bars - toilet;Lift chair      Prior Function Prior Level of Function : Independent/Modified Independent;Driving             Mobility Comments: ind no falls, uses rollator indoors, scooter outdoors ADLs Comments: ind, stands to shower     Extremity/Trunk Assessment   Upper Extremity Assessment Upper Extremity Assessment: Defer to OT evaluation    Lower Extremity Assessment Lower Extremity Assessment: Generalized weakness (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 Successful BM on BSC. Required assist with peri-care from OT while PT assisted with balance and RW support.    Exercises General Exercises - Lower Extremity Ankle Circles/Pumps: AROM, Both, 10 reps, Supine   Assessment/Plan    PT Assessment Patient needs continued PT services  PT Problem List Decreased strength;Decreased range of motion;Decreased activity tolerance;Decreased balance;Decreased mobility;Decreased coordination;Decreased knowledge of use  of DME;Obesity;Pain       PT Treatment Interventions DME instruction;Gait training;Stair training;Functional mobility training;Therapeutic exercise;Therapeutic activities;Balance training;Neuromuscular re-education;Patient/family education    PT Goals (Current goals can be found in the Care Plan section)  Acute Rehab PT Goals Patient Stated Goal: get well, return home PT Goal Formulation: With patient/family Time For Goal Achievement: 01/26/24 Potential to Achieve Goals: Good    Frequency Min 3X/week     Co-evaluation PT/OT/SLP Co-Evaluation/Treatment: Yes Reason for Co-Treatment: Complexity of the patient's impairments (multi-system involvement);For patient/therapist safety;To address functional/ADL transfers PT goals addressed during session: Mobility/safety with mobility;Balance;Proper use of DME         AM-PAC PT 6 Clicks Mobility  Outcome Measure Help needed turning from your back to your side while in a flat bed without using bedrails?: A Little Help needed moving from lying on your back to sitting on the side of a flat bed without using bedrails?: A Lot Help needed moving to and from a bed to a chair (including a wheelchair)?: A Lot Help needed standing up from a chair using your arms (e.g., wheelchair or bedside chair)?: A Lot Help needed to walk in hospital room?: A Lot Help needed climbing 3-5 steps with a railing? : Total  6 Click Score: 12    End of Session Equipment Utilized During Treatment: Gait belt Activity Tolerance: Patient tolerated treatment well Patient left:  Theme Park Manager with nursing staff taking to new unit) Nurse Communication: Mobility status PT Visit Diagnosis: Unsteadiness on feet (R26.81);Other abnormalities of gait and mobility (R26.89);Muscle weakness (generalized) (M62.81);History of falling (Z91.81);Difficulty in walking, not elsewhere classified (R26.2);Other symptoms and signs involving the nervous system (R29.898);Pain Pain - part of  body:  (feet)    Time: 8570-8486 PT Time Calculation (min) (ACUTE ONLY): 44 min   Charges:   PT Evaluation $PT Eval Moderate Complexity: 1 Mod PT Treatments $Therapeutic Activity: 8-22 mins PT General Charges $$ ACUTE PT VISIT: 1 Visit         Leontine Roads, PT, DPT [facility]  Rehabilitation Services Physical Therapist Office: [phone] Website: [email or web address]   Leontine GORMAN Roads 01/12/2024, 3:47 PM

## 2024-01-12 NOTE — Evaluation (Signed)
 Occupational Therapy Evaluation Patient Details Name: Peter Bryan MRN: [id number] DOB: 08-15-1960 Today's Date: 01/12/2024   History of Present Illness   64 yo M adm 1/6 for weakness and AMS with workup for septic shock and severe hyperkalemia. AKI on CKD IV. CRRT d/c 1/8. PMH sig for Htn, DM II, CKD IV (baseline Cr 3.11), multiple back surgeries.     Clinical Impressions At baseline, pt is Independent with ADLs and IADLs, performs functional mobility Independent to Mod I with Rollator or scooter, and drives. Pt now presents with decreased activity tolerance, decreased balance, generalized B UE weakness, decreased B UE coordination, decreased cognition, and decreased safety and independence with functional tasks. Pt currently demonstrating ability to complete ADLs with largely Set up to Total assist +2, bed mobility with Min assist +2, and functional transfers with use of a RW with Min to Mod assist +2. Pt VSS on RA. Pt participated well in session, is motivated to return to PLOF, and has good family support. Pt will benefit from acute OT services to address deficits and increase safety and independence with functional tasks. Post acute discharge, pt will benefit from intensive inpatient skilled rehab services > 3 hours per day to maximize rehab potential, decrease risk of falls, and decrease risk of rehospitalization.      If plan is discharge home, recommend the following:   Two people to help with walking and/or transfers;Two people to help with bathing/dressing/bathroom;Assistance with cooking/housework;Direct supervision/assist for medications management;Direct supervision/assist for financial management;Assist for transportation;Help with stairs or ramp for entrance     Functional Status Assessment   Patient has had a recent decline in their functional status and demonstrates the ability to make significant improvements in function in a reasonable and predictable amount of time.      Equipment Recommendations   BSC/3in1;Tub/shower seat (other TBD based on pt progress)     Recommendations for Other Services   Rehab consult     Precautions/Restrictions   Precautions Precautions: Fall Recall of Precautions/Restrictions: Impaired Restrictions Weight Bearing Restrictions Per Provider Order: No     Mobility Bed Mobility Overal bed mobility: Needs Assistance Bed Mobility: Supine to Sit     Supine to sit: +2 for physical assistance, +2 for safety/equipment, HOB elevated, Used rails, Min assist     General bed mobility comments: Min assist +2 for trunk support to rise to EOB using rail with cues for technique. Moves LEs very slowly, pt reports feeling heavy.    Transfers Overall transfer level: Needs assistance Equipment used: Rolling [place] (2 wheels) Transfers: Sit to/from Stand, Bed to chair/wheelchair/BSC Sit to Stand: +2 physical assistance, +2 safety/equipment, From elevated surface, Mod assist     Step pivot transfers: Mod assist, Min assist, From elevated surface, +2 physical assistance, +2 safety/equipment     General transfer comment: Mod assist +2 for boost to stand from bed x2 and BSC. Cues for set-up, technique, and hand placement. Did improve by 3rd trial. Mod assist +2 for balance, weight shift and Carne control with pivot during his first 2 transfers and min assist +2 on 3rd transfer from bed to transport chair.      Balance Overall balance assessment: Needs assistance Sitting-balance support: No upper extremity supported, Feet supported Sitting balance-Leahy Scale: Fair Sitting balance - Comments: CGA EOB, mod I seated in BSC.   Standing balance support: Bilateral upper extremity supported, Reliant on assistive device for balance, During functional activity Standing balance-Leahy Scale: Poor  ADL either performed or assessed with clinical judgement   ADL Overall ADL's : Needs  assistance/impaired Eating/Feeding: Set up;Sitting   Grooming: Contact guard assist;Set up;Cueing for compensatory techniques;Sitting   Upper Body Bathing: Moderate assistance;Maximal assistance;Sitting;Cueing for compensatory techniques   Lower Body Bathing: Total assistance;+2 for physical assistance;+2 for safety/equipment;Sit to/from stand   Upper Body Dressing : Moderate assistance;Sitting;Cueing for compensatory techniques   Lower Body Dressing: Total assistance;+2 for physical assistance;+2 for safety/equipment;Sit to/from stand   Toilet Transfer: Minimal assistance;Moderate assistance;+2 for physical assistance;+2 for safety/equipment;BSC/3in1;Rolling [place] (2 wheels);Cueing for sequencing;Cueing for safety (step-pivot transfer)   Toileting- Clothing Manipulation and Hygiene: Total assistance;+2 for physical assistance;+2 for safety/equipment;Sit to/from stand         General ADL Comments: Pt with decreased activity tolerance, fatiguing quickly during tasks     Vision Patient Visual Report: No change from baseline Additional Comments: [facility] for tasks assessed; not formally screened or evaluated     Perception         Praxis         Pertinent Vitals/Pain Pain Assessment Pain Assessment: Faces Faces Pain Scale: Hurts little more Pain Location: bil feet with weight-bearing Pain Descriptors / Indicators: Aching Pain Intervention(s): Limited activity within patient's tolerance, Monitored during session, Repositioned     Extremity/Trunk Assessment Upper Extremity Assessment Upper Extremity Assessment: Right hand dominant;Generalized weakness;RUE deficits/detail;LUE deficits/detail RUE Deficits / Details: generalized weakness; AROM shoulder flexion to approximately 90 degress at baseline; all other AROM WFL; decreased coordination; hx of peripheral neuropathy RUE Sensation: history of peripheral neuropathy RUE Coordination: decreased fine motor;decreased gross  motor LUE Deficits / Details: generalized weakness; AROM shoulder flexion to approximately 80 degress at baseline; all other AROM WFL; decreased coordination; hx of peripheral neuropathy LUE Sensation: history of peripheral neuropathy LUE Coordination: decreased fine motor;decreased gross motor   Lower Extremity Assessment Lower Extremity Assessment: Defer to PT evaluation       Communication Communication Communication: No apparent difficulties;Other (comment) Factors Affecting Communication: Other (comment) (pt requiring mildly increased time for processing and word finding)   Cognition Arousal: Alert Behavior During Therapy: Flat affect Cognition: Cognition impaired     Awareness: Intellectual awareness intact, Online awareness intact (intermittent online awareness) Memory impairment (select all impairments): Working memory Attention impairment (select first level of impairment): Selective attention Executive functioning impairment (select all impairments): Organization, Sequencing, Reasoning, Problem solving OT - Cognition Comments: Pt AAOx4 and pleasant throughout session with cognitive deficits noted above. Per pt and son, pt cognition WFL at baseline.                 Following commands: Intact       Cueing  General Comments   Cueing Techniques: Verbal cues;Gestural cues  HR 87, SpO2 95% on RA, BP 137/66. Successful BM on BSC. Required assist with peri-care from OT while PT assisted with balance and RW support. Pt's son present and supportive throughout session. RN present at end of session.   Exercises     Shoulder Instructions      Home Living Family/patient expects to be discharged to:: Private residence Living Arrangements: Children Available Help at Discharge: Family;Available 24 hours/day Type of Home: House Home Access: Stairs to enter [employer] of Steps: 1 Entrance Stairs-Rails: None Home Layout: One level     Bathroom Shower/Tub:  Producer, Television/film/video: Handicapped height Bathroom Accessibility: Yes   Home Equipment: Rollator (4 wheels);Electric scooter;Grab bars - tub/shower;Grab bars - toilet;Lift chair  Prior Functioning/Environment Prior Level of Function : Independent/Modified Independent;Driving             Mobility Comments: Ind to Mod I with use of rollator indoors, scooter outdoors; reports no falls ADLs Comments: Ind with ADLs and IADLs; drives; stands to shower    OT Problem List: Decreased strength;Decreased activity tolerance;Impaired balance (sitting and/or standing);Decreased coordination;Decreased cognition;Decreased safety awareness;Decreased knowledge of use of DME or AE;Decreased knowledge of precautions   OT Treatment/Interventions: Self-care/ADL training;Therapeutic exercise;Energy conservation;DME and/or AE instruction;Therapeutic activities;Cognitive remediation/compensation;Patient/family education;Balance training      OT Goals(Current goals can be found in the care plan section)   Acute Rehab OT Goals Patient Stated Goal: to fell better, return home, and be as independent as possible OT Goal Formulation: With patient/family Time For Goal Achievement: 01/26/24 Potential to Achieve Goals: Good ADL Goals Pt Will Perform Grooming: with contact guard assist;standing Pt Will Perform Upper Body Bathing: with contact guard assist;sitting (with adaptive equipment as needed) Pt Will Perform Lower Body Dressing: with min assist;sit to/from stand (with adaptive equipment as needed) Pt Will Transfer to Toilet: with contact guard assist;ambulating;regular height toilet;grab bars (with least restrictive AD) Pt Will Perform Toileting - Clothing Manipulation and hygiene: with contact guard assist;with min assist;sit to/from stand   OT Frequency:  Min 2X/week    Co-evaluation PT/OT/SLP Co-Evaluation/Treatment: Yes Reason for Co-Treatment: Complexity of the patient's  impairments (multi-system involvement);For patient/therapist safety;To address functional/ADL transfers PT goals addressed during session: Mobility/safety with mobility;Balance;Proper use of DME OT goals addressed during session: ADL's and self-care      AM-PAC OT 6 Clicks Daily Activity     Outcome Measure Help from another person eating meals?: A Little Help from another person taking care of personal grooming?: A Little Help from another person toileting, which includes using toliet, bedpan, or urinal?: Total Help from another person bathing (including washing, rinsing, drying)?: A Lot Help from another person to put on and taking off regular upper body clothing?: A Lot Help from another person to put on and taking off regular lower body clothing?: Total 6 Click Score: 12   End of Session Equipment Utilized During Treatment: Rolling [place] (2 wheels);Other (comment) [facility]) Nurse Communication: Mobility status;Other (comment) (Pt had a BM during session with peri care completed. Pt requires +2 assist for transfers. Pt transfering to a different unit)  Activity Tolerance: Patient tolerated treatment well Patient left: Other (comment);with nursing/sitter in room;with family/visitor present (in transport w/c preparing to transition to another unit)  OT Visit Diagnosis: Unsteadiness on feet (R26.81);Other abnormalities of gait and mobility (R26.89);Muscle weakness (generalized) (M62.81);Other symptoms and signs involving cognitive function                Time: 8570-8486 OT Time Calculation (min): 44 min Charges:  OT General Charges $OT Visit: 1 Visit OT Evaluation $OT Eval Moderate Complexity: 1 Mod  Margarie Rockey HERO., OTR/L, MA Acute Rehab [phone]   Margarie FORBES Horns 01/12/2024, 7:31 PM

## 2024-01-12 NOTE — Progress Notes (Signed)
 "  NAME:  Peter Bryan, MRN:  [id number], DOB:  05-15-1960, LOS: 2 ADMISSION DATE:  01/10/2024, CONSULTATION DATE: 01/10/2024 REFERRING MD: ED team, CHIEF COMPLAINT: Altered mental status  History of Present Illness:  64 year old male with history of HTN, stage IV CKD with recurrent hyperkalemia, DM2, OSA, morbid obesity, multiple back surgeries, chronic pain syndrome, presents to the med [facility] ED, brought by the son for altered mental status.  Also complaining of constipation at baseline from prior surgeries, and has not urinated for the last 24 hours.  He is nonambulatory at baseline and on disability from prior surgeries.  Patient reports nausea, chest pain, shortness of breath and urine retention.  Denies any cough, or upper respiratory symptoms At the ED was found to be lethargic, AOX3, protecting airway on nasal cannula, in shock requiring levo, and severe metabolic acidosis bicarb 11, anion gap of 17, and hyperkalemia of 6.7.  Creatinine 6.9, with GFR of 8. EKG did not show any signs of acute ischemia.  Tropes was 152 ED team is initiating temporizing measures for hyperkalemia and severe metabolic acidosis, placing a central line, patient will be transferred to [facility].   Pertinent  Medical History  HTN, CKD stage IV, recurrent hyperkalemia, DM2, OSA, morbid obesity, multiple back surgeries  Significant Hospital Events: Including procedures, antibiotic start and stop dates in addition to other pertinent events   Admission to [facility], ICU 01/10/2023   Interim History / Subjective:  Weaned off the vent, now on room air. Weaning NE 10 mcg >>2 mcg..   Objective    Blood pressure (!) 142/68, pulse 79, temperature 99.1 F (37.3 C), resp. rate 14, height 5' 11 (1.803 m), weight 132 kg, SpO2 94%.    FiO2 (%):  [21 %] 21 %   Intake/Output Summary (Last 24 hours) at 01/12/2024 9078 Last data filed at 01/12/2024 0900 Gross per 24 hour  Intake 2006.7 ml  Output 4894.9 ml  Net  -2888.2 ml   Filed Weights   01/10/24 2214 01/11/24 0155 01/12/24 0500  Weight: (!) 137 kg (!) 137 kg 132 kg    Labs: WBC 11.6 >> 7.2 BMP - K5.6, CO2 11 >>15  Micro: MRSA PCR negative RVP negative BCx negative@24   Examination: General: Critically ill, lying in bed HENT: NCAT Lungs: Medical chest wall movement, clear to auscultation Cardiovascular: S1-S2 normal, no murmur Abdomen: Soft nontender nondistended Extremities: Warm, minimal edema Neuro: AO X3, lethargic, moves all extremities GU: Deferred  Resolved problem list   Septic shock Acute metabolic encephalopathy Severe metabolic acidosis High anion gap metabolic acidosis  Assessment and Plan   64 year old male with history of HTN, CKD stage IV, recurrent hyperkalemia, DM2, presents from OSH with altered mental status, shortness of breath and urinary retention, found to be in shock & severe metabolic acidosis  AKI on CKD 4 Likely CAP Likely gout flare up DM 2 BPH   Plan - Improving overall, now on room air. - Nephrology team on board, on CRRT.  Hyperkalemia resolved- likely d/c CRRT today- making good urine - Strict I&O's, Foley - Resume home tamsulosin . - continue empiric antibiotics for 5 days - resume gabapentin  at a lower dose - check uric acid- may start steroids for gout flare up  Labs   CBC: Recent Labs  Lab 01/10/24 1758 01/10/24 2314 01/11/24 0541 01/11/24 1107  WBC 11.6* 10.6* 7.2  --   NEUTROABS 9.9* 8.6*  --   --   HGB 10.2* 10.2* 9.6* 8.5*  HCT  33.8* 33.0* 30.8* 25.0*  MCV 94.7 94.8 92.8  --   PLT 149* 142* 141*  --     Basic Metabolic Panel: Recent Labs  Lab 01/10/24 1758 01/10/24 2314 01/11/24 0541 01/11/24 1107 01/11/24 1505 01/12/24 0324  NA 133* 134* 133*  134* 135 135 138  K 6.7* 6.2* 5.6*  5.7* 4.7 4.5 4.2  CL 105 108 106  107  --  104 103  CO2 11* 12* 15*  14*  --  21* 27  GLUCOSE 136* 142* 95  95  --  145* 78  BUN 109* 104* 90*  90*  --  69* 55*   CREATININE 6.97* 6.84* 5.82*  5.80*  --  4.43* 3.18*  CALCIUM  8.6* 8.4* 8.4*  8.2*  --  8.4* 8.7*  MG  --   --  2.1  --   --  1.8  PHOS  --  6.5* 5.4*  5.3*  --  4.0 3.1   GFR: Estimated Creatinine Clearance: 32.1 mL/min (A) (by C-G formula based on SCr of 3.18 mg/dL (H)). Recent Labs  Lab 01/10/24 1753 01/10/24 1758 01/10/24 2024 01/10/24 2314 01/11/24 0514 01/11/24 0541  WBC  --  11.6*  --  10.6*  --  7.2  LATICACIDVEN 0.5  --  0.8  --  0.3*  --     Liver Function Tests: Recent Labs  Lab 01/10/24 1758 01/10/24 2314 01/11/24 0541 01/11/24 1505 01/12/24 0324  AST 11* 11*  --   --   --   ALT 21 18  --   --   --   ALKPHOS 79 72  --   --   --   BILITOT 0.3 0.3  --   --   --   PROT 7.0 6.2*  --   --   --   ALBUMIN 4.4 3.9 3.4* 3.4* 3.4*   Recent Labs  Lab 01/10/24 2314  LIPASE 98*  AMYLASE 91   No results for input(s): AMMONIA in the last 168 hours.  ABG    Component Value Date/Time   PHART 7.336 (L) 01/11/2024 1107   PCO2ART 35.4 01/11/2024 1107   PO2ART 74 (L) 01/11/2024 1107   HCO3 19.0 (L) 01/11/2024 1107   TCO2 20 (L) 01/11/2024 1107   ACIDBASEDEF 6.0 (H) 01/11/2024 1107   O2SAT 94 01/11/2024 1107     Coagulation Profile: Recent Labs  Lab 01/10/24 1758 01/10/24 2314  INR 1.1 1.2    Cardiac Enzymes: No results for input(s): CKTOTAL, CKMB, CKMBINDEX, TROPONINI in the last 168 hours.  HbA1C: Hgb A1c MFr Bld  Date/Time Value Ref Range Status  01/12/2024 03:24 AM 5.4 4.8 - 5.6 % Final    Comment:    (NOTE) Diagnosis of Diabetes The following HbA1c ranges recommended by the American Diabetes Association (ADA) may be used as an aid in the diagnosis of diabetes mellitus.  Hemoglobin             Suggested A1C NGSP%              Diagnosis  <5.7                   Non Diabetic  5.7-6.4                Pre-Diabetic  >6.4                   Diabetic  <7.0  Glycemic control for                       adults with  diabetes.    03/07/2019 01:19 PM 6.8 (H) 4.8 - 5.6 % Final    Comment:    (NOTE) Pre diabetes:          5.7%-6.4% Diabetes:              >6.4% Glycemic control for   <7.0% adults with diabetes     CBG: Recent Labs  Lab 01/11/24 1149 01/11/24 1554 01/11/24 2001 01/11/24 2343 01/12/24 0801  GLUCAP 77 126* 105* 159* 91     Home Medications  Prior to Admission medications  Medication Sig Start Date End Date Taking? Authorizing Provider  aspirin  EC 81 MG tablet Take 1 tablet (81 mg total) by mouth daily. Swallow whole. 01/21/22   Lonni Slain, MD  baclofen  (LIORESAL ) 10 MG tablet Take 10 mg by mouth daily as needed for muscle spasms.    [provider]  gabapentin  (NEURONTIN ) 300 MG capsule Take 300 mg by mouth 4 (four) times daily. 07/22/08   [provider]  glipiZIDE (GLUCOTROL XL) 2.5 MG 24 hr tablet Take 2.5 mg by mouth daily with breakfast. 12/31/19   [provider]  hydrALAZINE (APRESOLINE) 25 MG tablet Take 25 mg by mouth 2 (two) times daily. 09/04/21   [provider]  HYDROcodone -acetaminophen  (NORCO) 10-325 MG per tablet Take 2-2.5 tablets by mouth See admin instructions. Take 2.5 mg in the morning and at lunch as 2 tables in the evening    [provider]  JANUVIA 50 MG tablet Take 1 tablet by mouth daily. 04/15/16   [provider]  JARDIANCE  10 MG TABS tablet TAKE 1 TABLET DAILY BEFORE BREAKFAST 07/29/23   Lonni Slain, MD  LOKELMA  10 g PACK packet Take 10 g by mouth 3 (three) times a week.    [provider]  magnesium oxide (MAG-OX) 400 MG tablet Take 400 mg by mouth daily. 09/26/20   [provider]  Peppermint Oil (IBGARD) 90 MG CPCR Take by mouth. 10/08/21   [provider]  rosuvastatin (CRESTOR) 5 MG tablet Take 5 mg by mouth daily.      [provider]  sodium bicarbonate  650 MG tablet Take 650 mg by mouth daily. 09/26/20   [provider]   testosterone  cypionate (DEPOTESTOSTERONE CYPIONATE) 200 MG/ML injection Inject 200 mg into the muscle once a week. 02/24/19   [provider]  thiamine  50 MG tablet Take 50 mg by mouth 2 (two) times daily.    [provider]  TRULICITY 0.75 MG/0.5ML SOPN Inject 0.75 mg into the skin. Patient not taking: Reported on 09/03/2022 01/18/22   [provider]  Turmeric (QC TUMERIC COMPLEX PO) Take 4,500 mg by mouth daily at 12 noon.    [provider]  valsartan-hydrochlorothiazide  (DIOVAN-HCT) 320-25 MG tablet Take 1 tablet by mouth at bedtime. Midnight    [provider]  Vitamin D , Ergocalciferol , (DRISDOL) 1.25 MG (50000 UNIT) CAPS capsule Take 50,000 Units by mouth 2 (two) times a week.    [provider]  zolpidem  (AMBIEN ) 10 MG tablet Take 10 mg by mouth at bedtime.  01/31/19   [provider]     Critical care time: 30 mins    Fantasy Donald Pleas MD [facility] EPIC chat 7am-7pm,  If unanswered, please page CCM On-call: #[phone]  For overnight  concerns for ICU patients, call E link    "

## 2024-01-12 NOTE — Progress Notes (Signed)
 Nutrition Brief Note  Patient coming off CRRT today, per RN. Recommend leaving regular diet in place to mimic diet at home while assessing for renal recovering so as to not falsely skew lab trends. Will monitor electrolytes and modify to renal diet should iHD be indicated. Noted with good UOP, per nephrology. Fluid restriction remains in place.   INTERVENTION:  Continue liberalized diet and fluid restriction while assessing for renal recovery   Continue Ensure Plus High Protein po BID, each supplement provides 350 kcal and 20 grams of protein    Continue to monitor magnesium, potassium, and phosphorus for refeeding and renal recovery   Continue Thiamine  100 mg daily for 5 days    Continue renal MVI    Monitor bowel frequency and need for scheduled bowel regimen as this can impact lab trend   NUTRITION DIAGNOSIS:  Increased nutrient needs related to acute illness as evidenced by estimated needs.   GOAL:  Patient will meet greater than or equal to 90% of their needs  Blair Deaner MS, RD, LDN Registered Dietitian I Clinical Nutrition RD Inpatient Contact Info in Amion

## 2024-01-12 NOTE — Plan of Care (Signed)

## 2024-01-12 NOTE — Progress Notes (Signed)
 [facility] Progress Note   Assessment/ Plan:    AKI on CKD IV:             - baseline 3.11 as of 10/2023 - 2/2 hemodynamic insults in setting of septic shock - on CRRT - hyperkalemia and acidosis improving - d/c CRRT today and follow for further dialytic needs   2.  Septic shock:                      - broad spectrum abx and pressors via PCCM             - looks like possible pna LLL   3.  Severe hyperkalemia:             - received temporizing measures             - correcting via CRRT   4.  DM II             - per primary   5.  Dispo: pending  Subjective:    Seen in room.  Off pressors, making urine, doing much better.  Will take down CRRT today.     Objective:   BP (!) 133/56 (BP Location: Right Arm)   Pulse 77   Temp 100.2 F (37.9 C) (Bladder)   Resp 13   Ht 5' 11 (1.803 m)   Wt 132 kg   SpO2 93%   BMI 40.59 kg/m   Intake/Output Summary (Last 24 hours) at 01/12/2024 1233 Last data filed at 01/12/2024 1200 Gross per 24 hour  Intake 2240 ml  Output 4482 ml  Net -2242 ml   Weight change: -5 kg  Physical Exam: Physical Exam GEN appears better HEENT eyes closed NECK + nontunneled HD cath PULM clear CV tachy ABD soft, mildly distended EXT 2+ LE edema NEURO  AAO x 3 but does repeat  himself a little   Imaging: ECHOCARDIOGRAM COMPLETE Result Date: 01/11/2024    ECHOCARDIOGRAM REPORT   Patient Name:   Peter Bryan Date of Exam: 01/11/2024 Medical Rec #:  [id number]     Height:       71.0 in Accession #:    [id number]    Weight:       302.0 lb Date of Birth:  [birth year]     BSA:          2.512 m Patient Age:    63 years      BP:           114/61 mmHg Patient Gender: M             HR:           69 bpm. Exam Location:  Inpatient Procedure: 2D Echo, Cardiac Doppler and Color Doppler (Both Spectral and Color            Flow Doppler were utilized during procedure). Indications:    Abnormal ECG  History:        Patient has no prior history of  Echocardiogram examinations.                 Risk Factors:Sleep Apnea, Diabetes, Hypertension and                 Dyslipidemia.  Sonographer:    Sherlean Dubin Referring Phys: [id number] ABDULLAHI HUSSEIN  Sonographer Comments: Image acquisition challenging due to patient body habitus and Image acquisition challenging due to respiratory motion. IMPRESSIONS  1. Left ventricular ejection fraction,  by estimation, is 60 to 65%. The left ventricle has normal function. The left ventricle has no regional wall motion abnormalities. Left ventricular diastolic parameters are consistent with Grade I diastolic dysfunction (impaired relaxation).  2. Right ventricular systolic function is normal. The right ventricular size is normal. There is normal pulmonary artery systolic pressure.  3. The mitral valve is normal in structure. Mild mitral valve regurgitation. No evidence of mitral stenosis.  4. The aortic valve is tricuspid. There is mild calcification of the aortic valve. Aortic valve regurgitation is not visualized. Aortic valve sclerosis/calcification is present, without any evidence of aortic stenosis.  5. The inferior vena cava is dilated in size with <50% respiratory variability, suggesting right atrial pressure of 15 mmHg. FINDINGS  Left Ventricle: Left ventricular ejection fraction, by estimation, is 60 to 65%. The left ventricle has normal function. The left ventricle has no regional wall motion abnormalities. The left ventricular internal cavity size was normal in size. There is  no left ventricular hypertrophy. Left ventricular diastolic parameters are consistent with Grade I diastolic dysfunction (impaired relaxation). Right Ventricle: The right ventricular size is normal. No increase in right ventricular wall thickness. Right ventricular systolic function is normal. There is normal pulmonary artery systolic pressure. The tricuspid regurgitant velocity is 1.23 m/s, and  with an assumed right atrial pressure of 10  mmHg, the estimated right ventricular systolic pressure is 16.1 mmHg. Left Atrium: Left atrial size was normal in size. Right Atrium: Right atrial size was normal in size. Pericardium: There is no evidence of pericardial effusion. Mitral Valve: The mitral valve is normal in structure. Mild mitral valve regurgitation. No evidence of mitral valve stenosis. MV peak gradient, 6.4 mmHg. The mean mitral valve gradient is 3.0 mmHg. Tricuspid Valve: The tricuspid valve is normal in structure. Tricuspid valve regurgitation is trivial. No evidence of tricuspid stenosis. Aortic Valve: The aortic valve is tricuspid. There is mild calcification of the aortic valve. Aortic valve regurgitation is not visualized. Aortic valve sclerosis/calcification is present, without any evidence of aortic stenosis. Aortic valve mean gradient measures 5.5 mmHg. Aortic valve peak gradient measures 10.8 mmHg. Aortic valve area, by VTI measures 2.79 cm. Pulmonic Valve: The pulmonic valve was normal in structure. Pulmonic valve regurgitation is not visualized. No evidence of pulmonic stenosis. Aorta: The aortic root is normal in size and structure. Venous: The inferior vena cava is dilated in size with less than 50% respiratory variability, suggesting right atrial pressure of 15 mmHg. IAS/Shunts: No atrial level shunt detected by color flow Doppler.  LEFT VENTRICLE PLAX 2D LVIDd:         5.90 cm   Diastology LVIDs:         4.30 cm   LV e' medial:    6.22 cm/s LV PW:         1.30 cm   LV E/e' medial:  13.3 LV IVS:        0.80 cm   LV e' lateral:   9.32 cm/s LVOT diam:     2.10 cm   LV E/e' lateral: 8.9 LV SV:         81 LV SV Index:   32 LVOT Area:     3.46 cm  RIGHT VENTRICLE             IVC RV Basal diam:  3.90 cm     IVC diam: 2.30 cm RV Mid diam:    3.40 cm RV S prime:     15.20 cm/s TAPSE (  M-mode): 1.8 cm LEFT ATRIUM           Index        RIGHT ATRIUM           Index LA diam:      4.20 cm 1.67 cm/m   RA Area:     16.20 cm LA Vol (A2C): 46.4  ml 18.47 ml/m  RA Volume:   40.40 ml  16.08 ml/m LA Vol (A4C): 51.7 ml 20.58 ml/m  AORTIC VALVE AV Area (Vmax):    2.75 cm AV Area (Vmean):   2.66 cm AV Area (VTI):     2.79 cm AV Vmax:           164.00 cm/s AV Vmean:          109.500 cm/s AV VTI:            0.292 m AV Peak Grad:      10.8 mmHg AV Mean Grad:      5.5 mmHg LVOT Vmax:         130.00 cm/s LVOT Vmean:        84.250 cm/s LVOT VTI:          0.234 m LVOT/AV VTI ratio: 0.80  AORTA Ao Root diam: 3.50 cm Ao Asc diam:  3.50 cm MITRAL VALVE                TRICUSPID VALVE MV Area (PHT): 3.27 cm     TR Peak grad:   6.1 mmHg MV Area VTI:   1.96 cm     TR Vmax:        123.00 cm/s MV Peak grad:  6.4 mmHg MV Mean grad:  3.0 mmHg     SHUNTS MV Vmax:       1.26 m/s     Systemic VTI:  0.23 m MV Vmean:      74.8 cm/s    Systemic Diam: 2.10 cm MV Decel Time: 232 msec MR Peak grad: 15.1 mmHg MR Vmax:      194.00 cm/s MV E velocity: 82.60 cm/s MV A velocity: 118.00 cm/s MV E/A ratio:  0.70 Peter Fuel MD Electronically signed by Peter Fuel MD Signature Date/Time: 01/11/2024/3:45:59 PM    Final    CT CHEST ABDOMEN PELVIS WO CONTRAST Result Date: 01/10/2024 EXAM: CT CHEST, ABDOMEN AND PELVIS WITHOUT CONTRAST 01/10/2024 09:01:58 PM TECHNIQUE: CT of the chest, abdomen and pelvis was performed without the administration of intravenous contrast. Multiplanar reformatted images are provided for review. Automated exposure control, iterative reconstruction, and/or weight based adjustment of the mA/kV was utilized to reduce the radiation dose to as low as reasonably achievable. COMPARISON: 10/03/2021 CLINICAL HISTORY: Sepsis FINDINGS: CHEST: MEDIASTINUM AND LYMPH NODES: Coronary artery aortic atherosclerosis. Heart and pericardium are unremarkable. The central airways are clear. No mediastinal, hilar or axillary lymphadenopathy. LUNGS AND PLEURA: Bibasilar dependent atelectasis. More confluent opacity in the left lower lobe could reflect early pneumonia. No pleural  effusion. No pneumothorax. ABDOMEN AND PELVIS: LIVER: Unremarkable. GALLBLADDER AND BILE DUCTS: Unremarkable. No biliary ductal dilatation. SPLEEN: No acute abnormality. PANCREAS: No acute abnormality. ADRENAL GLANDS: No acute abnormality. KIDNEYS, URETERS AND BLADDER: 5.7 cm simple appearing cyst off the upper pole of the right kidney. Per consensus, no follow-up is needed for simple Bosniak type 1 and 2 renal cysts, unless the patient has a malignancy history or risk factors. Foley catheter in the bladder, which is decompressed. No stones in the kidneys or ureters. No hydronephrosis. No perinephric or periureteral  stranding. GI AND BOWEL: Stomach demonstrates no acute abnormality. There is no bowel obstruction. REPRODUCTIVE ORGANS: No acute abnormality. PERITONEUM AND RETROPERITONEUM: No ascites. No free air. VASCULATURE: Aorta is normal in caliber. Aortic atherosclerosis. ABDOMINAL AND PELVIS LYMPH NODES: No lymphadenopathy. BONES AND SOFT TISSUES: Postoperative changes in the spine. Advanced degenerative changes throughout the spine. No acute bony abnormality. No focal soft tissue abnormality. IMPRESSION: 1. Bibasilar dependent atelectasis. Oopacity in the left lower lobe, possibly representing early pneumonia. 2. Coronary artery disease, aortic atherosclerosis. Electronically signed by: Franky Crease MD 01/10/2024 09:10 PM EST RP Workstation: [id number]   DG Chest Port 1 View Result Date: 01/10/2024 EXAM: 1 VIEW(S) XRAY OF THE CHEST 01/10/2024 06:05:00 PM COMPARISON: Prior study dated 12/17/2020. CLINICAL HISTORY: Questionable sepsis - evaluate for abnormality FINDINGS: LUNGS AND PLEURA: Shallow inspiration. Linear atelectasis in the lung bases. No pleural effusion. No pneumothorax. HEART AND MEDIASTINUM: Cardiac enlargement. Mediastinal contours appear intact. BONES AND SOFT TISSUES: Postoperative changes in the cervical and thoracic spine. IMPRESSION: 1. No acute cardiopulmonary abnormality. 2. Cardiomegaly.  Electronically signed by: Elsie Gravely MD 01/10/2024 06:17 PM EST RP Workstation: [id number]    Labs: BMET Recent Labs  Lab 01/10/24 1758 01/10/24 2314 01/11/24 0541 01/11/24 1107 01/11/24 1505 01/12/24 0324  NA 133* 134* 133*  134* 135 135 138  K 6.7* 6.2* 5.6*  5.7* 4.7 4.5 4.2  CL 105 108 106  107  --  104 103  CO2 11* 12* 15*  14*  --  21* 27  GLUCOSE 136* 142* 95  95  --  145* 78  BUN 109* 104* 90*  90*  --  69* 55*  CREATININE 6.97* 6.84* 5.82*  5.80*  --  4.43* 3.18*  CALCIUM  8.6* 8.4* 8.4*  8.2*  --  8.4* 8.7*  PHOS  --  6.5* 5.4*  5.3*  --  4.0 3.1   CBC Recent Labs  Lab 01/10/24 1758 01/10/24 2314 01/11/24 0541 01/11/24 1107  WBC 11.6* 10.6* 7.2  --   NEUTROABS 9.9* 8.6*  --   --   HGB 10.2* 10.2* 9.6* 8.5*  HCT 33.8* 33.0* 30.8* 25.0*  MCV 94.7 94.8 92.8  --   PLT 149* 142* 141*  --     Medications:     Chlorhexidine  Gluconate Cloth  6 each Topical Daily   feeding supplement  237 mL Oral BID BM   gabapentin   100 mg Oral QID   heparin   5,000 Units Subcutaneous Q8H   heparin  sodium (porcine)  1,000 Units Intravenous Once   [START ON 01/13/2024] linaclotide   290 mcg Oral QAC breakfast   multivitamin  1 tablet Oral QHS   mouth rinse  15 mL Mouth Rinse 4 times per day   polyethylene glycol  17 g Oral Daily   senna  1 tablet Oral BID   tamsulosin   0.4 mg Oral Daily   thiamine   100 mg Oral Daily    Almarie Bonine MD 01/12/2024, 12:33 PM

## 2024-01-13 DIAGNOSIS — R579 Shock, unspecified: Secondary | ICD-10-CM | POA: Diagnosis not present

## 2024-01-13 LAB — RENAL FUNCTION PANEL
Albumin: 3.6 g/dL (ref 3.5–5.0)
Anion gap: 9 (ref 5–15)
BUN: 55 mg/dL — ABNORMAL HIGH (ref 8–23)
CO2: 27 mmol/L (ref 22–32)
Calcium: 8.8 mg/dL — ABNORMAL LOW (ref 8.9–10.3)
Chloride: 104 mmol/L (ref 98–111)
Creatinine, Ser: 3.05 mg/dL — ABNORMAL HIGH (ref 0.61–1.24)
GFR, Estimated: 22 mL/min — ABNORMAL LOW
Glucose, Bld: 153 mg/dL — ABNORMAL HIGH (ref 70–99)
Phosphorus: 2.1 mg/dL — ABNORMAL LOW (ref 2.5–4.6)
Potassium: 4.3 mmol/L (ref 3.5–5.1)
Sodium: 141 mmol/L (ref 135–145)

## 2024-01-13 LAB — LEGIONELLA PNEUMOPHILA SEROGP 1 UR AG: L. pneumophila Serogp 1 Ur Ag: NEGATIVE

## 2024-01-13 LAB — CBC
HCT: 28.3 % — ABNORMAL LOW (ref 39.0–52.0)
Hemoglobin: 9.1 g/dL — ABNORMAL LOW (ref 13.0–17.0)
MCH: 29.3 pg (ref 26.0–34.0)
MCHC: 32.2 g/dL (ref 30.0–36.0)
MCV: 91 fL (ref 80.0–100.0)
Platelets: 124 K/uL — ABNORMAL LOW (ref 150–400)
RBC: 3.11 MIL/uL — ABNORMAL LOW (ref 4.22–5.81)
RDW: 13.5 % (ref 11.5–15.5)
WBC: 10.9 K/uL — ABNORMAL HIGH (ref 4.0–10.5)
nRBC: 0 % (ref 0.0–0.2)

## 2024-01-13 NOTE — Progress Notes (Signed)
 Inpatient Rehab Admissions Coordinator:   Per therapy recommendations pt was screened for CIR by Reche Lowers, PT, DPT.  Chart reviewed, pt admitted with AMS, septic shock, brief need for CRRT and pressor support.  Workup so far unrevealing and pt stabilizing.  At this point I do not believe we would be able to get insurance approval with his current presentation.  Happy to rescreen in another day or two to see if anything further revealed during workup.  No consult at this time.   Reche Lowers, PT, DPT Admissions Coordinator [phone] 01/13/2024 10:15 AM

## 2024-01-13 NOTE — TOC CM/SW Note (Signed)
 Transition of Care [facility]) - Inpatient Brief Assessment   Patient Details  Name: Peter Bryan MRN: [id number] Date of Birth: 07-16-1960  [facility]) CM/SW Contact:    Tom-Johnson, Harvest Muskrat, RN Phone Number: 01/13/2024, 1:17 PM   Clinical Narrative:  Patient presented to the ED with Altered Mental Status, N/V, Chest pain and unable to Urinate in 24hrs. Patient was found to be in Shock, Potassium at 6.7, Creatinine 6.9, started on Levo and transferred to ICU. Patient has hx of HTN, CKD 5, recurrent Hyperkalemia, T2DM, OSA, Morbid Obesity, multiple Back Sx. Nephrology was consulted, started on CRRT. Hyperkalemia and Acidosis improving, CRRT was d/c'd yesterday 01 08/26. Currently on room air, IV abx.  CM spoke with patient at bedside about needs for post hospital transition. Patient lives with his wife, Peter Bryan and son Peter Bryan. Has three children and three supportive siblings. Not employed, on disability. Has a CPAP, Wyszynski and scooter at home. Family transports to and from appointments.  PCP is [place], Silvano FERNS, NP and uses [facility] on [address] in [place], also uses [employer].   CIR recommended, patient is currently stabilizing and might not be able to get insurance approval with his current presentation per CIR. Other Rehab avenues will be followed.   Patient not Medically ready for discharge.  CM will continue to follow for home health needs as patient progresses with care towards discharge.                Transition of Care Asessment: Insurance and Status: Insurance coverage has been reviewed Patient has primary care physician: Yes Home environment has been reviewed: Yes Prior level of function:: Modified Independent Prior/Current Home Services: No current home services Social Drivers of Health Review: SDOH reviewed no interventions necessary Readmission risk has been reviewed: Yes Transition of care needs: transition of care needs  identified, TOC will continue to follow

## 2024-01-13 NOTE — Progress Notes (Signed)
 Pt states he is supposed to take 15mg  of Ambien  instead of 10mg . RN messaged MD on call.  Bari HERO Tranell Wojtkiewicz

## 2024-01-13 NOTE — Progress Notes (Signed)
 Physical Therapy Treatment Patient Details Name: Peter Bryan MRN: [id number] DOB: 08/21/1960 Today's Date: 01/13/2024   History of Present Illness 64 y.o. male admitted 01/10/24 with worsening weakness, AMS, SOB. Workup for septic shock, severe hyperkalemia, AKI on CKD IV requiring CRRT 1/6-1/8. PMH includes HTN, DM2, CKD IV, multiple back sxs, chronic pain syndrome.   PT Comments  Pt progressing with mobility. Today's session focused on transfer and pregait training with RW, pt requiring frequent modA to stand and prevent LOB. Pt remains limited by generalized weakness, decreased activity tolerance, poor balance strategies/postural reactions and impaired cognition. Continue to recommend intensive post-acute rehab services (> 3 hrs/day) to maximize functional mobility and independence prior to return home.     If plan is discharge home, recommend the following: A lot of help with walking and/or transfers;A lot of help with bathing/dressing/bathroom;Assistance with cooking/housework;Assist for transportation;Help with stairs or ramp for entrance   Can travel by private vehicle      No  Equipment Recommendations   (TBD next venue, though fairly well equipped)    Recommendations for Other Services Rehab consult     Precautions / Restrictions Precautions Precautions: Fall;Other (comment) Recall of Precautions/Restrictions: Impaired Precaution/Restrictions Comments: c/o R elbow pain and difficulty weight bearing (olecranon painful to palpation; seems like MD note attributing to gout flare up) Restrictions Weight Bearing Restrictions Per Provider Order: No     Mobility  Bed Mobility Overal bed mobility: Needs Assistance Bed Mobility: Supine to Sit     Supine to sit: Supervision, HOB elevated, Used rails     General bed mobility comments: significant increased time and effort, heavy use of bed rails    Transfers Overall transfer level: Needs assistance Equipment used: Rolling  Maggi (2 wheels) Transfers: Sit to/from Stand, Bed to chair/wheelchair/BSC Sit to Stand: Mod assist, From elevated surface   Step pivot transfers: Mod assist       General transfer comment: able to stand on second attempt from elevated bed height to RW with modA for trunk elevation, heavy reliance on BUE support to push to stand, assist to bring RW closer once pt upright; pivotal steps ~2' from bed to recliner with increased time, multiple posterior LOB requiring intermittent modA to correct, difficulty clearing R foot    Ambulation/Gait Ambulation/Gait assistance: Mod assist   Assistive device: Rolling Feinstein (2 wheels)       Pre-gait activities: seated rest in recliner then 2x bouts of standing hip flex/marching in place requiring minA for stability when lifting L foot but modA when lifting R foot due to painful R elbow with increased WB on RW; 2x steps forward/backwards with posterior LOB requiring modA to correct     Stairs             Wheelchair Mobility     Tilt Bed    Modified Rankin (Stroke Patients Only)       Balance Overall balance assessment: Needs assistance Sitting-balance support: No upper extremity supported, Feet supported Sitting balance-Leahy Scale: Fair     Standing balance support: Bilateral upper extremity supported, Reliant on assistive device for balance, During functional activity Standing balance-Leahy Scale: Poor                              Communication Communication Communication: No apparent difficulties  Cognition Arousal: Alert Behavior During Therapy: Flat affect   PT - Cognitive impairments: Awareness, Attention, Initiation, Sequencing, Problem solving  PT - Cognition Comments: A&Ox4, slowed processing and initiation at times, repetitive during session; family reports WFL baseline Following commands: Intact Following commands impaired: Only follows one step commands  consistently    Cueing Cueing Techniques: Verbal cues, Gestural cues  Exercises      General Comments General comments (skin integrity, edema, etc.): SpO2 96% on RA, HR 75. educ re: role of acute PT, POC, activity recommendations, importance of OOB mobility, potential d/c needs (also discussed this with son in hallway prior to session)      Pertinent Vitals/Pain Pain Assessment Pain Assessment: Faces Faces Pain Scale: Hurts even more Pain Location: R elbow with AROM/weight bearing, bilateral feet (heaviness) Pain Descriptors / Indicators: Discomfort, Guarding Pain Intervention(s): Monitored during session, Limited activity within patient's tolerance    Home Living                          Prior Function            PT Goals (current goals can now be found in the care plan section) Progress towards PT goals: Progressing toward goals    Frequency    Min 3X/week      PT Plan      Co-evaluation              AM-PAC PT 6 Clicks Mobility   Outcome Measure  Help needed turning from your back to your side while in a flat bed without using bedrails?: A Little Help needed moving from lying on your back to sitting on the side of a flat bed without using bedrails?: A Little Help needed moving to and from a bed to a chair (including a wheelchair)?: A Lot Help needed standing up from a chair using your arms (e.g., wheelchair or bedside chair)?: A Lot Help needed to walk in hospital room?: Total Help needed climbing 3-5 steps with a railing? : Total 6 Click Score: 12    End of Session Equipment Utilized During Treatment: Gait belt Activity Tolerance: Patient tolerated treatment well Patient left: in chair;with call bell/phone within reach;with chair alarm set Nurse Communication: Mobility status PT Visit Diagnosis: Unsteadiness on feet (R26.81);Other abnormalities of gait and mobility (R26.89);Muscle weakness (generalized) (M62.81);History of falling  (Z91.81);Difficulty in walking, not elsewhere classified (R26.2);Other symptoms and signs involving the nervous system (R29.898);Pain     Time: 1129-1205 PT Time Calculation (min) (ACUTE ONLY): 36 min  Charges:    $Therapeutic Activity: 23-37 mins PT General Charges $$ ACUTE PT VISIT: 1 Visit                      Darice Almas, PT, DPT Acute Rehabilitation Services  Personal: Secure Chat Rehab Office: [phone]  Darice LITTIE Almas 01/13/2024, 1:25 PM

## 2024-01-13 NOTE — Care Management Important Message (Signed)
 Important Message  Patient Details  Name: Peter Bryan MRN: [id number] Date of Birth: 12-May-1960   Important Message Given:  Yes - Medicare IM     Claretta Deed 01/13/2024, 2:49 PM

## 2024-01-13 NOTE — Progress Notes (Signed)
 [facility] Progress Note   Assessment/ Plan:    AKI on CKD IV:             - baseline 3.11 as of 10/2023 - 2/2 hemodynamic insults in setting of septic shock - on CRRT - hyperkalemia and acidosis improving - d/c CRRT today 01/22/24 - will follow up labs to determine if more dialysis is needed   2.  Septic shock:                      - broad spectrum abx and pressors via PCCM             - looks like possible pna LLL   3.  Severe hyperkalemia:             - received temporizing measures             - corrected via CRRT   4.  DM II             - per primary   5.  Dispo: pending  Subjective:    Transferred to floor.  Making great urine.  No labs yet today- have ordered.   Objective:   BP (!) 143/66 (BP Location: Left Arm)   Pulse 79   Temp 99.1 F (37.3 C)   Resp 18   Ht 5' 11 (1.803 m)   Wt 132 kg   SpO2 96%   BMI 40.59 kg/m   Intake/Output Summary (Last 24 hours) at 01/13/2024 1358 Last data filed at 01/13/2024 1237 Gross per 24 hour  Intake 796.8 ml  Output 2070 ml  Net -1273.2 ml   Weight change:   Physical Exam: Physical Exam GEN appears better HEENT eomi perrl NECK + nontunneled HD cath PULM clear CV tachy ABD soft, mildly distended EXT 2+ LE edema NEURO  AAO x 3  Imaging: No results found.   Labs: BMET Recent Labs  Lab 01/10/24 1758 01/10/24 2314 01/11/24 0541 01/11/24 1107 01/11/24 1505 01/12/24 0324  NA 133* 134* 133*  134* 135 135 138  K 6.7* 6.2* 5.6*  5.7* 4.7 4.5 4.2  CL 105 108 106  107  --  104 103  CO2 11* 12* 15*  14*  --  21* 27  GLUCOSE 136* 142* 95  95  --  145* 78  BUN 109* 104* 90*  90*  --  69* 55*  CREATININE 6.97* 6.84* 5.82*  5.80*  --  4.43* 3.18*  CALCIUM  8.6* 8.4* 8.4*  8.2*  --  8.4* 8.7*  PHOS  --  6.5* 5.4*  5.3*  --  4.0 3.1   CBC Recent Labs  Lab 01/10/24 1758 01/10/24 2314 01/11/24 0541 01/11/24 1107  WBC 11.6* 10.6* 7.2  --   NEUTROABS 9.9* 8.6*  --   --   HGB 10.2*  10.2* 9.6* 8.5*  HCT 33.8* 33.0* 30.8* 25.0*  MCV 94.7 94.8 92.8  --   PLT 149* 142* 141*  --     Medications:     Chlorhexidine  Gluconate Cloth  6 each Topical Daily   feeding supplement  237 mL Oral BID BM   gabapentin   100 mg Oral QID   heparin   5,000 Units Subcutaneous Q8H   heparin  sodium (porcine)  1,000 Units Intravenous Once   linaclotide   290 mcg Oral QAC breakfast   multivitamin  1 tablet Oral QHS   mouth rinse  15 mL Mouth Rinse 4 times per day  polyethylene glycol  17 g Oral Daily   senna  1 tablet Oral BID   tamsulosin   0.4 mg Oral Daily   thiamine   100 mg Oral Daily    Almarie Bonine MD 01/13/2024, 1:58 PM

## 2024-01-13 NOTE — Progress Notes (Addendum)
 " PROGRESS NOTE    GIANKARLO LEAMER  FMW:[id number] DOB: 02-Jan-1961 DOA: 01/10/2024 PCP: Shlomo Silvano FERNS, NP   Brief Narrative:  64 year old male with history of HTN, stage IV CKD with recurrent hyperkalemia, DM2, OSA, morbid obesity, multiple back surgeries, chronic pain syndrome, presents to the [facility] ED, brought by the son for altered mental status.  Patient ambulates with a Screws/cane at baseline -confirmed at bedside 1/9.  At intake patient had notably profound hypotension, hyperkalemia and severe metabolic acidosis initially requiring pressors and CRRT.  Given his imaging pneumonia was presumed to be primary etiology for his septic shock.  Now improving.  Off pressors as of 1/8 and transition to hospitalist team as of 1/9.  Assessment & Plan:   Principal Problem:   Shock (HCC) Active Problems:   AKI (acute kidney injury)   Septic shock (HCC)   Septic shock in the setting of left lower lobe pneumonia, presumed - Off pressors as of 1/8; hypothermic with leukocytosis and source or LLL PNA noted - Continue broad-spectrum antibiotics with doxycycline , ceftriaxone  -plan for 5-day course - Continue to follow clinically although respiratory status appears to be resolving over the past 24 hours -patient was questionably transiently hypoxic at intake but weaned to room air within the first few hours  Acute metabolic encephalopathy - Multifactorial in the setting of shock, infection, uremia - Rule out concurrent secondary etiology including medications  AKI on CKD4 Severe metabolic acidosis High anion gap metabolic acidosis -Presumably secondary to shock as above - Improving, CRRT discontinued 1/8, appreciate nephrology insight and recommendations  HTN - Currently holding antihypertensives secondary to above, will resume over the next 48 hours pending vitals  Gout exacerbation ruled out - Uric acid levels within normal limits, no pointed tenderness on exam  DM 2,  non-insulin -dependent, well controlled - A1c well-controlled at 5.4 resume home medications at discharge  BPH -No acute issues, resume home medications once blood pressure normalizes  DVT prophylaxis: heparin  injection 5,000 Units Start: 01/11/24 0600 SCDs Start: 01/10/24 2209 Code Status:   Code Status: Full Code Family Communication: None present  Status is: Inpatient  Dispo: The patient is from: Home              Anticipated d/c is to: To be determined              Anticipated d/c date is: To be determined              Patient currently not medically stable for discharge  Consultants:  Nephrology, PCCM  Procedures:  CRRT, discontinued 1/8  Antimicrobials:  Ceftriaxone , doxycycline  -plan for 5-day course  Subjective: No acute issues or events overnight denies nausea vomiting diarrhea constipation headache fevers chills chest pain, shortness of breath markedly improving but not yet back to baseline.  Objective: Vitals:   01/12/24 1500 01/12/24 1527 01/12/24 2006 01/13/24 0329  BP:  134/68 (!) 132/48 (!) 132/58  Pulse: 90 79 86 83  Resp: (!) 23 17 19 18   Temp: (!) 100.6 F (38.1 C) 98.5 F (36.9 C) 98.5 F (36.9 C) 98.4 F (36.9 C)  TempSrc:   Oral Oral  SpO2: 96% 99% 94% 96%  Weight:      Height:        Intake/Output Summary (Last 24 hours) at 01/13/2024 0753 Last data filed at 01/13/2024 0508 Gross per 24 hour  Intake 1066.8 ml  Output 2768.4 ml  Net -1701.6 ml   Filed Weights   01/10/24 2214 01/11/24  0155 01/12/24 0500  Weight: (!) 137 kg (!) 137 kg 132 kg    Examination:  General: Uncomfortable appearing in bed but no acute distress. HEENT:  Normocephalic atraumatic.  Sclerae nonicteric, noninjected.  Extraocular movements intact bilaterally. Neck: Short, unable to distinguish landmarks Lungs: Diminished without overt wheeze or rales. Heart:  Regular rate and rhythm.  Without murmurs, rubs, or gallops. Abdomen: Obese, nontender,  nontympanic Extremities: Without cyanosis, clubbing, edema, or obvious deformity.  Data Reviewed: I have personally reviewed following labs and imaging studies  CBC: Recent Labs  Lab 01/10/24 1758 01/10/24 2314 01/11/24 0541 01/11/24 1107  WBC 11.6* 10.6* 7.2  --   NEUTROABS 9.9* 8.6*  --   --   HGB 10.2* 10.2* 9.6* 8.5*  HCT 33.8* 33.0* 30.8* 25.0*  MCV 94.7 94.8 92.8  --   PLT 149* 142* 141*  --    Basic Metabolic Panel: Recent Labs  Lab 01/10/24 1758 01/10/24 2314 01/11/24 0541 01/11/24 1107 01/11/24 1505 01/12/24 0324  NA 133* 134* 133*  134* 135 135 138  K 6.7* 6.2* 5.6*  5.7* 4.7 4.5 4.2  CL 105 108 106  107  --  104 103  CO2 11* 12* 15*  14*  --  21* 27  GLUCOSE 136* 142* 95  95  --  145* 78  BUN 109* 104* 90*  90*  --  69* 55*  CREATININE 6.97* 6.84* 5.82*  5.80*  --  4.43* 3.18*  CALCIUM  8.6* 8.4* 8.4*  8.2*  --  8.4* 8.7*  MG  --   --  2.1  --   --  1.8  PHOS  --  6.5* 5.4*  5.3*  --  4.0 3.1   GFR: Estimated Creatinine Clearance: 32.1 mL/min (A) (by C-G formula based on SCr of 3.18 mg/dL (H)). Liver Function Tests: Recent Labs  Lab 01/10/24 1758 01/10/24 2314 01/11/24 0541 01/11/24 1505 01/12/24 0324  AST 11* 11*  --   --   --   ALT 21 18  --   --   --   ALKPHOS 79 72  --   --   --   BILITOT 0.3 0.3  --   --   --   PROT 7.0 6.2*  --   --   --   ALBUMIN 4.4 3.9 3.4* 3.4* 3.4*   Recent Labs  Lab 01/10/24 2314  LIPASE 98*  AMYLASE 91   No results for input(s): AMMONIA in the last 168 hours. Coagulation Profile: Recent Labs  Lab 01/10/24 1758 01/10/24 2314  INR 1.1 1.2   Cardiac Enzymes: No results for input(s): CKTOTAL, CKMB, CKMBINDEX, TROPONINI in the last 168 hours. BNP (last 3 results) Recent Labs    01/10/24 1755 01/10/24 2314  PROBNP 255.0 272.0   HbA1C: Recent Labs    01/12/24 0324  HGBA1C 5.4   CBG: Recent Labs  Lab 01/11/24 1554 01/11/24 2001 01/11/24 2343 01/12/24 0801 01/12/24 1146   GLUCAP 126* 105* 159* 91 124*   Lipid Profile: No results for input(s): CHOL, HDL, LDLCALC, TRIG, CHOLHDL, LDLDIRECT in the last 72 hours. Thyroid  Function Tests: No results for input(s): TSH, T4TOTAL, FREET4, T3FREE, THYROIDAB in the last 72 hours. Anemia Panel: No results for input(s): VITAMINB12, FOLATE, FERRITIN, TIBC, IRON, RETICCTPCT in the last 72 hours. Sepsis Labs: Recent Labs  Lab 01/10/24 1753 01/10/24 2024 01/11/24 0514  LATICACIDVEN 0.5 0.8 0.3*    Recent Results (from the past 240 hours)  Resp panel by RT-PCR (RSV, Flu  A&B, Covid) Anterior Nasal Swab     Status: None   Collection Time: 01/10/24  5:53 PM   Specimen: Anterior Nasal Swab  Result Value Ref Range Status   SARS Coronavirus 2 by RT PCR NEGATIVE NEGATIVE Final    Comment: (NOTE) SARS-CoV-2 target nucleic acids are NOT DETECTED.  The SARS-CoV-2 RNA is generally detectable in upper respiratory specimens during the acute phase of infection. The lowest concentration of SARS-CoV-2 viral copies this assay can detect is 138 copies/mL. A negative result does not preclude SARS-Cov-2 infection and should not be used as the sole basis for treatment or other patient management decisions. A negative result may occur with  improper specimen collection/handling, submission of specimen other than nasopharyngeal swab, presence of viral mutation(s) within the areas targeted by this assay, and inadequate number of viral copies(<138 copies/mL). A negative result must be combined with clinical observations, patient history, and epidemiological information. The expected result is Negative.  Fact Sheet for Patients:  bloggercourse.com  Fact Sheet for Healthcare Providers:  seriousbroker.it  This test is no t yet approved or cleared by the [place]  FDA and  has been authorized for detection and/or diagnosis of SARS-CoV-2 by FDA  under an Emergency Use Authorization (EUA). This EUA will remain  in effect (meaning this test can be used) for the duration of the COVID-19 declaration under Section 564(b)(1) of the Act, 21 U.S.C.section 360bbb-3(b)(1), unless the authorization is terminated  or revoked sooner.       Influenza A by PCR NEGATIVE NEGATIVE Final   Influenza B by PCR NEGATIVE NEGATIVE Final    Comment: (NOTE) The Xpert Xpress SARS-CoV-2/FLU/RSV plus assay is intended as an aid in the diagnosis of influenza from Nasopharyngeal swab specimens and should not be used as a sole basis for treatment. Nasal washings and aspirates are unacceptable for Xpert Xpress SARS-CoV-2/FLU/RSV testing.  Fact Sheet for Patients: bloggercourse.com  Fact Sheet for Healthcare Providers: seriousbroker.it  This test is not yet approved or cleared by the [place]  FDA and has been authorized for detection and/or diagnosis of SARS-CoV-2 by FDA under an Emergency Use Authorization (EUA). This EUA will remain in effect (meaning this test can be used) for the duration of the COVID-19 declaration under Section 564(b)(1) of the Act, 21 U.S.C. section 360bbb-3(b)(1), unless the authorization is terminated or revoked.     Resp Syncytial Virus by PCR NEGATIVE NEGATIVE Final    Comment: (NOTE) Fact Sheet for Patients: bloggercourse.com  Fact Sheet for Healthcare Providers: seriousbroker.it  This test is not yet approved or cleared by the [place]  FDA and has been authorized for detection and/or diagnosis of SARS-CoV-2 by FDA under an Emergency Use Authorization (EUA). This EUA will remain in effect (meaning this test can be used) for the duration of the COVID-19 declaration under Section 564(b)(1) of the Act, 21 U.S.C. section 360bbb-3(b)(1), unless the authorization is terminated or revoked.  Performed at [facility], [address] [place], [place] [address]   Blood Culture (routine x 2)     Status: None (Preliminary result)   Collection Time: 01/10/24  5:53 PM   Specimen: BLOOD  Result Value Ref Range Status   Specimen Description   Final    BLOOD LEFT ANTECUBITAL Performed at [facility], [address] [place], [place] [address]    Special Requests   Final    BOTTLES DRAWN AEROBIC AND ANAEROBIC Blood Culture adequate volume Performed at [facility]  [address] [place], [place] [address]    Culture   Final    NO GROWTH 2 DAYS Performed at [facility], [address], [place], [place] [address]    Report Status PENDING  Incomplete  Blood Culture (routine x 2)     Status: None (Preliminary result)   Collection Time: 01/10/24  6:13 PM   Specimen: BLOOD  Result Value Ref Range Status   Specimen Description   Final    BLOOD RIGHT ANTECUBITAL Performed at [facility], [address] [place], [place] [address]    Special Requests   Final    BOTTLES DRAWN AEROBIC AND ANAEROBIC Blood Culture adequate volume Performed at [facility], [address] [place], [place] [address]    Culture   Final    NO GROWTH 2 DAYS Performed at [facility], [address], [place], [place] [address]    Report Status PENDING  Incomplete  MRSA Next Gen by PCR, Nasal     Status: None   Collection Time: 01/10/24 10:09 PM   Specimen: Nasal Mucosa; Nasal Swab  Result Value Ref Range Status   MRSA by PCR Next Gen NOT DETECTED NOT DETECTED Final    Comment: (NOTE) The GeneXpert MRSA Assay (FDA approved for NASAL specimens only), is one component of a comprehensive MRSA colonization surveillance program. It is not intended to diagnose MRSA infection nor to guide or monitor treatment for MRSA infections. Test performance is not FDA approved in patients less than 74 years old. Performed at [facility], [address],  [place], [place] [address]   Urine Culture     Status: None   Collection Time: 01/10/24 11:58 PM   Specimen: Urine, Random  Result Value Ref Range Status   Specimen Description URINE, RANDOM  Final   Special Requests NONE Reflexed from [id number]  Final   Culture   Final    NO GROWTH Performed at [facility], [address], [place], [place] [address]    Report Status 01/12/2024 FINAL  Final  Culture, blood (Routine X 2) w Reflex to ID Panel     Status: None (Preliminary result)   Collection Time: 01/11/24  8:09 AM   Specimen: BLOOD LEFT HAND  Result Value Ref Range Status   Specimen Description BLOOD LEFT HAND  Final   Special Requests   Final    BOTTLES DRAWN AEROBIC AND ANAEROBIC Blood Culture adequate volume   Culture   Final    NO GROWTH < 24 HOURS Performed at [facility], [address], [place], [place] [address]    Report Status PENDING  Incomplete  Culture, blood (Routine X 2) w Reflex to ID Panel     Status: None (Preliminary result)   Collection Time: 01/11/24  8:15 AM   Specimen: BLOOD RIGHT HAND  Result Value Ref Range Status   Specimen Description BLOOD RIGHT HAND  Final   Special Requests   Final    BOTTLES DRAWN AEROBIC AND ANAEROBIC Blood Culture adequate volume   Culture   Final    NO GROWTH < 24 HOURS Performed at [facility], [address] [place], [place] [address]    Report Status PENDING  Incomplete         Radiology Studies: ECHOCARDIOGRAM COMPLETE Result Date: 01/11/2024    ECHOCARDIOGRAM REPORT   Patient Name:   LUPITA JINNY FINDER Date of Exam:  01/11/2024 Medical Rec #:  [id number]     Height:       71.0 in Accession #:    [id number]    Weight:       302.0 lb Date of Birth:  [birth year]     BSA:          2.512 m Patient Age:    63 years      BP:           114/61 mmHg Patient Gender: M             HR:           69 bpm. Exam Location:  Inpatient Procedure: 2D Echo, Cardiac Doppler and Color Doppler (Both Spectral and Color            Flow  Doppler were utilized during procedure). Indications:    Abnormal ECG  History:        Patient has no prior history of Echocardiogram examinations.                 Risk Factors:Sleep Apnea, Diabetes, Hypertension and                 Dyslipidemia.  Sonographer:    Sherlean Dubin Referring Phys: [id number] ABDULLAHI HUSSEIN  Sonographer Comments: Image acquisition challenging due to patient body habitus and Image acquisition challenging due to respiratory motion. IMPRESSIONS  1. Left ventricular ejection fraction, by estimation, is 60 to 65%. The left ventricle has normal function. The left ventricle has no regional wall motion abnormalities. Left ventricular diastolic parameters are consistent with Grade I diastolic dysfunction (impaired relaxation).  2. Right ventricular systolic function is normal. The right ventricular size is normal. There is normal pulmonary artery systolic pressure.  3. The mitral valve is normal in structure. Mild mitral valve regurgitation. No evidence of mitral stenosis.  4. The aortic valve is tricuspid. There is mild calcification of the aortic valve. Aortic valve regurgitation is not visualized. Aortic valve sclerosis/calcification is present, without any evidence of aortic stenosis.  5. The inferior vena cava is dilated in size with <50% respiratory variability, suggesting right atrial pressure of 15 mmHg. FINDINGS  Left Ventricle: Left ventricular ejection fraction, by estimation, is 60 to 65%. The left ventricle has normal function. The left ventricle has no regional wall motion abnormalities. The left ventricular internal cavity size was normal in size. There is  no left ventricular hypertrophy. Left ventricular diastolic parameters are consistent with Grade I diastolic dysfunction (impaired relaxation). Right Ventricle: The right ventricular size is normal. No increase in right ventricular wall thickness. Right ventricular systolic function is normal. There is normal pulmonary artery  systolic pressure. The tricuspid regurgitant velocity is 1.23 m/s, and  with an assumed right atrial pressure of 10 mmHg, the estimated right ventricular systolic pressure is 16.1 mmHg. Left Atrium: Left atrial size was normal in size. Right Atrium: Right atrial size was normal in size. Pericardium: There is no evidence of pericardial effusion. Mitral Valve: The mitral valve is normal in structure. Mild mitral valve regurgitation. No evidence of mitral valve stenosis. MV peak gradient, 6.4 mmHg. The mean mitral valve gradient is 3.0 mmHg. Tricuspid Valve: The tricuspid valve is normal in structure. Tricuspid valve regurgitation is trivial. No evidence of tricuspid stenosis. Aortic Valve: The aortic valve is tricuspid. There is mild calcification of the aortic valve. Aortic valve regurgitation is not visualized. Aortic valve sclerosis/calcification is present, without any evidence of aortic stenosis. Aortic valve mean gradient measures  5.5 mmHg. Aortic valve peak gradient measures 10.8 mmHg. Aortic valve area, by VTI measures 2.79 cm. Pulmonic Valve: The pulmonic valve was normal in structure. Pulmonic valve regurgitation is not visualized. No evidence of pulmonic stenosis. Aorta: The aortic root is normal in size and structure. Venous: The inferior vena cava is dilated in size with less than 50% respiratory variability, suggesting right atrial pressure of 15 mmHg. IAS/Shunts: No atrial level shunt detected by color flow Doppler.  LEFT VENTRICLE PLAX 2D LVIDd:         5.90 cm   Diastology LVIDs:         4.30 cm   LV e' medial:    6.22 cm/s LV PW:         1.30 cm   LV E/e' medial:  13.3 LV IVS:        0.80 cm   LV e' lateral:   9.32 cm/s LVOT diam:     2.10 cm   LV E/e' lateral: 8.9 LV SV:         81 LV SV Index:   32 LVOT Area:     3.46 cm  RIGHT VENTRICLE             IVC RV Basal diam:  3.90 cm     IVC diam: 2.30 cm RV Mid diam:    3.40 cm RV S prime:     15.20 cm/s TAPSE (M-mode): 1.8 cm LEFT ATRIUM            Index        RIGHT ATRIUM           Index LA diam:      4.20 cm 1.67 cm/m   RA Area:     16.20 cm LA Vol (A2C): 46.4 ml 18.47 ml/m  RA Volume:   40.40 ml  16.08 ml/m LA Vol (A4C): 51.7 ml 20.58 ml/m  AORTIC VALVE AV Area (Vmax):    2.75 cm AV Area (Vmean):   2.66 cm AV Area (VTI):     2.79 cm AV Vmax:           164.00 cm/s AV Vmean:          109.500 cm/s AV VTI:            0.292 m AV Peak Grad:      10.8 mmHg AV Mean Grad:      5.5 mmHg LVOT Vmax:         130.00 cm/s LVOT Vmean:        84.250 cm/s LVOT VTI:          0.234 m LVOT/AV VTI ratio: 0.80  AORTA Ao Root diam: 3.50 cm Ao Asc diam:  3.50 cm MITRAL VALVE                TRICUSPID VALVE MV Area (PHT): 3.27 cm     TR Peak grad:   6.1 mmHg MV Area VTI:   1.96 cm     TR Vmax:        123.00 cm/s MV Peak grad:  6.4 mmHg MV Mean grad:  3.0 mmHg     SHUNTS MV Vmax:       1.26 m/s     Systemic VTI:  0.23 m MV Vmean:      74.8 cm/s    Systemic Diam: 2.10 cm MV Decel Time: 232 msec MR Peak grad: 15.1 mmHg MR Vmax:      194.00 cm/s MV E velocity:  82.60 cm/s MV A velocity: 118.00 cm/s MV E/A ratio:  0.70 Toribio Fuel MD Electronically signed by Toribio Fuel MD Signature Date/Time: 01/11/2024/3:45:59 PM    Final     Scheduled Meds:  Chlorhexidine  Gluconate Cloth  6 each Topical Daily   feeding supplement  237 mL Oral BID BM   gabapentin   100 mg Oral QID   heparin   5,000 Units Subcutaneous Q8H   heparin  sodium (porcine)  1,000 Units Intravenous Once   linaclotide   290 mcg Oral QAC breakfast   multivitamin  1 tablet Oral QHS   mouth rinse  15 mL Mouth Rinse 4 times per day   polyethylene glycol  17 g Oral Daily   senna  1 tablet Oral BID   tamsulosin   0.4 mg Oral Daily   thiamine   100 mg Oral Daily   Continuous Infusions:  cefTRIAXone  (ROCEPHIN )  IV Stopped (01/12/24 1349)   doxycycline  (VIBRAMYCIN ) IV 100 mg (01/13/24 0326)     LOS: 3 days   Time spent:  Elsie JAYSON Montclair, DO [facility]  If 7PM-7AM, please contact  night-coverage [email or web address]  01/13/2024, 7:53 AM      "

## 2024-01-14 DIAGNOSIS — R579 Shock, unspecified: Secondary | ICD-10-CM | POA: Diagnosis not present

## 2024-01-14 LAB — RENAL FUNCTION PANEL
Albumin: 3.5 g/dL (ref 3.5–5.0)
Anion gap: 9 (ref 5–15)
BUN: 59 mg/dL — ABNORMAL HIGH (ref 8–23)
CO2: 26 mmol/L (ref 22–32)
Calcium: 8.6 mg/dL — ABNORMAL LOW (ref 8.9–10.3)
Chloride: 103 mmol/L (ref 98–111)
Creatinine, Ser: 3.02 mg/dL — ABNORMAL HIGH (ref 0.61–1.24)
GFR, Estimated: 22 mL/min — ABNORMAL LOW
Glucose, Bld: 178 mg/dL — ABNORMAL HIGH (ref 70–99)
Phosphorus: 2.2 mg/dL — ABNORMAL LOW (ref 2.5–4.6)
Potassium: 4.4 mmol/L (ref 3.5–5.1)
Sodium: 138 mmol/L (ref 135–145)

## 2024-01-14 LAB — CBC
HCT: 26.8 % — ABNORMAL LOW (ref 39.0–52.0)
Hemoglobin: 8.4 g/dL — ABNORMAL LOW (ref 13.0–17.0)
MCH: 28.8 pg (ref 26.0–34.0)
MCHC: 31.3 g/dL (ref 30.0–36.0)
MCV: 91.8 fL (ref 80.0–100.0)
Platelets: 120 K/uL — ABNORMAL LOW (ref 150–400)
RBC: 2.92 MIL/uL — ABNORMAL LOW (ref 4.22–5.81)
RDW: 13.4 % (ref 11.5–15.5)
WBC: 11.7 K/uL — ABNORMAL HIGH (ref 4.0–10.5)
nRBC: 0 % (ref 0.0–0.2)

## 2024-01-14 MED ORDER — SODIUM CHLORIDE 0.9% FLUSH
10.0000 mL | Freq: Two times a day (BID) | INTRAVENOUS | Status: DC
  Administered 2024-01-14 – 2024-01-29 (×22): 10 mL

## 2024-01-14 MED ORDER — SODIUM CHLORIDE 0.9% FLUSH: 10.0000 mL | INTRAVENOUS | Status: DC | PRN

## 2024-01-14 MED ORDER — IRBESARTAN 300 MG PO TABS
300.0000 mg | ORAL_TABLET | Freq: Every day | ORAL | Status: DC
  Administered 2024-01-14 – 2024-01-19 (×6): 300 mg via ORAL
  Filled 2024-01-14 (×6): qty 1

## 2024-01-14 MED ORDER — HYDROCHLOROTHIAZIDE 25 MG PO TABS
25.0000 mg | ORAL_TABLET | Freq: Every day | ORAL | Status: DC
  Administered 2024-01-14: 25 mg via ORAL
  Filled 2024-01-14: qty 1

## 2024-01-14 MED ORDER — OXYCODONE HCL 5 MG PO TABS
5.0000 mg | ORAL_TABLET | ORAL | Status: DC | PRN
  Administered 2024-01-14 (×2): 5 mg via ORAL
  Filled 2024-01-14 (×3): qty 1

## 2024-01-14 NOTE — Progress Notes (Signed)
" °   01/14/24 2305  BiPAP/CPAP/SIPAP  BiPAP/CPAP/SIPAP Pt Type Adult (Pt places himself on home unit. RT will assist as needed.)  BiPAP/CPAP/SIPAP Resmed  Mask Type Full face mask  Respiratory Rate 18 breaths/min  FiO2 (%) 21 %  Patient Home Machine Yes  Safety Check Completed by RT for Home Unit Yes, no issues noted  Patient Home Mask Yes  Patient Home Tubing Yes  Device Plugged into RED Power Outlet Yes    "

## 2024-01-14 NOTE — Progress Notes (Signed)
  [facility] Progress Note   Assessment/ Plan:    AKI on CKD IV:             - baseline 3.11 as of 10/2023 - 2/2 hemodynamic insults in setting of septic shock - on CRRT - hyperkalemia and acidosis improving - d/c CRRT 01/12/24 - stable, doesn't need anymore dialysis- d/c nontunneled HD cath and the Foley   2.  Septic shock:                      - broad spectrum abx and pressors via PCCM             - looks like possible pna LLL   3.  Severe hyperkalemia:             - received temporizing measures             - corrected via CRRT  - resolved   4.  DM II             - per primary   5.  Dispo: pending  Subjective:    Making urine, Cr stable,  Doesn't need anymore dialysis.  Will d/c nontunneled HD cath and will also d/c foley.   Objective:   BP (!) 161/63   Pulse 84   Temp 98.9 F (37.2 C) (Oral)   Resp 18   Ht 5' 11 (1.803 m)   Wt 132 kg   SpO2 91%   BMI 40.59 kg/m   Intake/Output Summary (Last 24 hours) at 01/14/2024 1348 Last data filed at 01/14/2024 0800 Gross per 24 hour  Intake 698 ml  Output 1701 ml  Net -1003 ml   Weight change:   Physical Exam: Physical Exam GEN appears better HEENT eomi perrl NECK + nontunneled HD cath PULM clear CV tachy ABD soft, mildly distended EXT 2+ LE edema NEURO  AAO x 3  Imaging: No results found.   Labs: BMET Recent Labs  Lab 01/10/24 1758 01/10/24 2314 01/11/24 0541 01/11/24 1107 01/11/24 1505 01/12/24 0324 01/13/24 1418 01/14/24 1030  NA 133* 134* 133*  134* 135 135 138 141 138  K 6.7* 6.2* 5.6*  5.7* 4.7 4.5 4.2 4.3 4.4  CL 105 108 106  107  --  104 103 104 103  CO2 11* 12* 15*  14*  --  21* 27 27 26   GLUCOSE 136* 142* 95  95  --  145* 78 153* 178*  BUN 109* 104* 90*  90*  --  69* 55* 55* 59*  CREATININE 6.97* 6.84* 5.82*  5.80*  --  4.43* 3.18* 3.05* 3.02*  CALCIUM  8.6* 8.4* 8.4*  8.2*  --  8.4* 8.7* 8.8* 8.6*  PHOS  --  6.5* 5.4*  5.3*  --  4.0 3.1 2.1* 2.2*    CBC Recent Labs  Lab 01/10/24 1758 01/10/24 2314 01/11/24 0541 01/11/24 1107 01/13/24 1418 01/14/24 1030  WBC 11.6* 10.6* 7.2  --  10.9* 11.7*  NEUTROABS 9.9* 8.6*  --   --   --   --   HGB 10.2* 10.2* 9.6* 8.5* 9.1* 8.4*  HCT 33.8* 33.0* 30.8* 25.0* 28.3* 26.8*  MCV 94.7 94.8 92.8  --  91.0 91.8  PLT 149* 142* 141*  --  124* 120*    Medications:     Chlorhexidine  Gluconate Cloth  6 each Topical Daily   feeding supplement  237 mL Oral BID BM   gabapentin   100 mg Oral QID  heparin   5,000 Units Subcutaneous Q8H   heparin  sodium (porcine)  1,000 Units Intravenous Once   irbesartan   300 mg Oral Daily   linaclotide   290 mcg Oral QAC breakfast   multivitamin  1 tablet Oral QHS   mouth rinse  15 mL Mouth Rinse 4 times per day   polyethylene glycol  17 g Oral Daily   senna  1 tablet Oral BID   sodium chloride  flush  10-40 mL Intracatheter Q12H   tamsulosin   0.4 mg Oral Daily   thiamine   100 mg Oral Daily    Almarie Bonine MD 01/14/2024, 1:48 PM

## 2024-01-14 NOTE — Progress Notes (Signed)
 " PROGRESS NOTE    Peter Bryan  FMW:[id number] DOB: September 21, 1960 DOA: 01/10/2024 PCP: Shlomo Silvano FERNS, NP   Brief Narrative:  64 year old male with history of HTN, stage IV CKD with recurrent hyperkalemia, DM2, OSA, morbid obesity, multiple back surgeries, chronic pain syndrome, presents to the [facility] ED, brought by the son for altered mental status.  Patient ambulates with a Kyer/cane at baseline -confirmed at bedside 1/9.  At intake patient had notably profound hypotension, hyperkalemia and severe metabolic acidosis initially requiring pressors and CRRT.  Given his imaging pneumonia was presumed to be primary etiology for his septic shock.  Now improving.  Off pressors as of 1/8 and transition to hospitalist team as of 1/9.  Assessment & Plan:   Principal Problem:   Shock (HCC) Active Problems:   AKI (acute kidney injury)   Septic shock (HCC)  Septic shock in the setting of left lower lobe pneumonia, presumed - Off pressors as of 1/8; hypothermic with leukocytosis and source or LLL PNA noted - Continue broad-spectrum antibiotics with doxycycline , ceftriaxone  -plan for 5-day course - Continue to follow clinically although respiratory status appears to be resolving over the past 24 hours -patient was questionably transiently hypoxic at intake but weaned to room air within the first few hours  Acute metabolic encephalopathy - Multifactorial in the setting of shock, infection, uremia - Rule out concurrent secondary etiology including medications  AKI on CKD4, resolved Severe metabolic acidosis High anion gap metabolic acidosis - Presumably secondary to shock as above - Improving, CRRT discontinued 1/8, appreciate nephrology insight and recommendations - Creatinine appears to be back to baseline  HTN - Now elevated, resume home medications given creatinine back to baseline  Gout exacerbation ruled out - Uric acid levels within normal limits, no pointed tenderness on  exam  DM 2, non-insulin -dependent, well controlled - A1c well-controlled at 5.4 resume home medications at discharge  BPH - No acute issues, resume home medications once blood pressure normalizes  DVT prophylaxis: heparin  injection 5,000 Units Start: 01/11/24 0600 SCDs Start: 01/10/24 2209 Code Status:   Code Status: Full Code Family Communication: Son at bedside, updated daughter-in-law over the phone  Status is: Inpatient  Dispo: The patient is from: Home              Anticipated d/c is to: To be determined pending PT evaluation              Anticipated d/c date is: To be determined              Patient currently not medically stable for discharge  Consultants:  Nephrology, PCCM  Procedures:  CRRT, off since 1/8  Antimicrobials:  Ceftriaxone , doxycycline  -plan for 5-day course  Subjective: No acute issues or events overnight denies nausea vomiting diarrhea constipation headache fevers chills chest pain -patient feeling markedly improved today compared to yesterday, son at bedside confirms he does appear improved.  Objective: Vitals:   01/13/24 1621 01/13/24 2041 01/14/24 0523 01/14/24 0814  BP: (!) 140/67 (!) 145/58 (!) 142/62 (!) 161/63  Pulse: 83 85 83 84  Resp: 18 18 18    Temp: 99.5 F (37.5 C) 99.4 F (37.4 C) 99.5 F (37.5 C) (!) 102.7 F (39.3 C)  TempSrc:      SpO2: 97% 93% 92% 91%  Weight:      Height:        Intake/Output Summary (Last 24 hours) at 01/14/2024 0816 Last data filed at 01/14/2024 0600 Gross per 24 hour  Intake 920 ml  Output 2051 ml  Net -1131 ml   Filed Weights   01/10/24 2214 01/11/24 0155 01/12/24 0500  Weight: (!) 137 kg (!) 137 kg 132 kg    Examination:  General: Resting comfortably in bed no acute distress HEENT:  Normocephalic atraumatic.  Sclerae nonicteric, noninjected.  Extraocular movements intact bilaterally. Neck: Short, unable to distinguish landmarks Lungs: Diminished without overt wheeze or rales. Heart:   Regular rate and rhythm.  Without murmurs, rubs, or gallops. Abdomen: Obese, nontender, nontympanic Extremities: Without cyanosis, clubbing, edema, or obvious deformity.  Data Reviewed: I have personally reviewed following labs and imaging studies  CBC: Recent Labs  Lab 01/10/24 1758 01/10/24 2314 01/11/24 0541 01/11/24 1107 01/13/24 1418  WBC 11.6* 10.6* 7.2  --  10.9*  NEUTROABS 9.9* 8.6*  --   --   --   HGB 10.2* 10.2* 9.6* 8.5* 9.1*  HCT 33.8* 33.0* 30.8* 25.0* 28.3*  MCV 94.7 94.8 92.8  --  91.0  PLT 149* 142* 141*  --  124*   Basic Metabolic Panel: Recent Labs  Lab 01/10/24 2314 01/11/24 0541 01/11/24 1107 01/11/24 1505 01/12/24 0324 01/13/24 1418  NA 134* 133*  134* 135 135 138 141  K 6.2* 5.6*  5.7* 4.7 4.5 4.2 4.3  CL 108 106  107  --  104 103 104  CO2 12* 15*  14*  --  21* 27 27  GLUCOSE 142* 95  95  --  145* 78 153*  BUN 104* 90*  90*  --  69* 55* 55*  CREATININE 6.84* 5.82*  5.80*  --  4.43* 3.18* 3.05*  CALCIUM  8.4* 8.4*  8.2*  --  8.4* 8.7* 8.8*  MG  --  2.1  --   --  1.8  --   PHOS 6.5* 5.4*  5.3*  --  4.0 3.1 2.1*   GFR: Estimated Creatinine Clearance: 33.5 mL/min (A) (by C-G formula based on SCr of 3.05 mg/dL (H)). Liver Function Tests: Recent Labs  Lab 01/10/24 1758 01/10/24 2314 01/11/24 0541 01/11/24 1505 01/12/24 0324 01/13/24 1418  AST 11* 11*  --   --   --   --   ALT 21 18  --   --   --   --   ALKPHOS 79 72  --   --   --   --   BILITOT 0.3 0.3  --   --   --   --   PROT 7.0 6.2*  --   --   --   --   ALBUMIN 4.4 3.9 3.4* 3.4* 3.4* 3.6   Recent Labs  Lab 01/10/24 2314  LIPASE 98*  AMYLASE 91   Coagulation Profile: Recent Labs  Lab 01/10/24 1758 01/10/24 2314  INR 1.1 1.2   BNP (last 3 results) Recent Labs    01/10/24 1755 01/10/24 2314  PROBNP 255.0 272.0   HbA1C: Recent Labs    01/12/24 0324  HGBA1C 5.4   CBG: Recent Labs  Lab 01/11/24 1554 01/11/24 2001 01/11/24 2343 01/12/24 0801 01/12/24 1146   GLUCAP 126* 105* 159* 91 124*   Sepsis Labs: Recent Labs  Lab 01/10/24 1753 01/10/24 2024 01/11/24 0514  LATICACIDVEN 0.5 0.8 0.3*    Recent Results (from the past 240 hours)  Resp panel by RT-PCR (RSV, Flu A&B, Covid) Anterior Nasal Swab     Status: None   Collection Time: 01/10/24  5:53 PM   Specimen: Anterior Nasal Swab  Result Value Ref Range Status  SARS Coronavirus 2 by RT PCR NEGATIVE NEGATIVE Final    Comment: (NOTE) SARS-CoV-2 target nucleic acids are NOT DETECTED.  The SARS-CoV-2 RNA is generally detectable in upper respiratory specimens during the acute phase of infection. The lowest concentration of SARS-CoV-2 viral copies this assay can detect is 138 copies/mL. A negative result does not preclude SARS-Cov-2 infection and should not be used as the sole basis for treatment or other patient management decisions. A negative result may occur with  improper specimen collection/handling, submission of specimen other than nasopharyngeal swab, presence of viral mutation(s) within the areas targeted by this assay, and inadequate number of viral copies(<138 copies/mL). A negative result must be combined with clinical observations, patient history, and epidemiological information. The expected result is Negative.  Fact Sheet for Patients:  bloggercourse.com  Fact Sheet for Healthcare Providers:  seriousbroker.it  This test is no t yet approved or cleared by the [place]  FDA and  has been authorized for detection and/or diagnosis of SARS-CoV-2 by FDA under an Emergency Use Authorization (EUA). This EUA will remain  in effect (meaning this test can be used) for the duration of the COVID-19 declaration under Section 564(b)(1) of the Act, 21 U.S.C.section 360bbb-3(b)(1), unless the authorization is terminated  or revoked sooner.       Influenza A by PCR NEGATIVE NEGATIVE Final   Influenza B by PCR NEGATIVE  NEGATIVE Final    Comment: (NOTE) The Xpert Xpress SARS-CoV-2/FLU/RSV plus assay is intended as an aid in the diagnosis of influenza from Nasopharyngeal swab specimens and should not be used as a sole basis for treatment. Nasal washings and aspirates are unacceptable for Xpert Xpress SARS-CoV-2/FLU/RSV testing.  Fact Sheet for Patients: bloggercourse.com  Fact Sheet for Healthcare Providers: seriousbroker.it  This test is not yet approved or cleared by the [place]  FDA and has been authorized for detection and/or diagnosis of SARS-CoV-2 by FDA under an Emergency Use Authorization (EUA). This EUA will remain in effect (meaning this test can be used) for the duration of the COVID-19 declaration under Section 564(b)(1) of the Act, 21 U.S.C. section 360bbb-3(b)(1), unless the authorization is terminated or revoked.     Resp Syncytial Virus by PCR NEGATIVE NEGATIVE Final    Comment: (NOTE) Fact Sheet for Patients: bloggercourse.com  Fact Sheet for Healthcare Providers: seriousbroker.it  This test is not yet approved or cleared by the [place]  FDA and has been authorized for detection and/or diagnosis of SARS-CoV-2 by FDA under an Emergency Use Authorization (EUA). This EUA will remain in effect (meaning this test can be used) for the duration of the COVID-19 declaration under Section 564(b)(1) of the Act, 21 U.S.C. section 360bbb-3(b)(1), unless the authorization is terminated or revoked.  Performed at [facility], [address] [place], [place] [address]   Blood Culture (routine x 2)     Status: None (Preliminary result)   Collection Time: 01/10/24  5:53 PM   Specimen: BLOOD  Result Value Ref Range Status   Specimen Description   Final    BLOOD LEFT ANTECUBITAL Performed at [facility], [address] [place], [place] [address]    Special  Requests   Final    BOTTLES DRAWN AEROBIC AND ANAEROBIC Blood Culture adequate volume Performed at [facility], [address] [place], [place] [address]    Culture   Final    NO GROWTH 3 DAYS Performed at [facility], [address] [place],  [place] [address]    Report Status PENDING  Incomplete  Blood Culture (routine x 2)     Status: None (Preliminary result)   Collection Time: 01/10/24  6:13 PM   Specimen: BLOOD  Result Value Ref Range Status   Specimen Description   Final    BLOOD RIGHT ANTECUBITAL Performed at [facility], [address] [place], [place] [address]    Special Requests   Final    BOTTLES DRAWN AEROBIC AND ANAEROBIC Blood Culture adequate volume Performed at [facility], [address] [place], [place] [address]    Culture   Final    NO GROWTH 3 DAYS Performed at [facility], [address] [place], [place] [address]    Report Status PENDING  Incomplete  MRSA Next Gen by PCR, Nasal     Status: None   Collection Time: 01/10/24 10:09 PM   Specimen: Nasal Mucosa; Nasal Swab  Result Value Ref Range Status   MRSA by PCR Next Gen NOT DETECTED NOT DETECTED Final    Comment: (NOTE) The GeneXpert MRSA Assay (FDA approved for NASAL specimens only), is one component of a comprehensive MRSA colonization surveillance program. It is not intended to diagnose MRSA infection nor to guide or monitor treatment for MRSA infections. Test performance is not FDA approved in patients less than 71 years old. Performed at [facility], [address] [place], [place] [address]   Urine Culture     Status: None   Collection Time: 01/10/24 11:58 PM   Specimen: Urine, Random  Result Value Ref Range Status   Specimen Description URINE, RANDOM  Final   Special Requests NONE Reflexed from [id number]  Final   Culture   Final    NO GROWTH Performed at [facility], [address], [place], [place] [address]    Report  Status 01/12/2024 FINAL  Final  Culture, blood (Routine X 2) w Reflex to ID Panel     Status: None (Preliminary result)   Collection Time: 01/11/24  8:09 AM   Specimen: BLOOD LEFT HAND  Result Value Ref Range Status   Specimen Description BLOOD LEFT HAND  Final   Special Requests   Final    BOTTLES DRAWN AEROBIC AND ANAEROBIC Blood Culture adequate volume   Culture   Final    NO GROWTH 2 DAYS Performed at [facility], [address] [place], [place] [address]    Report Status PENDING  Incomplete  Culture, blood (Routine X 2) w Reflex to ID Panel     Status: None (Preliminary result)   Collection Time: 01/11/24  8:15 AM   Specimen: BLOOD RIGHT HAND  Result Value Ref Range Status   Specimen Description BLOOD RIGHT HAND  Final   Special Requests   Final    BOTTLES DRAWN AEROBIC AND ANAEROBIC Blood Culture adequate volume   Culture   Final    NO GROWTH 2 DAYS Performed at [facility], [address], [place], [place] [address]    Report Status PENDING  Incomplete         Radiology Studies: No results found.   Scheduled Meds:  Chlorhexidine  Gluconate Cloth  6 each Topical Daily   feeding supplement  237 mL Oral BID BM   gabapentin   100 mg Oral QID   heparin   5,000 Units Subcutaneous Q8H   heparin  sodium (porcine)  1,000 Units Intravenous Once   hydrochlorothiazide   25  mg Oral Daily   irbesartan   300 mg Oral Daily   linaclotide   290 mcg Oral QAC breakfast   multivitamin  1 tablet Oral QHS   mouth rinse  15 mL Mouth Rinse 4 times per day   polyethylene glycol  17 g Oral Daily   senna  1 tablet Oral BID   tamsulosin   0.4 mg Oral Daily   thiamine   100 mg Oral Daily   Continuous Infusions:  cefTRIAXone  (ROCEPHIN )  IV 2 g (01/13/24 1532)   doxycycline  (VIBRAMYCIN ) IV 100 mg (01/14/24 0354)     LOS: 4 days   Time spent:  Elsie JAYSON Montclair, DO [facility]  If 7PM-7AM, please contact night-coverage [email or web address]  01/14/2024, 8:16 AM       "

## 2024-01-15 DIAGNOSIS — R579 Shock, unspecified: Secondary | ICD-10-CM | POA: Diagnosis not present

## 2024-01-15 LAB — CULTURE, BLOOD (ROUTINE X 2)
Culture: NO GROWTH
Culture: NO GROWTH
Special Requests: ADEQUATE
Special Requests: ADEQUATE

## 2024-01-15 MED ORDER — HYDROCODONE-ACETAMINOPHEN 5-325 MG PO TABS
1.0000 | ORAL_TABLET | Freq: Four times a day (QID) | ORAL | Status: DC | PRN
  Administered 2024-01-15 – 2024-01-30 (×42): 1 via ORAL
  Filled 2024-01-15 (×44): qty 1

## 2024-01-15 NOTE — Plan of Care (Signed)
 Pt family approached nurses station requesting that his daughter be let up on the floor. States that he needs a lot more than everyone else at 2030 on 01/14/2024. States  she is in the ED and they wont let her up. Explained that visiting hours were over at 8 pm and he would be taken care of. Family asked again, request was denied. Pt family member became upset and stated that  I dont care about your other patients, he is different and he requires a different type of care. She needs to come up here and get everything right. She is pregnant and wont be spending the night.Family became agitated and left, care of pt resumed

## 2024-01-15 NOTE — Progress Notes (Signed)
" °   01/15/24 2050  BiPAP/CPAP/SIPAP  BiPAP/CPAP/SIPAP Pt Type Adult  BiPAP/CPAP/SIPAP  (home CPAP)  Mask Type Full face mask  Respiratory Rate 18 breaths/min  FiO2 (%) 21 %  Peak Inspiratory Pressure (PIP) 25  Patient Home Machine Yes  Safety Check Completed by RT for Home Unit Yes, no issues noted  Patient Home Mask Yes  Patient Home Tubing Yes  Auto Titrate Yes     Pt placed on home CPAP. "

## 2024-01-15 NOTE — Progress Notes (Signed)
 " PROGRESS NOTE    Peter Bryan  FMW:[id number] DOB: [birth year] DOA: 01/10/2024 PCP: Shlomo Silvano FERNS, NP   Brief Narrative:  64 year old male with history of HTN, stage IV CKD with recurrent hyperkalemia, DM2, OSA, morbid obesity, multiple back surgeries, chronic pain syndrome, presents to the [facility] ED, brought by the son for altered mental status.  Patient ambulates with a Gorka/cane at baseline -confirmed at bedside 1/9.  At intake patient had notably profound hypotension, hyperkalemia and severe metabolic acidosis initially requiring pressors and CRRT.  Given his imaging pneumonia was presumed to be primary etiology for his septic shock.  Now improving.  Off pressors as of 1/8 and transition to hospitalist team as of 1/9.  Assessment & Plan:   Principal Problem:   Shock (HCC) Active Problems:   AKI (acute kidney injury)   Septic shock (HCC)  Septic shock in the setting of left lower lobe pneumonia, presumed - Off pressors as of 1/8; hypothermic with leukocytosis and source or LLL PNA noted - Continue broad-spectrum antibiotics with doxycycline , ceftriaxone  -plan for 5-day course - Continue to follow clinically although respiratory status appears to be resolving over the past 24 hours -patient was questionably transiently hypoxic at intake but weaned to room air within the first few hours  Acute metabolic encephalopathy - Multifactorial in the setting of shock, infection, uremia - Rule out concurrent secondary etiology including medications  AKI on CKD4, resolved Severe metabolic acidosis High anion gap metabolic acidosis - Presumably secondary to shock as above - Improving, CRRT discontinued 1/8, appreciate nephrology insight and recommendations - Creatinine appears to be back to baseline  HTN - Improving with resumption of home medication   Gout exacerbation ruled out - Uric acid levels within normal limits, no pointed tenderness on exam  DM 2,  non-insulin -dependent, well controlled - A1c well-controlled at 5.4 resume home medications at discharge  BPH - No acute issues, resume home medications once blood pressure normalizes  DVT prophylaxis: heparin  injection 5,000 Units Start: 01/11/24 0600 SCDs Start: 01/10/24 2209 Code Status:   Code Status: Full Code Family Communication: Son at bedside, updated daughter-in-law over the phone  Status is: Inpatient  Dispo: The patient is from: Home              Anticipated d/c is to: To be determined pending PT evaluation              Anticipated d/c date is: To be determined              Patient currently not medically stable for discharge  Consultants:  Nephrology, PCCM  Procedures:  CRRT, off since 1/8  Antimicrobials:  Ceftriaxone , doxycycline  -plan for 5-day course  Subjective: No acute issues or events overnight -reports poor sleep over the past few days but otherwise denies nausea vomiting diarrhea constipation headache fevers chills or chest pain.  Appetite appears to be improving over the past few days as well.  Objective: Vitals:   01/14/24 1610 01/14/24 2049 01/14/24 2305 01/15/24 0635  BP: (!) 141/48 (!) 152/68  (!) 159/62  Pulse: 79 82  73  Resp: 18 14 19 18   Temp: 98.2 F (36.8 C) 98 F (36.7 C)  99.1 F (37.3 C)  TempSrc:  Oral    SpO2: 91% 92%  90%  Weight:      Height:        Intake/Output Summary (Last 24 hours) at 01/15/2024 0820 Last data filed at 01/15/2024 0500 Gross per 24 hour  Intake --  Output 1200 ml  Net -1200 ml   Filed Weights   01/10/24 2214 01/11/24 0155 01/12/24 0500  Weight: (!) 137 kg (!) 137 kg 132 kg    Examination:  General: Resting comfortably in bed no acute distress HEENT:  Normocephalic atraumatic.  Sclerae nonicteric, noninjected.  Extraocular movements intact bilaterally. Neck: Short, unable to distinguish landmarks Lungs: Diminished without overt wheeze or rales. Heart:  Regular rate and rhythm.  Without murmurs,  rubs, or gallops. Abdomen: Obese, nontender, nontympanic Extremities: Without cyanosis, clubbing, edema, or obvious deformity.  Data Reviewed: I have personally reviewed following labs and imaging studies  CBC: Recent Labs  Lab 01/10/24 1758 01/10/24 2314 01/11/24 0541 01/11/24 1107 01/13/24 1418 01/14/24 1030  WBC 11.6* 10.6* 7.2  --  10.9* 11.7*  NEUTROABS 9.9* 8.6*  --   --   --   --   HGB 10.2* 10.2* 9.6* 8.5* 9.1* 8.4*  HCT 33.8* 33.0* 30.8* 25.0* 28.3* 26.8*  MCV 94.7 94.8 92.8  --  91.0 91.8  PLT 149* 142* 141*  --  124* 120*   Basic Metabolic Panel: Recent Labs  Lab 01/11/24 0541 01/11/24 1107 01/11/24 1505 01/12/24 0324 01/13/24 1418 01/14/24 1030  NA 133*  134* 135 135 138 141 138  K 5.6*  5.7* 4.7 4.5 4.2 4.3 4.4  CL 106  107  --  104 103 104 103  CO2 15*  14*  --  21* 27 27 26   GLUCOSE 95  95  --  145* 78 153* 178*  BUN 90*  90*  --  69* 55* 55* 59*  CREATININE 5.82*  5.80*  --  4.43* 3.18* 3.05* 3.02*  CALCIUM  8.4*  8.2*  --  8.4* 8.7* 8.8* 8.6*  MG 2.1  --   --  1.8  --   --   PHOS 5.4*  5.3*  --  4.0 3.1 2.1* 2.2*   GFR: Estimated Creatinine Clearance: 33.8 mL/min (A) (by C-G formula based on SCr of 3.02 mg/dL (H)). Liver Function Tests: Recent Labs  Lab 01/10/24 1758 01/10/24 2314 01/11/24 0541 01/11/24 1505 01/12/24 0324 01/13/24 1418 01/14/24 1030  AST 11* 11*  --   --   --   --   --   ALT 21 18  --   --   --   --   --   ALKPHOS 79 72  --   --   --   --   --   BILITOT 0.3 0.3  --   --   --   --   --   PROT 7.0 6.2*  --   --   --   --   --   ALBUMIN 4.4 3.9 3.4* 3.4* 3.4* 3.6 3.5   Recent Labs  Lab 01/10/24 2314  LIPASE 98*  AMYLASE 91   Coagulation Profile: Recent Labs  Lab 01/10/24 1758 01/10/24 2314  INR 1.1 1.2   BNP (last 3 results) Recent Labs    01/10/24 1755 01/10/24 2314  PROBNP 255.0 272.0   HbA1C: No results for input(s): HGBA1C in the last 72 hours.  CBG: Recent Labs  Lab 01/11/24 1554  01/11/24 2001 01/11/24 2343 01/12/24 0801 01/12/24 1146  GLUCAP 126* 105* 159* 91 124*   Sepsis Labs: Recent Labs  Lab 01/10/24 1753 01/10/24 2024 01/11/24 0514  LATICACIDVEN 0.5 0.8 0.3*    Recent Results (from the past 240 hours)  Resp panel by RT-PCR (RSV, Flu A&B, Covid) Anterior Nasal Swab  Status: None   Collection Time: 01/10/24  5:53 PM   Specimen: Anterior Nasal Swab  Result Value Ref Range Status   SARS Coronavirus 2 by RT PCR NEGATIVE NEGATIVE Final    Comment: (NOTE) SARS-CoV-2 target nucleic acids are NOT DETECTED.  The SARS-CoV-2 RNA is generally detectable in upper respiratory specimens during the acute phase of infection. The lowest concentration of SARS-CoV-2 viral copies this assay can detect is 138 copies/mL. A negative result does not preclude SARS-Cov-2 infection and should not be used as the sole basis for treatment or other patient management decisions. A negative result may occur with  improper specimen collection/handling, submission of specimen other than nasopharyngeal swab, presence of viral mutation(s) within the areas targeted by this assay, and inadequate number of viral copies(<138 copies/mL). A negative result must be combined with clinical observations, patient history, and epidemiological information. The expected result is Negative.  Fact Sheet for Patients:  bloggercourse.com  Fact Sheet for Healthcare Providers:  seriousbroker.it  This test is no t yet approved or cleared by the [place]  FDA and  has been authorized for detection and/or diagnosis of SARS-CoV-2 by FDA under an Emergency Use Authorization (EUA). This EUA will remain  in effect (meaning this test can be used) for the duration of the COVID-19 declaration under Section 564(b)(1) of the Act, 21 U.S.C.section 360bbb-3(b)(1), unless the authorization is terminated  or revoked sooner.       Influenza A by PCR  NEGATIVE NEGATIVE Final   Influenza B by PCR NEGATIVE NEGATIVE Final    Comment: (NOTE) The Xpert Xpress SARS-CoV-2/FLU/RSV plus assay is intended as an aid in the diagnosis of influenza from Nasopharyngeal swab specimens and should not be used as a sole basis for treatment. Nasal washings and aspirates are unacceptable for Xpert Xpress SARS-CoV-2/FLU/RSV testing.  Fact Sheet for Patients: bloggercourse.com  Fact Sheet for Healthcare Providers: seriousbroker.it  This test is not yet approved or cleared by the [place]  FDA and has been authorized for detection and/or diagnosis of SARS-CoV-2 by FDA under an Emergency Use Authorization (EUA). This EUA will remain in effect (meaning this test can be used) for the duration of the COVID-19 declaration under Section 564(b)(1) of the Act, 21 U.S.C. section 360bbb-3(b)(1), unless the authorization is terminated or revoked.     Resp Syncytial Virus by PCR NEGATIVE NEGATIVE Final    Comment: (NOTE) Fact Sheet for Patients: bloggercourse.com  Fact Sheet for Healthcare Providers: seriousbroker.it  This test is not yet approved or cleared by the [place]  FDA and has been authorized for detection and/or diagnosis of SARS-CoV-2 by FDA under an Emergency Use Authorization (EUA). This EUA will remain in effect (meaning this test can be used) for the duration of the COVID-19 declaration under Section 564(b)(1) of the Act, 21 U.S.C. section 360bbb-3(b)(1), unless the authorization is terminated or revoked.  Performed at [facility], [address] [place], [place] [address]   Blood Culture (routine x 2)     Status: None (Preliminary result)   Collection Time: 01/10/24  5:53 PM   Specimen: BLOOD  Result Value Ref Range Status   Specimen Description   Final    BLOOD LEFT ANTECUBITAL Performed at [facility],  [address] [place], [place] [address]    Special Requests   Final    BOTTLES DRAWN AEROBIC AND ANAEROBIC Blood Culture adequate volume Performed at [facility], [address], [place], [place] [address]  Culture   Final    NO GROWTH 4 DAYS Performed at [facility], [address], [place], [place] [address]    Report Status PENDING  Incomplete  Blood Culture (routine x 2)     Status: None (Preliminary result)   Collection Time: 01/10/24  6:13 PM   Specimen: BLOOD  Result Value Ref Range Status   Specimen Description   Final    BLOOD RIGHT ANTECUBITAL Performed at [facility], [address] [place], [place] [address]    Special Requests   Final    BOTTLES DRAWN AEROBIC AND ANAEROBIC Blood Culture adequate volume Performed at [facility], [address] [place], [place] [address]    Culture   Final    NO GROWTH 4 DAYS Performed at [facility], [address] [place], [place] [address]    Report Status PENDING  Incomplete  MRSA Next Gen by PCR, Nasal     Status: None   Collection Time: 01/10/24 10:09 PM   Specimen: Nasal Mucosa; Nasal Swab  Result Value Ref Range Status   MRSA by PCR Next Gen NOT DETECTED NOT DETECTED Final    Comment: (NOTE) The GeneXpert MRSA Assay (FDA approved for NASAL specimens only), is one component of a comprehensive MRSA colonization surveillance program. It is not intended to diagnose MRSA infection nor to guide or monitor treatment for MRSA infections. Test performance is not FDA approved in patients less than 89 years old. Performed at [facility], [address], [place], [place] [address]   Urine Culture     Status: None   Collection Time: 01/10/24 11:58 PM   Specimen: Urine, Random  Result Value Ref Range Status   Specimen Description URINE, RANDOM  Final   Special Requests NONE Reflexed from [id number]  Final   Culture   Final    NO GROWTH Performed at [facility], [address] [place], [place] [address]    Report Status 01/12/2024 FINAL  Final  Culture, blood (Routine X 2) w Reflex to ID Panel     Status: None (Preliminary result)   Collection Time: 01/11/24  8:09 AM   Specimen: BLOOD LEFT HAND  Result Value Ref Range Status   Specimen Description BLOOD LEFT HAND  Final   Special Requests   Final    BOTTLES DRAWN AEROBIC AND ANAEROBIC Blood Culture adequate volume   Culture   Final    NO GROWTH 3 DAYS Performed at [facility], [address], [place], [place] [address]    Report Status PENDING  Incomplete  Culture, blood (Routine X 2) w Reflex to ID Panel     Status: None (Preliminary result)   Collection Time: 01/11/24  8:15 AM   Specimen: BLOOD RIGHT HAND  Result Value Ref Range Status   Specimen Description BLOOD RIGHT HAND  Final   Special Requests   Final    BOTTLES DRAWN AEROBIC AND ANAEROBIC Blood Culture adequate volume   Culture   Final    NO GROWTH 3 DAYS Performed at [facility], [address] [place], [place] [address]    Report Status PENDING  Incomplete         Radiology Studies: No results found.   Scheduled Meds:  Chlorhexidine  Gluconate Cloth  6 each Topical Daily   feeding supplement  237 mL Oral BID BM   gabapentin   100 mg Oral QID  heparin   5,000 Units Subcutaneous Q8H   heparin  sodium (porcine)  1,000 Units Intravenous Once   irbesartan   300 mg Oral Daily   linaclotide   290 mcg Oral QAC breakfast   multivitamin  1 tablet Oral QHS   mouth rinse  15 mL Mouth Rinse 4 times per day   polyethylene glycol  17 g Oral Daily   senna  1 tablet Oral BID   sodium chloride  flush  10-40 mL Intracatheter Q12H   tamsulosin   0.4 mg Oral Daily   thiamine   100 mg Oral Daily   Continuous Infusions:  cefTRIAXone  (ROCEPHIN )  IV 2 g (01/14/24 1415)   doxycycline  (VIBRAMYCIN ) IV 100 mg (01/14/24 2343)     LOS: 5 days   Time spent:  Peter JAYSON Montclair, DO [facility]  If 7PM-7AM,  please contact night-coverage [email or web address]  01/15/2024, 8:20 AM      "

## 2024-01-15 NOTE — Plan of Care (Signed)

## 2024-01-15 NOTE — Progress Notes (Signed)
 [facility] Progress Note   Assessment/ Plan:    AKI on CKD IV:             - baseline 3.11 as of 10/2023 - 2/2 hemodynamic insults in setting of septic shock - on CRRT - hyperkalemia and acidosis improving - d/c CRRT 01/12/24 - stable, doesn't need anymore dialysis- d/c nontunneled HD cath and the Foley - will need followup on d/c   2.  Septic shock:                      - broad spectrum abx and pressors via PCCM             - looks like possible pna LLL   3.  Severe hyperkalemia:             - received temporizing measures             - corrected via CRRT  - resolved   4.  DM II             - per primary   5.  Dispo: pending  Subjective:    Foley and nontunneled HD catheter removed.  Labs pending today   Objective:   BP (!) 104/41   Pulse 67   Temp 99.7 F (37.6 C)   Resp 18   Ht 5' 11 (1.803 m)   Wt 132 kg   SpO2 93%   BMI 40.59 kg/m   Intake/Output Summary (Last 24 hours) at 01/15/2024 1150 Last data filed at 01/15/2024 1100 Gross per 24 hour  Intake 360 ml  Output 1200 ml  Net -840 ml   Weight change:   Physical Exam: Physical Exam GEN appears better HEENT eomi perrl NECK + nontunneled HD cath PULM clear CV tachy ABD soft, mildly distended EXT 2+ LE edema NEURO  AAO x 3  Imaging: No results found.   Labs: BMET Recent Labs  Lab 01/10/24 1758 01/10/24 2314 01/11/24 0541 01/11/24 1107 01/11/24 1505 01/12/24 0324 01/13/24 1418 01/14/24 1030  NA 133* 134* 133*  134* 135 135 138 141 138  K 6.7* 6.2* 5.6*  5.7* 4.7 4.5 4.2 4.3 4.4  CL 105 108 106  107  --  104 103 104 103  CO2 11* 12* 15*  14*  --  21* 27 27 26   GLUCOSE 136* 142* 95  95  --  145* 78 153* 178*  BUN 109* 104* 90*  90*  --  69* 55* 55* 59*  CREATININE 6.97* 6.84* 5.82*  5.80*  --  4.43* 3.18* 3.05* 3.02*  CALCIUM  8.6* 8.4* 8.4*  8.2*  --  8.4* 8.7* 8.8* 8.6*  PHOS  --  6.5* 5.4*  5.3*  --  4.0 3.1 2.1* 2.2*   CBC Recent Labs  Lab 01/10/24 1758  01/10/24 2314 01/11/24 0541 01/11/24 1107 01/13/24 1418 01/14/24 1030  WBC 11.6* 10.6* 7.2  --  10.9* 11.7*  NEUTROABS 9.9* 8.6*  --   --   --   --   HGB 10.2* 10.2* 9.6* 8.5* 9.1* 8.4*  HCT 33.8* 33.0* 30.8* 25.0* 28.3* 26.8*  MCV 94.7 94.8 92.8  --  91.0 91.8  PLT 149* 142* 141*  --  124* 120*    Medications:     Chlorhexidine  Gluconate Cloth  6 each Topical Daily   feeding supplement  237 mL Oral BID BM   gabapentin   100 mg Oral QID   heparin   5,000 Units Subcutaneous  Q8H   heparin  sodium (porcine)  1,000 Units Intravenous Once   irbesartan   300 mg Oral Daily   linaclotide   290 mcg Oral QAC breakfast   multivitamin  1 tablet Oral QHS   mouth rinse  15 mL Mouth Rinse 4 times per day   polyethylene glycol  17 g Oral Daily   senna  1 tablet Oral BID   sodium chloride  flush  10-40 mL Intracatheter Q12H   tamsulosin   0.4 mg Oral Daily   thiamine   100 mg Oral Daily    Almarie Bonine MD 01/15/2024, 11:50 AM

## 2024-01-16 DIAGNOSIS — R579 Shock, unspecified: Secondary | ICD-10-CM | POA: Diagnosis not present

## 2024-01-16 LAB — CULTURE, BLOOD (ROUTINE X 2)
Culture: NO GROWTH
Culture: NO GROWTH
Special Requests: ADEQUATE
Special Requests: ADEQUATE

## 2024-01-16 MED ORDER — TORSEMIDE 20 MG PO TABS
20.0000 mg | ORAL_TABLET | Freq: Every day | ORAL | Status: DC
  Administered 2024-01-16 – 2024-01-23 (×8): 20 mg via ORAL
  Filled 2024-01-16 (×8): qty 1

## 2024-01-16 NOTE — Progress Notes (Signed)
 Pt family requesting compression socks and ace wrap for pt. States he wears them at home Family applied compression socks and ace wrap, will monitor

## 2024-01-16 NOTE — Progress Notes (Signed)
 Inpatient Rehab Admissions Coordinator:   Rescreened.  PNA improved, remains on room air.  Encephalopathy and AKI resolved. Based on current payor trends, I do not believe his insurance would approve CIR.  Recommend TOC pursue other rehab venues.   Reche Lowers, PT, DPT Admissions Coordinator [phone] 01/16/2024 3:50 PM

## 2024-01-16 NOTE — Plan of Care (Signed)

## 2024-01-16 NOTE — Progress Notes (Signed)
" °  Progress Note   Date: 01/16/2024  Patient Name: Peter Bryan        MRN#: [id number]  Clarification of the diagnosis of sepsis:   Sepsis is clinically supported - please clarify indicators and any associated organ dysfunction.      "

## 2024-01-16 NOTE — Progress Notes (Signed)
 Physical Therapy Treatment Patient Details Name: Peter Bryan MRN: [id number] DOB: 1960-07-12 Today's Date: 01/16/2024   History of Present Illness 64 y.o. male admitted 01/10/24 with worsening weakness, AMS, SOB. Workup for septic shock, severe hyperkalemia, AKI on CKD IV requiring CRRT 1/6-1/8. PMH includes HTN, DM2, CKD IV, multiple back sxs, chronic pain syndrome.    PT Comments  Pt admitted with above diagnosis. Pt continues to need mod assist of 2 to max assist of 2 to stand and take side steps. Pt with bil LE pain, right UE swelling and pain and poor endurance limiting him.  Pt very participatory and did his best.  Note that AIR states pts insurance likely wont cover pt therefore recommend post acute rehab < 3 hours day. Wife present and she states pt needs to be able to walk with Modif I with Rollator on d/c. Asked MD for SW consult.   Pt currently with functional limitations due to the deficits listed below (see PT Problem List). Pt will benefit from acute skilled PT to increase their independence and safety with mobility to allow discharge.       If plan is discharge home, recommend the following: A lot of help with walking and/or transfers;A lot of help with bathing/dressing/bathroom;Assistance with cooking/housework;Assist for transportation;Help with stairs or ramp for entrance   Can travel by private vehicle        Equipment Recommendations   (TBD next venue, though fairly well equipped, may need wheelchair)    Recommendations for Other Services       Precautions / Restrictions Precautions Precautions: Fall;Other (comment) Recall of Precautions/Restrictions: Impaired Precaution/Restrictions Comments: c/o R elbow pain and difficulty weight bearing (olecranon painful to palpation; seems like MD note attributing to gout flare up) Restrictions Weight Bearing Restrictions Per Provider Order: No     Mobility  Bed Mobility Overal bed mobility: Needs Assistance Bed Mobility:  Rolling, Sidelying to Sit Rolling: Min assist, +2 for safety/equipment, Used rails Sidelying to sit: Min assist, +2 for safety/equipment, Used rails       General bed mobility comments: significant increased time and effort, heavy use of bed rails    Transfers Overall transfer level: Needs assistance Equipment used: Rolling Bloodgood (2 wheels) Transfers: Sit to/from Stand, Bed to chair/wheelchair/BSC Sit to Stand: Max assist, +2 physical assistance, From elevated surface           General transfer comment: able to stand on second attempt from elevated bed height to RW with max assist for trunk elevation, heavy reliance on BUE support to push to stand, assist to bring RW closer once pt upright. Pt with heavy support on UEs and noted to have BM.  Cleaned pt and had pt side step to [facility] with pt taking increased time to do so due to bil LE pain.   multiple posterior LOB requiring intermittent modA to correct, difficulty clearing R foot    Ambulation/Gait Ambulation/Gait assistance: Mod assist, +2 safety/equipment   Assistive device: Rolling [place] (2 wheels)       Pre-gait activities: about 5 side steps to [facility] with assist to weight shift bilaterally with RW and mod assist for balance.     Stairs             Wheelchair Mobility     Tilt Bed    Modified Rankin (Stroke Patients Only)       Balance Overall balance assessment: Needs assistance Sitting-balance support: No upper extremity supported, Feet supported Sitting balance-Leahy Scale: Fair Sitting balance -  Comments: CGA EOB   Standing balance support: Bilateral upper extremity supported, Reliant on assistive device for balance, During functional activity Standing balance-Leahy Scale: Poor Standing balance comment: relies on RW and external support                            Communication Communication Communication: No apparent difficulties Factors Affecting Communication: Other (comment) (pt  requiring mildly increased time for processing and word finding)  Cognition Arousal: Alert Behavior During Therapy: Flat affect   PT - Cognitive impairments: Awareness, Attention, Initiation, Sequencing, Problem solving                       PT - Cognition Comments: A&Ox4, slowed processing and initiation at times, repetitive during session; family reports WFL baseline Following commands: Intact      Cueing Cueing Techniques: Verbal cues, Gestural cues  Exercises General Exercises - Lower Extremity Ankle Circles/Pumps: AROM, Both, 10 reps, Supine Long Arc Quad: AAROM, Both, 5 reps, Seated Heel Slides: AAROM, Both, 5 reps, Supine    General Comments General comments (skin integrity, edema, etc.): when laying pt back, noted left rotary nystagmus that cleared quickly. will assess further at next visit as pt was fatigued and PT assisting with getting him comfortable in chair position in bed.      Pertinent Vitals/Pain Pain Assessment Pain Assessment: Faces Faces Pain Scale: Hurts whole lot Pain Location: R elbow with AROM/weight bearing, bilateral feet (heaviness) Pain Descriptors / Indicators: Discomfort, Guarding Pain Intervention(s): Limited activity within patient's tolerance, Monitored during session, Repositioned, Patient requesting pain meds-RN notified    Home Living                          Prior Function            PT Goals (current goals can now be found in the care plan section) Progress towards PT goals: Progressing toward goals    Frequency    Min 3X/week      PT Plan      Co-evaluation              AM-PAC PT 6 Clicks Mobility   Outcome Measure  Help needed turning from your back to your side while in a flat bed without using bedrails?: A Little Help needed moving from lying on your back to sitting on the side of a flat bed without using bedrails?: A Little Help needed moving to and from a bed to a chair (including a  wheelchair)?: Total Help needed standing up from a chair using your arms (e.g., wheelchair or bedside chair)?: Total Help needed to walk in hospital room?: Total Help needed climbing 3-5 steps with a railing? : Total 6 Click Score: 10    End of Session Equipment Utilized During Treatment: Gait belt Activity Tolerance: Patient limited by fatigue;Patient limited by pain Patient left: with call bell/phone within reach;in bed;with bed alarm set;with family/visitor present Nurse Communication: Mobility status;Need for lift equipment Laurent) PT Visit Diagnosis: Unsteadiness on feet (R26.81);Other abnormalities of gait and mobility (R26.89);Muscle weakness (generalized) (M62.81);History of falling (Z91.81);Difficulty in walking, not elsewhere classified (R26.2);Other symptoms and signs involving the nervous system (R29.898);Pain     Time: 8461-8383 PT Time Calculation (min) (ACUTE ONLY): 38 min  Charges:    $Gait Training: 8-22 mins $Therapeutic Exercise: 8-22 mins $Therapeutic Activity: 8-22 mins PT General Charges $$ ACUTE PT VISIT: 1 Visit  [facility] M,PT Acute Rehab Services [phone]    Stephane JULIANNA Bevel 01/16/2024, 4:54 PM

## 2024-01-16 NOTE — Progress Notes (Signed)
 [facility] Progress Note   Assessment/ Plan:    AKI on CKD IV:             - baseline 3.11 as of 10/2023 has resolved to baseline - 2/2 hemodynamic insults in setting of septic shock - Received CRRT through 01/12/2024 -Temporary HD catheter removed - will need followup on d/c; follows at [facility] with [place].  He will need to call to schedule follow-up appointment   2.  Septic shock:Resolved back on blood pressure meds                3.  Severe hyperkalemia: Resolved   4.  DM II             - per primary   5.  Edema: Start torsemide  20 mg every morning.  Subjective:    Feels well no complaints other than some edema which is bothersome in his legs Creatinine stable at 3.0, K4.4 About 0.9 L of urine documented, may be more produced   Objective:   BP (!) 157/77 (BP Location: Left Wrist)   Pulse 65   Temp 99.2 F (37.3 C) (Oral)   Resp 18   Ht 5' 11 (1.803 m)   Wt 132 kg   SpO2 99%   BMI 40.59 kg/m   Intake/Output Summary (Last 24 hours) at 01/16/2024 1215 Last data filed at 01/16/2024 1030 Gross per 24 hour  Intake 238 ml  Output 1450 ml  Net -1212 ml   Weight change:   Physical Exam: Physical Exam GEN appears better HEENT eomi perrl NECK bandaged PULM clear CV tachy ABD soft, mildly distended EXT compression hose in place, 1+ edema   Imaging: No results found.   Labs: BMET Recent Labs  Lab 01/10/24 1758 01/10/24 2314 01/11/24 0541 01/11/24 1107 01/11/24 1505 01/12/24 0324 01/13/24 1418 01/14/24 1030  NA 133* 134* 133*  134* 135 135 138 141 138  K 6.7* 6.2* 5.6*  5.7* 4.7 4.5 4.2 4.3 4.4  CL 105 108 106  107  --  104 103 104 103  CO2 11* 12* 15*  14*  --  21* 27 27 26   GLUCOSE 136* 142* 95  95  --  145* 78 153* 178*  BUN 109* 104* 90*  90*  --  69* 55* 55* 59*  CREATININE 6.97* 6.84* 5.82*  5.80*  --  4.43* 3.18* 3.05* 3.02*  CALCIUM  8.6* 8.4* 8.4*  8.2*  --  8.4* 8.7* 8.8* 8.6*  PHOS  --  6.5* 5.4*  5.3*   --  4.0 3.1 2.1* 2.2*   CBC Recent Labs  Lab 01/10/24 1758 01/10/24 2314 01/11/24 0541 01/11/24 1107 01/13/24 1418 01/14/24 1030  WBC 11.6* 10.6* 7.2  --  10.9* 11.7*  NEUTROABS 9.9* 8.6*  --   --   --   --   HGB 10.2* 10.2* 9.6* 8.5* 9.1* 8.4*  HCT 33.8* 33.0* 30.8* 25.0* 28.3* 26.8*  MCV 94.7 94.8 92.8  --  91.0 91.8  PLT 149* 142* 141*  --  124* 120*    Medications:     Chlorhexidine  Gluconate Cloth  6 each Topical Daily   feeding supplement  237 mL Oral BID BM   gabapentin   100 mg Oral QID   heparin   5,000 Units Subcutaneous Q8H   heparin  sodium (porcine)  1,000 Units Intravenous Once   irbesartan   300 mg Oral Daily   linaclotide   290 mcg Oral QAC breakfast   multivitamin  1  tablet Oral QHS   mouth rinse  15 mL Mouth Rinse 4 times per day   polyethylene glycol  17 g Oral Daily   senna  1 tablet Oral BID   sodium chloride  flush  10-40 mL Intracatheter Q12H   tamsulosin   0.4 mg Oral Daily   torsemide   20 mg Oral Daily    Almarie Bonine MD 01/16/2024, 12:15 PM

## 2024-01-16 NOTE — Progress Notes (Signed)
 " PROGRESS NOTE    Peter Bryan  FMW:[id number] DOB: [birth year] DOA: 01/10/2024 PCP: Shlomo Silvano FERNS, NP   Brief Narrative:  64 year old male with history of HTN, stage IV CKD with recurrent hyperkalemia, DM2, OSA, morbid obesity, multiple back surgeries, chronic pain syndrome, presents to the med [facility] ED, brought by the son for altered mental status.  Patient ambulates with a Walthers/cane at baseline -confirmed at bedside 1/9.  At intake patient had notably profound hypotension, hyperkalemia and severe metabolic acidosis initially requiring pressors and CRRT. Given his imaging pneumonia was presumed to be primary etiology for his septic shock.  Now improving.  Off pressors as of 1/8 and transition to hospitalist team as of 1/9.  Assessment & Plan:   Principal Problem:   Shock (HCC) Active Problems:   AKI (acute kidney injury)   Septic shock (HCC)  Septic shock in the setting of left lower lobe pneumonia, improved - Off pressors as of 1/8; hypothermic with leukocytosis and source or LLL PNA noted - Continue broad-spectrum antibiotics with doxycycline , ceftriaxone  -plan for 5-day course - Continue to follow clinically although respiratory status appears to be resolving over the past 24 hours -patient was questionably transiently hypoxic at intake but weaned to room air within the first few hours.  Acute metabolic encephalopathy, resolved - Multifactorial in the setting of shock, infection, uremia - Rule out concurrent secondary etiology including medications  AKI on CKD4, resolved Severe metabolic acidosis High anion gap metabolic acidosis - Presumably secondary to shock as above - Improving, CRRT discontinued 1/8, appreciate nephrology insight and recommendations - Creatinine appears to be back to baseline  Ambulatory dysfunction, acute  - PT OT following, likely will need placement given ongoing weakness  HTN - Improving with resumption of home medication   Gout  exacerbation ruled out - Uric acid levels within normal limits, no pointed tenderness on exam  DM 2, non-insulin -dependent, well controlled - A1c well-controlled at 5.4 resume home medications at discharge  BPH - No acute issues, resume home medications once blood pressure normalizes  DVT prophylaxis: heparin  injection 5,000 Units Start: 01/11/24 0600 SCDs Start: 01/10/24 2209 Code Status:   Code Status: Full Code Family Communication: None present  Status is: Inpatient  Dispo: The patient is from: Home              Anticipated d/c is to: To be determined pending PT evaluation              Anticipated d/c date is: To be determined              Patient currently not medically stable for discharge  Consultants:  Nephrology, PCCM  Procedures:  CRRT, off since 1/8  Antimicrobials:  Ceftriaxone , doxycycline  -plan for 5-day course  Subjective: No acute issues or events overnight -reports poor sleep over the past few days but otherwise denies nausea vomiting diarrhea constipation headache fevers chills or chest pain.  Appetite appears to be improving over the past few days as well.  Objective: Vitals:   01/15/24 1640 01/15/24 2050 01/15/24 2114 01/16/24 0454  BP: 135/66  (!) 149/66 (!) 144/66  Pulse: 70 62 64 65  Resp: 18 18 18 18   Temp: 98.2 F (36.8 C)  98.3 F (36.8 C) 98.4 F (36.9 C)  TempSrc: Oral     SpO2: 96% 100% 100% 97%  Weight:      Height:        Intake/Output Summary (Last 24 hours) at 01/16/2024 0804 Last  data filed at 01/16/2024 0513 Gross per 24 hour  Intake 480 ml  Output 850 ml  Net -370 ml   Filed Weights   01/10/24 2214 01/11/24 0155 01/12/24 0500  Weight: (!) 137 kg (!) 137 kg 132 kg    Examination:  General: Resting comfortably in bed no acute distress HEENT:  Normocephalic atraumatic.  Sclerae nonicteric, noninjected.  Extraocular movements intact bilaterally. Neck: Short, unable to distinguish landmarks Lungs: Diminished without overt  wheeze or rales. Heart:  Regular rate and rhythm.  Without murmurs, rubs, or gallops. Abdomen: Obese, nontender, nontympanic Extremities: Without cyanosis, clubbing, edema, or obvious deformity.  Data Reviewed: I have personally reviewed following labs and imaging studies  CBC: Recent Labs  Lab 01/10/24 1758 01/10/24 2314 01/11/24 0541 01/11/24 1107 01/13/24 1418 01/14/24 1030  WBC 11.6* 10.6* 7.2  --  10.9* 11.7*  NEUTROABS 9.9* 8.6*  --   --   --   --   HGB 10.2* 10.2* 9.6* 8.5* 9.1* 8.4*  HCT 33.8* 33.0* 30.8* 25.0* 28.3* 26.8*  MCV 94.7 94.8 92.8  --  91.0 91.8  PLT 149* 142* 141*  --  124* 120*   Basic Metabolic Panel: Recent Labs  Lab 01/11/24 0541 01/11/24 1107 01/11/24 1505 01/12/24 0324 01/13/24 1418 01/14/24 1030  NA 133*  134* 135 135 138 141 138  K 5.6*  5.7* 4.7 4.5 4.2 4.3 4.4  CL 106  107  --  104 103 104 103  CO2 15*  14*  --  21* 27 27 26   GLUCOSE 95  95  --  145* 78 153* 178*  BUN 90*  90*  --  69* 55* 55* 59*  CREATININE 5.82*  5.80*  --  4.43* 3.18* 3.05* 3.02*  CALCIUM  8.4*  8.2*  --  8.4* 8.7* 8.8* 8.6*  MG 2.1  --   --  1.8  --   --   PHOS 5.4*  5.3*  --  4.0 3.1 2.1* 2.2*   GFR: Estimated Creatinine Clearance: 33.8 mL/min (A) (by C-G formula based on SCr of 3.02 mg/dL (H)). Liver Function Tests: Recent Labs  Lab 01/10/24 1758 01/10/24 2314 01/11/24 0541 01/11/24 1505 01/12/24 0324 01/13/24 1418 01/14/24 1030  AST 11* 11*  --   --   --   --   --   ALT 21 18  --   --   --   --   --   ALKPHOS 79 72  --   --   --   --   --   BILITOT 0.3 0.3  --   --   --   --   --   PROT 7.0 6.2*  --   --   --   --   --   ALBUMIN 4.4 3.9 3.4* 3.4* 3.4* 3.6 3.5   Recent Labs  Lab 01/10/24 2314  LIPASE 98*  AMYLASE 91   Coagulation Profile: Recent Labs  Lab 01/10/24 1758 01/10/24 2314  INR 1.1 1.2   BNP (last 3 results) Recent Labs    01/10/24 1755 01/10/24 2314  PROBNP 255.0 272.0   HbA1C: No results for input(s): HGBA1C  in the last 72 hours.  CBG: Recent Labs  Lab 01/11/24 1554 01/11/24 2001 01/11/24 2343 01/12/24 0801 01/12/24 1146  GLUCAP 126* 105* 159* 91 124*   Sepsis Labs: Recent Labs  Lab 01/10/24 1753 01/10/24 2024 01/11/24 0514  LATICACIDVEN 0.5 0.8 0.3*    Recent Results (from the past 240 hours)  Resp panel by RT-PCR (RSV, Flu A&B, Covid) Anterior Nasal Swab     Status: None   Collection Time: 01/10/24  5:53 PM   Specimen: Anterior Nasal Swab  Result Value Ref Range Status   SARS Coronavirus 2 by RT PCR NEGATIVE NEGATIVE Final    Comment: (NOTE) SARS-CoV-2 target nucleic acids are NOT DETECTED.  The SARS-CoV-2 RNA is generally detectable in upper respiratory specimens during the acute phase of infection. The lowest concentration of SARS-CoV-2 viral copies this assay can detect is 138 copies/mL. A negative result does not preclude SARS-Cov-2 infection and should not be used as the sole basis for treatment or other patient management decisions. A negative result may occur with  improper specimen collection/handling, submission of specimen other than nasopharyngeal swab, presence of viral mutation(s) within the areas targeted by this assay, and inadequate number of viral copies(<138 copies/mL). A negative result must be combined with clinical observations, patient history, and epidemiological information. The expected result is Negative.  Fact Sheet for Patients:  bloggercourse.com  Fact Sheet for Healthcare Providers:  seriousbroker.it  This test is no t yet approved or cleared by the [place]  FDA and  has been authorized for detection and/or diagnosis of SARS-CoV-2 by FDA under an Emergency Use Authorization (EUA). This EUA will remain  in effect (meaning this test can be used) for the duration of the COVID-19 declaration under Section 564(b)(1) of the Act, 21 U.S.C.section 360bbb-3(b)(1), unless the authorization is  terminated  or revoked sooner.       Influenza A by PCR NEGATIVE NEGATIVE Final   Influenza B by PCR NEGATIVE NEGATIVE Final    Comment: (NOTE) The Xpert Xpress SARS-CoV-2/FLU/RSV plus assay is intended as an aid in the diagnosis of influenza from Nasopharyngeal swab specimens and should not be used as a sole basis for treatment. Nasal washings and aspirates are unacceptable for Xpert Xpress SARS-CoV-2/FLU/RSV testing.  Fact Sheet for Patients: bloggercourse.com  Fact Sheet for Healthcare Providers: seriousbroker.it  This test is not yet approved or cleared by the [place]  FDA and has been authorized for detection and/or diagnosis of SARS-CoV-2 by FDA under an Emergency Use Authorization (EUA). This EUA will remain in effect (meaning this test can be used) for the duration of the COVID-19 declaration under Section 564(b)(1) of the Act, 21 U.S.C. section 360bbb-3(b)(1), unless the authorization is terminated or revoked.     Resp Syncytial Virus by PCR NEGATIVE NEGATIVE Final    Comment: (NOTE) Fact Sheet for Patients: bloggercourse.com  Fact Sheet for Healthcare Providers: seriousbroker.it  This test is not yet approved or cleared by the [place]  FDA and has been authorized for detection and/or diagnosis of SARS-CoV-2 by FDA under an Emergency Use Authorization (EUA). This EUA will remain in effect (meaning this test can be used) for the duration of the COVID-19 declaration under Section 564(b)(1) of the Act, 21 U.S.C. section 360bbb-3(b)(1), unless the authorization is terminated or revoked.  Performed at [facility], [address] [place], [place] [address]   Blood Culture (routine x 2)     Status: None   Collection Time: 01/10/24  5:53 PM   Specimen: BLOOD  Result Value Ref Range Status   Specimen Description   Final    BLOOD LEFT  ANTECUBITAL Performed at [facility], [address] [place], [place] [address]    Special Requests   Final    BOTTLES DRAWN AEROBIC AND ANAEROBIC Blood Culture adequate volume Performed at  [facility], [address] [place], [place] [address]    Culture   Final    NO GROWTH 5 DAYS Performed at [facility], [address] [place], [place] [address]    Report Status 01/15/2024 FINAL  Final  Blood Culture (routine x 2)     Status: None   Collection Time: 01/10/24  6:13 PM   Specimen: BLOOD  Result Value Ref Range Status   Specimen Description   Final    BLOOD RIGHT ANTECUBITAL Performed at [facility], [address] [place], [place] [address]    Special Requests   Final    BOTTLES DRAWN AEROBIC AND ANAEROBIC Blood Culture adequate volume Performed at [facility], [address] [place], [place] [address]    Culture   Final    NO GROWTH 5 DAYS Performed at [facility], [address], [place], [place] [address]    Report Status 01/15/2024 FINAL  Final  MRSA Next Gen by PCR, Nasal     Status: None   Collection Time: 01/10/24 10:09 PM   Specimen: Nasal Mucosa; Nasal Swab  Result Value Ref Range Status   MRSA by PCR Next Gen NOT DETECTED NOT DETECTED Final    Comment: (NOTE) The GeneXpert MRSA Assay (FDA approved for NASAL specimens only), is one component of a comprehensive MRSA colonization surveillance program. It is not intended to diagnose MRSA infection nor to guide or monitor treatment for MRSA infections. Test performance is not FDA approved in patients less than 66 years old. Performed at [facility], [address] [place], [place] [address]   Urine Culture     Status: None   Collection Time: 01/10/24 11:58 PM   Specimen: Urine, Random  Result Value Ref Range Status   Specimen Description URINE, RANDOM  Final   Special Requests NONE Reflexed from [phone]  Final   Culture   Final    NO  GROWTH Performed at [facility], [address], [place], [place] [address]    Report Status 01/12/2024 FINAL  Final  Culture, blood (Routine X 2) w Reflex to ID Panel     Status: None (Preliminary result)   Collection Time: 01/11/24  8:09 AM   Specimen: BLOOD LEFT HAND  Result Value Ref Range Status   Specimen Description BLOOD LEFT HAND  Final   Special Requests   Final    BOTTLES DRAWN AEROBIC AND ANAEROBIC Blood Culture adequate volume   Culture   Final    NO GROWTH 4 DAYS Performed at [facility], [address], [place], [place] [address]    Report Status PENDING  Incomplete  Culture, blood (Routine X 2) w Reflex to ID Panel     Status: None (Preliminary result)   Collection Time: 01/11/24  8:15 AM   Specimen: BLOOD RIGHT HAND  Result Value Ref Range Status   Specimen Description BLOOD RIGHT HAND  Final   Special Requests   Final    BOTTLES DRAWN AEROBIC AND ANAEROBIC Blood Culture adequate volume   Culture   Final    NO GROWTH 4 DAYS Performed at [facility], [address], [place], [place] [address]    Report Status PENDING  Incomplete         Radiology Studies: No results found.   Scheduled Meds:  Chlorhexidine  Gluconate Cloth  6 each Topical Daily   feeding supplement  237 mL Oral BID BM   gabapentin   100 mg Oral QID   heparin   5,000 Units Subcutaneous Q8H   heparin  sodium (porcine)  1,000 Units Intravenous Once   irbesartan   300 mg Oral Daily   linaclotide   290 mcg Oral QAC breakfast   multivitamin  1 tablet Oral QHS   mouth rinse  15 mL Mouth Rinse 4 times per day   polyethylene glycol  17 g Oral Daily   senna  1 tablet Oral BID   sodium chloride  flush  10-40 mL Intracatheter Q12H   tamsulosin   0.4 mg Oral Daily   thiamine   100 mg Oral Daily   Continuous Infusions:     LOS: 6 days   Time spent:  Elsie JAYSON Montclair, DO [facility]  If 7PM-7AM, please contact night-coverage [email or web address]  01/16/2024, 8:04  AM      "

## 2024-01-17 LAB — BASIC METABOLIC PANEL WITH GFR
Anion gap: 11 (ref 5–15)
BUN: 78 mg/dL — ABNORMAL HIGH (ref 8–23)
CO2: 24 mmol/L (ref 22–32)
Calcium: 8.9 mg/dL (ref 8.9–10.3)
Chloride: 101 mmol/L (ref 98–111)
Creatinine, Ser: 3.09 mg/dL — ABNORMAL HIGH (ref 0.61–1.24)
GFR, Estimated: 22 mL/min — ABNORMAL LOW
Glucose, Bld: 111 mg/dL — ABNORMAL HIGH (ref 70–99)
Potassium: 5 mmol/L (ref 3.5–5.1)
Sodium: 136 mmol/L (ref 135–145)

## 2024-01-17 NOTE — Progress Notes (Signed)
 Physical Therapy Treatment Patient Details Name: Peter Bryan MRN: [id number] DOB: 1960-07-25 Today's Date: 01/17/2024   History of Present Illness 64 y.o. male admitted 01/10/24 with worsening weakness, AMS, SOB. Workup for septic shock, severe hyperkalemia, AKI on CKD IV requiring CRRT 1/6-1/8. PMH includes HTN, DM2, CKD IV, multiple back sxs, chronic pain syndrome.    PT Comments  Pt admitted with above diagnosis. Pt was able to progress ambulation a short distance today with +2 mod assist with RW. Needs cues and assist for all mobility.  Pt continues to have decr endurance and incr pain with movement. Did treat for left posterior canal BPPV via Epley with pt reporting decr dizziness after. Continue to progress pt and still need post acute rehab < 3 hours day.  Pt currently with functional limitations due to the deficits listed below (see PT Problem List). Pt will benefit from acute skilled PT to increase their independence and safety with mobility to allow discharge.       If plan is discharge home, recommend the following: A lot of help with walking and/or transfers;A lot of help with bathing/dressing/bathroom;Assistance with cooking/housework;Assist for transportation;Help with stairs or ramp for entrance   Can travel by private vehicle        Equipment Recommendations   (TBD next venue, though fairly well equipped, may need wheelchair)    Recommendations for Other Services       Precautions / Restrictions Precautions Precautions: Fall;Other (comment) Recall of Precautions/Restrictions: Impaired Precaution/Restrictions Comments: c/o R elbow pain and hand swollen and difficulty weight bearing (olecranon painful to palpation; seems like MD note attributing to gout flare up) Restrictions Weight Bearing Restrictions Per Provider Order: No     Mobility  Bed Mobility Overal bed mobility: Needs Assistance Bed Mobility: Rolling, Sidelying to Sit Rolling: Min assist, Used  rails Sidelying to sit: Min assist, Used rails, HOB elevated       General bed mobility comments: Before getting pt up, assessed for left BPPV and suspicious for left posterior canal BPPV therefore performed Epley manuever.  Difficult to perform due to pts neck limitations from previous surgeries but used bed to get positioning. Pt reports dizziness better after the Epley.  Pt with significant increased time and effort, heavy use of bed rails to come to EOB.    Transfers Overall transfer level: Needs assistance Equipment used: Rolling Hakes (2 wheels) Transfers: Sit to/from Stand, Bed to chair/wheelchair/BSC Sit to Stand: Max assist           General transfer comment: able to stand on second attempt from elevated bed height to RW with mod  assist +2  for trunk elevation, heavy reliance on BUE support to push to stand, assist to bring RW closer once pt upright. Pt with heavy support on UEs. Was going to walk into bathroom but only took a few steps to 3N1 as he fatigues quickly and is in pain. Stood from the taller 3N1 with min assist of 2.  Cleaned pt and he walked back to the chair.    Ambulation/Gait Ambulation/Gait assistance: Mod assist, +2 safety/equipment Gait Distance (Feet): 5 Feet (5 feet x 2) Assistive device: Rolling Slager (2 wheels) Gait Pattern/deviations: Step-through pattern, Decreased stride length, Decreased step length - right, Decreased step length - left, Decreased dorsiflexion - left, Antalgic, Drifts right/left, Trunk flexed, Wide base of support   Gait velocity interpretation: <1.31 ft/sec, indicative of household ambulator   General Gait Details: OT arrived for standing pt for +2 assist as  well as to assess toileting. Pt ambulated with RW with varying assist at times needing min and up to mod assist at times for safety as pt fatigues and has pain in bil LEs as well.  Cues for sequencing steps and RW as well.  Needed assist to steer RW as well.   Stairs              Wheelchair Mobility     Tilt Bed    Modified Rankin (Stroke Patients Only)       Balance Overall balance assessment: Needs assistance Sitting-balance support: No upper extremity supported, Feet supported Sitting balance-Leahy Scale: Fair Sitting balance - Comments: CGA EOB   Standing balance support: Bilateral upper extremity supported, Reliant on assistive device for balance, During functional activity Standing balance-Leahy Scale: Poor Standing balance comment: relies on RW and external support                            Communication Communication Communication: No apparent difficulties Factors Affecting Communication: Other (comment) (pt requiring mildly increased time for processing and word finding)  Cognition Arousal: Alert Behavior During Therapy: Flat affect   PT - Cognitive impairments: Awareness, Attention, Initiation, Sequencing, Problem solving                       PT - Cognition Comments: A&Ox4, slowed processing and initiation at times, repetitive during session; family reports WFL baseline Following commands: Intact      Cueing Cueing Techniques: Verbal cues, Gestural cues  Exercises General Exercises - Lower Extremity Ankle Circles/Pumps: AROM, Both, 10 reps, Supine Long Arc Quad: AAROM, Both, 5 reps, Seated Heel Slides: AAROM, Both, 5 reps, Supine    General Comments        Pertinent Vitals/Pain Pain Assessment Pain Assessment: Faces Faces Pain Scale: Hurts whole lot Pain Location: R elbow and hand with AROM/weight bearing, bilateral feet (heaviness) Pain Descriptors / Indicators: Discomfort, Guarding Pain Intervention(s): Limited activity within patient's tolerance, Monitored during session, Premedicated before session, Repositioned    Home Living                          Prior Function            PT Goals (current goals can now be found in the care plan section) Acute Rehab PT Goals Patient  Stated Goal: get well, return home Progress towards PT goals: Progressing toward goals    Frequency    Min 3X/week      PT Plan      Co-evaluation PT/OT/SLP Co-Evaluation/Treatment: Yes Reason for Co-Treatment: Complexity of the patient's impairments (multi-system involvement);For patient/therapist safety PT goals addressed during session: Mobility/safety with mobility;Balance;Proper use of DME        AM-PAC PT 6 Clicks Mobility   Outcome Measure  Help needed turning from your back to your side while in a flat bed without using bedrails?: A Little Help needed moving from lying on your back to sitting on the side of a flat bed without using bedrails?: A Little Help needed moving to and from a bed to a chair (including a wheelchair)?: Total Help needed standing up from a chair using your arms (e.g., wheelchair or bedside chair)?: Total Help needed to walk in hospital room?: Total Help needed climbing 3-5 steps with a railing? : Total 6 Click Score: 10    End of Session Equipment Utilized During Treatment:  Gait belt Activity Tolerance: Patient limited by fatigue;Patient limited by pain Patient left: with call bell/phone within reach;in chair Nurse Communication: Mobility status;Need for lift equipment Peter Bryan) PT Visit Diagnosis: Unsteadiness on feet (R26.81);Other abnormalities of gait and mobility (R26.89);Muscle weakness (generalized) (M62.81);History of falling (Z91.81);Difficulty in walking, not elsewhere classified (R26.2);Other symptoms and signs involving the nervous system (R29.898);Pain Pain - part of body:  (feet)     Time: 8990-8958 PT Time Calculation (min) (ACUTE ONLY): 32 min  Charges:    $Gait Training: 23-37 mins PT General Charges $$ ACUTE PT VISIT: 1 Visit                     Hasini Peachey M,PT Acute Rehab Services [phone]    Peter Bryan 01/17/2024, 12:10 PM

## 2024-01-17 NOTE — TOC Progression Note (Signed)
 Transition of Care [facility]) - Progression Note    Patient Details  Name: Peter Bryan MRN: [id number] Date of Birth: [birth year]  Transition of Care [facility]) CM/SW Contact  Tom-Johnson, Anamari Galeas Daphne, RN Phone Number: 01/17/2024, 9:04 AM  Clinical Narrative:     Patient re-screened by CIR, recommending to pursue other rehab venues as patient's Encephalopathy and AKI is resolved. PT updated recommendation to SNF at this time.   CM will continue to follow for home needs.                     Expected Discharge Plan and Services                                               Social Drivers of Health (SDOH) Interventions SDOH Screenings   Food Insecurity: No Food Insecurity (01/13/2024)  Housing: Low Risk (01/13/2024)  Transportation Needs: No Transportation Needs (01/13/2024)  Utilities: Not At Risk (01/13/2024)  Financial Resource Strain: Patient Declined (08/13/2023)   [facility] from [facility]  Physical Activity: Inactive (08/13/2023)   Received from [facility]  Social Connections: Socially Integrated (01/13/2024)  Stress: No Stress Concern Present (08/13/2023)   Received from [facility]  Tobacco Use: Low Risk (01/10/2024)    Readmission Risk Interventions    01/13/2024    1:16 PM  Readmission Risk Prevention Plan  Transportation Screening Complete  PCP or Specialist Appt within 5-7 Days Complete  Home Care Screening Complete  Medication Review (RN CM) Referral to Pharmacy

## 2024-01-17 NOTE — Progress Notes (Signed)
 " PROGRESS NOTE    Peter Bryan  FMW:[id number] DOB: [birth year] DOA: 01/10/2024 PCP: Shlomo Silvano FERNS, NP   Brief Narrative:  64 year old male with history of HTN, stage IV CKD with recurrent hyperkalemia, DM2, OSA, morbid obesity, multiple back surgeries, chronic pain syndrome, presents to the med [facility] ED, brought by the son for altered mental status.  Patient ambulates with a Gangemi/cane at baseline -confirmed at bedside 1/9.  At intake patient had notably profound hypotension, hyperkalemia and severe metabolic acidosis initially requiring pressors and CRRT. Given his imaging pneumonia was presumed to be primary etiology for his septic shock.  Now improving.  Off pressors as of 1/8 and transition to hospitalist team as of 1/9.   Patient currently medically stable for discharge, awaiting safe disposition per ongoing evaluation with PT and OT -patient not a candidate for inpatient rehab, currently awaiting for possible SNF placement  Assessment & Plan:   Principal Problem:   Shock [facility]) Active Problems:   AKI (acute kidney injury)   Septic shock (HCC)  Septic shock in the setting of left lower lobe pneumonia, resolved - Off pressors as of 1/8; hypothermic with leukocytosis and source or LLL PNA noted - Completed doxycycline , ceftriaxone  on 1/11 - Continue to follow clinically although respiratory status appears to be resolving over the past 24 hours -patient was questionably transiently hypoxic at intake but weaned to room air within the first few hours.  Acute metabolic encephalopathy, resolved - Multifactorial in the setting of shock, infection, uremia - Rule out concurrent secondary etiology including medications  AKI on CKD4, resolved Severe metabolic acidosis High anion gap metabolic acidosis, resolved - Presumably secondary to shock as above - Improving, CRRT discontinued 1/8, appreciate nephrology insight and recommendations - Creatinine/GFR appears to be back to  baseline  Ambulatory dysfunction, acute  - PT OT following, likely will need placement given ongoing weakness  HTN - Improving with resumption of home medication  Gout exacerbation ruled out - Uric acid levels within normal limits, no pointed tenderness on exam DM 2, non-insulin -dependent, well controlled - A1c well-controlled at 5.4 resume home medications at discharge BPH - No acute issues, resume home medications once blood pressure normalizes  DVT prophylaxis: heparin  injection 5,000 Units Start: 01/11/24 0600 SCDs Start: 01/10/24 2209 Code Status:   Code Status: Full Code Family Communication: None present  Status is: Inpatient  Dispo: The patient is from: Home              Anticipated d/c is to: To be determined pending PT evaluation              Anticipated d/c date is: To be determined              Patient currently not medically stable for discharge  Consultants:  Nephrology, PCCM  Procedures:  CRRT, off since 1/8  Antimicrobials:  Ceftriaxone , doxycycline  -plan for 5-day course  Subjective: No acute issues or events overnight -reports poor sleep over the past few days but otherwise denies nausea vomiting diarrhea constipation headache fevers chills or chest pain.  Appetite appears to be improving over the past few days as well.  Objective: Vitals:   01/16/24 0808 01/16/24 1712 01/16/24 2004 01/17/24 0503  BP: (!) 157/77 (!) 143/58 (!) 151/72 (!) 133/56  Pulse: 65 64 64 62  Resp:  18 16 18   Temp: 99.2 F (37.3 C) 98.6 F (37 C) 99.2 F (37.3 C) 99.4 F (37.4 C)  TempSrc: Oral   Oral  SpO2: 99% 94%  94%  Weight:      Height:        Intake/Output Summary (Last 24 hours) at 01/17/2024 0721 Last data filed at 01/17/2024 0600 Gross per 24 hour  Intake 1078 ml  Output 2700 ml  Net -1622 ml   Filed Weights   01/10/24 2214 01/11/24 0155 01/12/24 0500  Weight: (!) 137 kg (!) 137 kg 132 kg    Examination:  General: Resting comfortably in bed no acute  distress HEENT:  Normocephalic atraumatic.  Sclerae nonicteric, noninjected.  Extraocular movements intact bilaterally. Neck: Short, unable to distinguish landmarks Lungs: Diminished without overt wheeze or rales. Heart:  Regular rate and rhythm.  Without murmurs, rubs, or gallops. Abdomen: Obese, nontender, nontympanic Extremities: Without cyanosis, clubbing, edema, or obvious deformity.  Data Reviewed: I have personally reviewed following labs and imaging studies  CBC: Recent Labs  Lab 01/10/24 1758 01/10/24 2314 01/11/24 0541 01/11/24 1107 01/13/24 1418 01/14/24 1030  WBC 11.6* 10.6* 7.2  --  10.9* 11.7*  NEUTROABS 9.9* 8.6*  --   --   --   --   HGB 10.2* 10.2* 9.6* 8.5* 9.1* 8.4*  HCT 33.8* 33.0* 30.8* 25.0* 28.3* 26.8*  MCV 94.7 94.8 92.8  --  91.0 91.8  PLT 149* 142* 141*  --  124* 120*   Basic Metabolic Panel: Recent Labs  Lab 01/11/24 0541 01/11/24 1107 01/11/24 1505 01/12/24 0324 01/13/24 1418 01/14/24 1030 01/17/24 0017  NA 133*  134*   < > 135 138 141 138 136  K 5.6*  5.7*   < > 4.5 4.2 4.3 4.4 5.0  CL 106  107  --  104 103 104 103 101  CO2 15*  14*  --  21* 27 27 26 24   GLUCOSE 95  95  --  145* 78 153* 178* 111*  BUN 90*  90*  --  69* 55* 55* 59* 78*  CREATININE 5.82*  5.80*  --  4.43* 3.18* 3.05* 3.02* 3.09*  CALCIUM  8.4*  8.2*  --  8.4* 8.7* 8.8* 8.6* 8.9  MG 2.1  --   --  1.8  --   --   --   PHOS 5.4*  5.3*  --  4.0 3.1 2.1* 2.2*  --    < > = values in this interval not displayed.   GFR: Estimated Creatinine Clearance: 33 mL/min (A) (by C-G formula based on SCr of 3.09 mg/dL (H)). Liver Function Tests: Recent Labs  Lab 01/10/24 1758 01/10/24 2314 01/11/24 0541 01/11/24 1505 01/12/24 0324 01/13/24 1418 01/14/24 1030  AST 11* 11*  --   --   --   --   --   ALT 21 18  --   --   --   --   --   ALKPHOS 79 72  --   --   --   --   --   BILITOT 0.3 0.3  --   --   --   --   --   PROT 7.0 6.2*  --   --   --   --   --   ALBUMIN 4.4 3.9 3.4*  3.4* 3.4* 3.6 3.5   Recent Labs  Lab 01/10/24 2314  LIPASE 98*  AMYLASE 91   Coagulation Profile: Recent Labs  Lab 01/10/24 1758 01/10/24 2314  INR 1.1 1.2   BNP (last 3 results) Recent Labs    01/10/24 1755 01/10/24 2314  PROBNP 255.0 272.0   HbA1C: No  results for input(s): HGBA1C in the last 72 hours.  CBG: Recent Labs  Lab 01/11/24 1554 01/11/24 2001 01/11/24 2343 01/12/24 0801 01/12/24 1146  GLUCAP 126* 105* 159* 91 124*   Sepsis Labs: Recent Labs  Lab 01/10/24 1753 01/10/24 2024 01/11/24 0514  LATICACIDVEN 0.5 0.8 0.3*    Recent Results (from the past 240 hours)  Resp panel by RT-PCR (RSV, Flu A&B, Covid) Anterior Nasal Swab     Status: None   Collection Time: 01/10/24  5:53 PM   Specimen: Anterior Nasal Swab  Result Value Ref Range Status   SARS Coronavirus 2 by RT PCR NEGATIVE NEGATIVE Final    Comment: (NOTE) SARS-CoV-2 target nucleic acids are NOT DETECTED.  The SARS-CoV-2 RNA is generally detectable in upper respiratory specimens during the acute phase of infection. The lowest concentration of SARS-CoV-2 viral copies this assay can detect is 138 copies/mL. A negative result does not preclude SARS-Cov-2 infection and should not be used as the sole basis for treatment or other patient management decisions. A negative result may occur with  improper specimen collection/handling, submission of specimen other than nasopharyngeal swab, presence of viral mutation(s) within the areas targeted by this assay, and inadequate number of viral copies(<138 copies/mL). A negative result must be combined with clinical observations, patient history, and epidemiological information. The expected result is Negative.  Fact Sheet for Patients:  bloggercourse.com  Fact Sheet for Healthcare Providers:  seriousbroker.it  This test is no t yet approved or cleared by the [place]  FDA and  has been  authorized for detection and/or diagnosis of SARS-CoV-2 by FDA under an Emergency Use Authorization (EUA). This EUA will remain  in effect (meaning this test can be used) for the duration of the COVID-19 declaration under Section 564(b)(1) of the Act, 21 U.S.C.section 360bbb-3(b)(1), unless the authorization is terminated  or revoked sooner.       Influenza A by PCR NEGATIVE NEGATIVE Final   Influenza B by PCR NEGATIVE NEGATIVE Final    Comment: (NOTE) The Xpert Xpress SARS-CoV-2/FLU/RSV plus assay is intended as an aid in the diagnosis of influenza from Nasopharyngeal swab specimens and should not be used as a sole basis for treatment. Nasal washings and aspirates are unacceptable for Xpert Xpress SARS-CoV-2/FLU/RSV testing.  Fact Sheet for Patients: bloggercourse.com  Fact Sheet for Healthcare Providers: seriousbroker.it  This test is not yet approved or cleared by the [place]  FDA and has been authorized for detection and/or diagnosis of SARS-CoV-2 by FDA under an Emergency Use Authorization (EUA). This EUA will remain in effect (meaning this test can be used) for the duration of the COVID-19 declaration under Section 564(b)(1) of the Act, 21 U.S.C. section 360bbb-3(b)(1), unless the authorization is terminated or revoked.     Resp Syncytial Virus by PCR NEGATIVE NEGATIVE Final    Comment: (NOTE) Fact Sheet for Patients: bloggercourse.com  Fact Sheet for Healthcare Providers: seriousbroker.it  This test is not yet approved or cleared by the [place]  FDA and has been authorized for detection and/or diagnosis of SARS-CoV-2 by FDA under an Emergency Use Authorization (EUA). This EUA will remain in effect (meaning this test can be used) for the duration of the COVID-19 declaration under Section 564(b)(1) of the Act, 21 U.S.C. section 360bbb-3(b)(1), unless the  authorization is terminated or revoked.  Performed at [facility], [address] [place], [place] [address]   Blood Culture (routine x 2)     Status: None   Collection Time: 01/10/24  5:53 PM   Specimen: BLOOD  Result Value Ref Range Status   Specimen Description   Final    BLOOD LEFT ANTECUBITAL Performed at [facility], [address] [place], [place] [address]    Special Requests   Final    BOTTLES DRAWN AEROBIC AND ANAEROBIC Blood Culture adequate volume Performed at [facility], [address] [place], [place] [address]    Culture   Final    NO GROWTH 5 DAYS Performed at [facility], [address], [place], [place] [address]    Report Status 01/15/2024 FINAL  Final  Blood Culture (routine x 2)     Status: None   Collection Time: 01/10/24  6:13 PM   Specimen: BLOOD  Result Value Ref Range Status   Specimen Description   Final    BLOOD RIGHT ANTECUBITAL Performed at [facility], [address] [place], [place] [address]    Special Requests   Final    BOTTLES DRAWN AEROBIC AND ANAEROBIC Blood Culture adequate volume Performed at [facility], [address] [place], [place] [address]    Culture   Final    NO GROWTH 5 DAYS Performed at [facility], [address], [place], [place] [address]    Report Status 01/15/2024 FINAL  Final  MRSA Next Gen by PCR, Nasal     Status: None   Collection Time: 01/10/24 10:09 PM   Specimen: Nasal Mucosa; Nasal Swab  Result Value Ref Range Status   MRSA by PCR Next Gen NOT DETECTED NOT DETECTED Final    Comment: (NOTE) The GeneXpert MRSA Assay (FDA approved for NASAL specimens only), is one component of a comprehensive MRSA colonization surveillance program. It is not intended to diagnose MRSA infection nor to guide or monitor treatment for MRSA infections. Test performance is not FDA approved in patients less than 78 years old. Performed at [facility], [address], [place], [place] [address]   Urine Culture     Status: None   Collection Time: 01/10/24 11:58 PM   Specimen: Urine, Random  Result Value Ref Range Status   Specimen Description URINE, RANDOM  Final   Special Requests NONE Reflexed from [phone]  Final   Culture   Final    NO GROWTH Performed at [facility], [address], [place], [place] [address]    Report Status 01/12/2024 FINAL  Final  Culture, blood (Routine X 2) w Reflex to ID Panel     Status: None   Collection Time: 01/11/24  8:09 AM   Specimen: BLOOD LEFT HAND  Result Value Ref Range Status   Specimen Description BLOOD LEFT HAND  Final   Special Requests   Final    BOTTLES DRAWN AEROBIC AND ANAEROBIC Blood Culture adequate volume   Culture   Final    NO GROWTH 5 DAYS Performed at [facility], [address], [place], [place] [address]    Report Status 01/16/2024 FINAL  Final  Culture, blood (Routine X 2) w Reflex to ID Panel     Status: None   Collection Time: 01/11/24  8:15 AM   Specimen: BLOOD RIGHT HAND  Result Value Ref Range Status   Specimen Description BLOOD RIGHT HAND  Final   Special Requests   Final    BOTTLES DRAWN AEROBIC AND ANAEROBIC Blood Culture adequate volume   Culture   Final  NO GROWTH 5 DAYS Performed at [facility], [address] [place], [place] [address]    Report Status 01/16/2024 FINAL  Final         Radiology Studies: No results found.   Scheduled Meds:  Chlorhexidine  Gluconate Cloth  6 each Topical Daily   feeding supplement  237 mL Oral BID BM   gabapentin   100 mg Oral QID   heparin   5,000 Units Subcutaneous Q8H   heparin  sodium (porcine)  1,000 Units Intravenous Once   irbesartan   300 mg Oral Daily   linaclotide   290 mcg Oral QAC breakfast   multivitamin  1 tablet Oral QHS   mouth rinse  15 mL Mouth Rinse 4 times per day   polyethylene glycol  17 g Oral Daily   senna  1 tablet Oral BID   sodium chloride  flush  10-40 mL  Intracatheter Q12H   tamsulosin   0.4 mg Oral Daily   torsemide   20 mg Oral Daily   Continuous Infusions:     LOS: 7 days   Time spent:  Elsie JAYSON Montclair, DO [facility]  If 7PM-7AM, please contact night-coverage [email or web address]  01/17/2024, 7:21 AM      "

## 2024-01-17 NOTE — [place] FL2 (Signed)
 " [place]  MEDICAID FL2 LEVEL OF CARE FORM     IDENTIFICATION  Patient Name: Peter Bryan Birthdate: [birth year] Sex: male Admission Date (Current Location): 01/10/2024  [facility] and [place] Number:  Producer, Television/film/video and Address:  [facility], [address] [place], [place] [address]      Provider Number: [id number]  Attending Physician Name and Address:  Lue Elsie BROCKS, MD  Relative Name and Phone Number:  Gatsby, Chismar, Emergency Contact  [phone]    Current Level of Care: Hospital Recommended Level of Care: Skilled Nursing Facility Prior Approval Number:    Date Approved/Denied:   PASRR Number: [id number]  Discharge Plan: SNF    Current Diagnoses: Patient Active Problem List   Diagnosis Date Noted   Shock (HCC) 01/10/2024   Septic shock (HCC) 01/10/2024   Metabolic syndrome 09/03/2022   Mixed hyperlipidemia 09/03/2022   Family history of heart disease 01/21/2022   CKD (chronic kidney disease) stage 4, GFR 15-29 ml/min (HCC) 01/21/2022   Type 2 diabetes mellitus with complication, without long-term current use of insulin  (HCC) 01/21/2022   Essential hypertension 01/21/2022   AKI (acute kidney injury) 10/03/2021   OSA on CPAP 10/16/2009   OBESITY 03/13/2007   Sleep apnea 03/10/2007    Orientation RESPIRATION BLADDER Height & Weight     Self, Time, Situation, Place  Normal Incontinent Weight: 291 lb 0.1 oz (132 kg) Height:  5' 11 (180.3 cm)  BEHAVIORAL SYMPTOMS/MOOD NEUROLOGICAL BOWEL NUTRITION STATUS      Continent Diet (see dc summary)  AMBULATORY STATUS COMMUNICATION OF NEEDS Skin   Extensive Assist (Mod assist x2 per PT note) Verbally Normal                       Personal Care Assistance Level of Assistance  Bathing, Feeding, Dressing Bathing Assistance: Maximum assistance Feeding assistance: Limited assistance Dressing Assistance: Maximum assistance     Functional Limitations Info  Sight,  Hearing, Speech Sight Info: Adequate Hearing Info: Adequate Speech Info: Adequate    SPECIAL CARE FACTORS FREQUENCY  OT (By licensed OT), PT (By licensed PT)     PT Frequency: 5x/wk OT Frequency: 5x/wk            Contractures Contractures Info: Not present    Additional Factors Info  Code Status, Allergies Code Status Info: FULL Allergies Info: Nsaids           Current Medications (01/17/2024):  This is the current hospital active medication list Current Facility-Administered Medications  Medication Dose Route Frequency Provider Last Rate Last Admin   acetaminophen  (TYLENOL ) tablet 650 mg  650 mg Oral Q6H PRN Baral, Dipti, MD   650 mg at 01/12/24 1305   Chlorhexidine  Gluconate Cloth 2 % PADS 6 each  6 each Topical Daily Sharie Bourbon, MD   6 each at 01/17/24 0939   feeding supplement (ENSURE PLUS HIGH PROTEIN) liquid 237 mL  237 mL Oral BID BM Baral, Dipti, MD   237 mL at 01/17/24 0939   gabapentin  (NEURONTIN ) capsule 100 mg  100 mg Oral QID Baral, Dipti, MD   100 mg at 01/17/24 1430   heparin  injection 5,000 Units  5,000 Units Subcutaneous Q8H Hussein, Bourbon, MD   5,000 Units at 01/17/24 1430   heparin  sodium (porcine) injection 1,000 Units  1,000 Units Intravenous Once Desai, Rahul P, PA-C       HYDROcodone -acetaminophen  (NORCO/VICODIN) 5-325 MG per tablet 1 tablet  1 tablet Oral  Q6H PRN Lue Elsie BROCKS, MD   1 tablet at 01/17/24 1430   irbesartan  (AVAPRO ) tablet 300 mg  300 mg Oral Daily Lue Elsie BROCKS, MD   300 mg at 01/17/24 9061   linaclotide  (LINZESS ) capsule 290 mcg  290 mcg Oral QAC breakfast Baral, Dipti, MD       multivitamin (RENA-VIT) tablet 1 tablet  1 tablet Oral QHS Baral, Dipti, MD   1 tablet at 01/16/24 2219   Oral care mouth rinse  15 mL Mouth Rinse 4 times per day Pleas, Dipti, MD   15 mL at 01/17/24 9161   Oral care mouth rinse  15 mL Mouth Rinse PRN Baral, Dipti, MD       polyethylene glycol (MIRALAX  / GLYCOLAX ) packet 17 g  17 g Oral  Daily Baral, Dipti, MD   17 g at 01/12/24 0935   senna (SENOKOT) tablet 8.6 mg  1 tablet Oral BID Baral, Dipti, MD   8.6 mg at 01/17/24 9061   sodium chloride  flush (NS) 0.9 % injection 10-40 mL  10-40 mL Intracatheter Q12H Lue Elsie BROCKS, MD   10 mL at 01/16/24 2223   sodium chloride  flush (NS) 0.9 % injection 10-40 mL  10-40 mL Intracatheter PRN Lue Elsie BROCKS, MD       tamsulosin  (FLOMAX ) capsule 0.4 mg  0.4 mg Oral Daily Baral, Dipti, MD   0.4 mg at 01/17/24 9061   torsemide  (DEMADEX ) tablet 20 mg  20 mg Oral Daily Marlee Motto B, MD   20 mg at 01/17/24 9061   zolpidem  (AMBIEN ) tablet 10 mg  10 mg Oral QHS PRN Dewald, Jonathan B, MD   10 mg at 01/16/24 2344     Discharge Medications: Please see discharge summary for a list of discharge medications.  Relevant Imaging Results:  Relevant Lab Results:   Additional Information SSN: [id number]  Lendia Dais, LCSWA     "

## 2024-01-17 NOTE — TOC Progression Note (Signed)
 Transition of Care [facility]) - Progression Note    Patient Details  Name: Peter Bryan MRN: [id number] Date of Birth: June 11, 1960  Transition of Care [facility]) CM/SW Contact  Lendia Dais, [place] Phone Number: 01/17/2024, 2:48 PM  Clinical Narrative:  CSW spoke to the pt and their wife at bedside about PT recs of SNF. Pt was agreeable to referrals being sent out. Medicare.gov list left at bedside.  Pt's spouse [name] stated a preference of [facility]. CSW informed the pt and their spouse of SNF process and insurance auth needed. Pt and [name] stated understanding.   CSW has sent out referrals in the HUB and bed offers are pending.  CSW will continue to follow for offers.                      Expected Discharge Plan and Services                                               Social Drivers of Health (SDOH) Interventions SDOH Screenings   Food Insecurity: No Food Insecurity (01/13/2024)  Housing: Low Risk (01/13/2024)  Transportation Needs: No Transportation Needs (01/13/2024)  Utilities: Not At Risk (01/13/2024)  Financial Resource Strain: Patient Declined (08/13/2023)   Received from [facility]  Physical Activity: Inactive (08/13/2023)   [facility]  Social Connections: Socially Integrated (01/13/2024)  Stress: No Stress Concern Present (08/13/2023)   Received from [facility]  Tobacco Use: Low Risk (01/10/2024)    Readmission Risk Interventions    01/13/2024    1:16 PM  Readmission Risk Prevention Plan  Transportation Screening Complete  PCP or Specialist Appt within 5-7 Days Complete  Home Care Screening Complete  Medication Review (RN CM) Referral to Pharmacy

## 2024-01-17 NOTE — Plan of Care (Signed)

## 2024-01-17 NOTE — Progress Notes (Signed)
 [facility] Progress Note   Assessment/ Plan:    AKI on CKD IV:             - baseline 3.11 as of 10/2023 has resolved to baseline - 2/2 hemodynamic insults in setting of septic shock - Received CRRT through 01/12/2024 -Temporary HD catheter removed - will need followup on d/c; follows at [facility] nephrology with Nwobu.  He will need to call to schedule follow-up appointment  -No additional nephrology issues at the current time.  Will sign off.  2.  Septic shock:Resolved back on blood pressure meds                3.  Severe hyperkalemia: Resolved   4.  DM II             - per primary   5.  Edema: Continue torsemide  20 mg every morning.  Subjective:    Feels well no complaints other than some edema which is bothersome in his legs Creatinine stable at 3.09, K5.0 2.7 L of urine documented   Objective:   BP (!) 135/59 (BP Location: Left Arm)   Pulse 66   Temp 100 F (37.8 C) (Oral)   Resp 18   Ht 5' 11 (1.803 m)   Wt 132 kg   SpO2 93%   BMI 40.59 kg/m   Intake/Output Summary (Last 24 hours) at 01/17/2024 1312 Last data filed at 01/17/2024 9074 Gross per 24 hour  Intake 480 ml  Output 2600 ml  Net -2120 ml   Weight change:   Physical Exam: Physical Exam GEN appears better HEENT eomi perrl NECK bandaged PULM clear CV tachy ABD soft, mildly distended EXT compression hose in place, trace edema   Imaging: No results found.   Labs: BMET Recent Labs  Lab 01/10/24 2314 01/11/24 0541 01/11/24 1107 01/11/24 1505 01/12/24 0324 01/13/24 1418 01/14/24 1030 01/17/24 0017  NA 134* 133*  134* 135 135 138 141 138 136  K 6.2* 5.6*  5.7* 4.7 4.5 4.2 4.3 4.4 5.0  CL 108 106  107  --  104 103 104 103 101  CO2 12* 15*  14*  --  21* 27 27 26 24   GLUCOSE 142* 95  95  --  145* 78 153* 178* 111*  BUN 104* 90*  90*  --  69* 55* 55* 59* 78*  CREATININE 6.84* 5.82*  5.80*  --  4.43* 3.18* 3.05* 3.02* 3.09*  CALCIUM  8.4* 8.4*  8.2*  --  8.4* 8.7*  8.8* 8.6* 8.9  PHOS 6.5* 5.4*  5.3*  --  4.0 3.1 2.1* 2.2*  --    CBC Recent Labs  Lab 01/10/24 1758 01/10/24 2314 01/11/24 0541 01/11/24 1107 01/13/24 1418 01/14/24 1030  WBC 11.6* 10.6* 7.2  --  10.9* 11.7*  NEUTROABS 9.9* 8.6*  --   --   --   --   HGB 10.2* 10.2* 9.6* 8.5* 9.1* 8.4*  HCT 33.8* 33.0* 30.8* 25.0* 28.3* 26.8*  MCV 94.7 94.8 92.8  --  91.0 91.8  PLT 149* 142* 141*  --  124* 120*    Medications:     Chlorhexidine  Gluconate Cloth  6 each Topical Daily   feeding supplement  237 mL Oral BID BM   gabapentin   100 mg Oral QID   heparin   5,000 Units Subcutaneous Q8H   heparin  sodium (porcine)  1,000 Units Intravenous Once   irbesartan   300 mg Oral Daily   linaclotide   290 mcg Oral  QAC breakfast   multivitamin  1 tablet Oral QHS   mouth rinse  15 mL Mouth Rinse 4 times per day   polyethylene glycol  17 g Oral Daily   senna  1 tablet Oral BID   sodium chloride  flush  10-40 mL Intracatheter Q12H   tamsulosin   0.4 mg Oral Daily   torsemide   20 mg Oral Daily   Bernardino KATHEE Gasman, MD  01/17/2024, 1:12 PM

## 2024-01-17 NOTE — Progress Notes (Signed)
 Occupational Therapy Treatment Patient Details Name: Peter Bryan MRN: [id number] DOB: 14-Dec-1960 Today's Date: 01/17/2024   History of present illness 64 y.o. male admitted 01/10/24 with worsening weakness, AMS, SOB. Workup for septic shock, severe hyperkalemia, AKI on CKD IV requiring CRRT 1/6-1/8. PMH includes HTN, DM2, CKD IV, multiple back sxs, chronic pain syndrome.   OT comments  Pt progressing towards goals. Focus of session on progressing functional mobility, increasing independent engagement with ADL tasks, and addressing RUE impairments. Pt required up to Max A for functional transfers this date, as well as up to Total A for toileting tasks this date. Pt engaged in RUE AAROM exercises as noted below and education provided to Pt on importance of elevation to reduce edema. OT to continue to follow Pt acutely. Patient will benefit from continued inpatient follow up therapy, <3 hours/day.       If plan is discharge home, recommend the following:  Two people to help with walking and/or transfers;Two people to help with bathing/dressing/bathroom;Assistance with cooking/housework;Direct supervision/assist for medications management;Direct supervision/assist for financial management;Assist for transportation;Help with stairs or ramp for entrance   Equipment Recommendations  BSC/3in1;Tub/shower seat    Recommendations for Other Services      Precautions / Restrictions Precautions Precautions: Fall;Other (comment) Recall of Precautions/Restrictions: Impaired Precaution/Restrictions Comments: c/o R elbow pain and hand swollen and difficulty weight bearing (olecranon painful to palpation; seems like MD note attributing to gout flare up) Restrictions Weight Bearing Restrictions Per Provider Order: No       Mobility Bed Mobility               General bed mobility comments: Pt greeted sitting EOB and returned to recliner    Transfers Overall transfer level: Needs  assistance Equipment used: Rolling Jost (2 wheels) Transfers: Sit to/from Stand, Bed to chair/wheelchair/BSC Sit to Stand: Max assist     Step pivot transfers: Mod assist, +2 physical assistance, +2 safety/equipment     General transfer comment: able to stand on second attempt from elevated bed height to RW with mod  assist +2  for trunk elevation, heavy reliance on BUE support to push to stand, assist to bring RW closer once pt upright. Pt with heavy support on UEs. Was going to walk into bathroom but only took a few steps to 3N1 as he fatigues quickly and is in pain. Stood from the taller 3N1 with min assist of 2.  Cleaned pt and he walked back to the chair. Pt with additional stand from recliner, Max A to stand from recliner     Balance Overall balance assessment: Needs assistance Sitting-balance support: No upper extremity supported, Feet supported Sitting balance-Leahy Scale: Fair Sitting balance - Comments: CGA EOB   Standing balance support: Bilateral upper extremity supported, Reliant on assistive device for balance, During functional activity Standing balance-Leahy Scale: Poor Standing balance comment: relies on RW and external support                           ADL either performed or assessed with clinical judgement   ADL Overall ADL's : Needs assistance/impaired                         Toilet Transfer: Moderate assistance;+2 for physical assistance;+2 for safety/equipment;Stand-pivot;BSC/3in1;Rolling Bobb (2 wheels) Toilet Transfer Details (indicate cue type and reason): Stand pivot transfer to [facility] on L side. Toileting- Clothing Manipulation and Hygiene: Total assistance;+2 for safety/equipment;Sit to/from  stand Toileting - Clothing Manipulation Details (indicate cue type and reason): total A for posterior hygiene            Extremity/Trunk Assessment Upper Extremity Assessment Upper Extremity Assessment: Generalized weakness;Right hand  dominant;RUE deficits/detail;LUE deficits/detail RUE Deficits / Details: generalized weakness; AROM shoulder flexion to approximately 90 degress at baseline; all other AROM WFL; decreased coordination; hx of peripheral neuropathy. Edema noted in R hand, limiting full ROM RUE Sensation: history of peripheral neuropathy RUE Coordination: decreased fine motor;decreased gross motor LUE Deficits / Details: generalized weakness; AROM shoulder flexion to approximately 80 degress at baseline; all other AROM WFL; decreased coordination; hx of peripheral neuropathy LUE Sensation: history of peripheral neuropathy LUE Coordination: decreased fine motor;decreased gross motor            Vision       Perception     Praxis     Communication Communication Communication: No apparent difficulties Factors Affecting Communication: Other (comment) (pt requiring mildly increased time for processing and word finding)   Cognition Arousal: Alert Behavior During Therapy: Flat affect Cognition: No apparent impairments             OT - Cognition Comments: No apparent cognitive deficits observed this session                 Following commands: Intact        Cueing   Cueing Techniques: Verbal cues, Visual cues, Gestural cues  Exercises Exercises: Hand exercises Hand Exercises Wrist Flexion: AAROM, Right, 10 reps Wrist Extension: AAROM, Right, 10 reps Digit Composite Flexion: AAROM, Right, 10 reps Composite Extension: AAROM, Right, 10 reps Digit Composite Abduction: AROM, Right, 5 reps Digit Composite Adduction: AROM, Right, 5 reps    Shoulder Instructions       General Comments Educated Pt on edema management of RUE.    Pertinent Vitals/ Pain       Pain Assessment Pain Assessment: Faces Faces Pain Scale: Hurts whole lot Pain Location: R elbow and hand with AROM/weight bearing, bilateral feet (heaviness) Pain Descriptors / Indicators: Discomfort, Guarding Pain Intervention(s):  Limited activity within patient's tolerance, Monitored during session, Premedicated before session, Repositioned  Home Living                                          Prior Functioning/Environment              Frequency  Min 2X/week        Progress Toward Goals  OT Goals(current goals can now be found in the care plan section)  Progress towards OT goals: Progressing toward goals  Acute Rehab OT Goals Patient Stated Goal: to get better OT Goal Formulation: With patient/family Time For Goal Achievement: 01/26/24 Potential to Achieve Goals: Good ADL Goals Pt Will Perform Grooming: with contact guard assist;standing Pt Will Perform Upper Body Bathing: with contact guard assist;sitting (with adaptive equipment as needed) Pt Will Perform Lower Body Dressing: with min assist;sit to/from stand (with adaptive equipment as needed) Pt Will Transfer to Toilet: with contact guard assist;ambulating;regular height toilet;grab bars (least restrictive AD) Pt Will Perform Toileting - Clothing Manipulation and hygiene: with contact guard assist;with min assist;sit to/from stand  Plan      Co-evaluation    PT/OT/SLP Co-Evaluation/Treatment: Yes Reason for Co-Treatment: Complexity of the patient's impairments (multi-system involvement);For patient/therapist safety PT goals addressed during session: Mobility/safety with mobility;Balance;Proper use of  DME OT goals addressed during session: ADL's and self-care;Strengthening/ROM;Proper use of Adaptive equipment and DME      AM-PAC OT 6 Clicks Daily Activity     Outcome Measure   Help from another person eating meals?: A Little Help from another person taking care of personal grooming?: A Little Help from another person toileting, which includes using toliet, bedpan, or urinal?: Total Help from another person bathing (including washing, rinsing, drying)?: A Lot Help from another person to put on and taking off regular  upper body clothing?: A Lot Help from another person to put on and taking off regular lower body clothing?: Total 6 Click Score: 12    End of Session Equipment Utilized During Treatment: Gait belt;Rolling Lotter (2 wheels)  OT Visit Diagnosis: Unsteadiness on feet (R26.81);Other abnormalities of gait and mobility (R26.89);Muscle weakness (generalized) (M62.81);Other symptoms and signs involving cognitive function   Activity Tolerance Patient tolerated treatment well   Patient Left in chair;with call bell/phone within reach   Nurse Communication          Time: 8978-8947 OT Time Calculation (min): 31 min  Charges: OT General Charges $OT Visit: 1 Visit OT Treatments $Self Care/Home Management : 8-22 mins  Maurilio CROME, OTR/L.  [facility] Acute Rehabilitation  Office: [phone]   Maurilio PARAS Porter Nakama 01/17/2024, 12:42 PM

## 2024-01-18 DIAGNOSIS — R6521 Severe sepsis with septic shock: Secondary | ICD-10-CM | POA: Diagnosis not present

## 2024-01-18 DIAGNOSIS — N184 Chronic kidney disease, stage 4 (severe): Secondary | ICD-10-CM | POA: Diagnosis not present

## 2024-01-18 DIAGNOSIS — I10 Essential (primary) hypertension: Secondary | ICD-10-CM | POA: Diagnosis not present

## 2024-01-18 DIAGNOSIS — N179 Acute kidney failure, unspecified: Secondary | ICD-10-CM | POA: Diagnosis not present

## 2024-01-18 DIAGNOSIS — R579 Shock, unspecified: Secondary | ICD-10-CM | POA: Diagnosis not present

## 2024-01-18 DIAGNOSIS — A419 Sepsis, unspecified organism: Secondary | ICD-10-CM

## 2024-01-18 NOTE — Progress Notes (Signed)
 Nutrition Follow-up  DOCUMENTATION CODES:   Not applicable  INTERVENTION:   Transition back to renal diet as pt with good po intake Discontinue Ensures Continue renal MVI  Provided handouts that showed higher and lower potassium, sodium, and phosphorus foods Provided CKD nutrition therapy handout    NUTRITION DIAGNOSIS:   Increased nutrient needs related to acute illness as evidenced by estimated needs. - Still applicable   GOAL:   Patient will meet greater than or equal to 90% of their needs - Progressing   MONITOR:   PO intake, Supplement acceptance, Labs, I & O's  REASON FOR ASSESSMENT:   Consult Assessment of nutrition requirement/status (CRRT)  ASSESSMENT:   Pt with PMH significant for: T2DM, CKD, morbid obesity, chronic pain syndrome who presented with AMS. Admitted for septic shock, hyperkalemia, and metabolic acidosis.   1/06 - admitted 1/07 - CRRT initiated 1/08 - CRRT discontinued   Pt doing well on assessment, daughter in law at bedside. Pt reports appetite is very good had 100% of his breakfast and lunch today. Has been eating 50-100% of his meals. Pt now off CRRT, calorie and protein requirements are now decreased. Dicussed transition back to renal diet to help pt's CKD. Dicussed in detail what a renal diet is and what food to limit/avoid. Pt agreeable. Pt seems to have already had CKD education before.   Explained why diet restrictions are needed and provided handouts of foods to limit/avoid that are high potassium, sodium, and phosphorus. Provided specific recommendations on safer alternatives of these foods. Strongly encouraged compliance of this diet. Discussed need for fluid restriction with CKD.  UOP: 2.8L x 24 hours  Dispo: Medically ready for discharge, awaiting SNF approval for SNF  Admit weight: 137 kg - Fluid  Current weight: 133.5 kg    Average Meal Intake: 1/9-1/13: 83% intake x 8 recorded meals  Nutritionally Relevant  Medications: Scheduled Meds:  multivitamin  1 tablet Oral QHS   Labs Reviewed: Sodium 136 Potassium 5.0 Phosphorus 2.2 Bun 78 Creatinine 3.09 GFR 22 CBG ranges from 111-178 mg/dL over the last 24 hours HgbA1c 5.4  Diet Order:   Diet Order             Diet renal with fluid restriction Fluid restriction: 1200 mL Fluid; Room service appropriate? Yes; Fluid consistency: Thin  Diet effective now                   EDUCATION NEEDS:   Education needs have been addressed  Skin:  Skin Assessment: Reviewed RN Assessment  Last BM:  1/14, large, type 7  Height:   Ht Readings from Last 1 Encounters:  01/10/24 5' 11 (1.803 m)    Weight:   Wt Readings from Last 1 Encounters:  01/18/24 133.5 kg    Ideal Body Weight:  78.2 kg  BMI:  Body mass index is 41.05 kg/m.  Estimated Nutritional Needs:   Kcal:  2000-2200 kcals  Protein:  90-110 gm  Fluid:  1.2L fluid restriction per MD   Olivia Kenning, RD Registered Dietitian  See Amion for more information

## 2024-01-18 NOTE — TOC Progression Note (Signed)
 Transition of Care [facility]) - Progression Note    Patient Details  Name: Peter Bryan MRN: [id number] Date of Birth: 1960-04-09  Transition of Care [facility]) CM/SW Contact  Lendia Dais, [place] Phone Number: 01/18/2024, 10:26 AM  Clinical Narrative: CSW spoke to pt and pt's family member at bedside. CSW informed the pt most SNF's declined due to being out of network with their insurance and shared that only [facility] has accepted. Pt requested the CSW to follow up with pending facilities.  [facility] stated they are out of network with the [employer]. CSW is pending a response from [facility]. [facility] is full and [facility] has no bed availability.   CSW will continue to follow.                        Expected Discharge Plan and Services                                               Social Drivers of Health (SDOH) Interventions SDOH Screenings   Food Insecurity: No Food Insecurity (01/13/2024)  Housing: Low Risk (01/13/2024)  Transportation Needs: No Transportation Needs (01/13/2024)  Utilities: Not At Risk (01/13/2024)  Financial Resource Strain: Patient Declined (08/13/2023)   Received from [facility]  Physical Activity: Inactive (08/13/2023)   Received from [facility]  Social Connections: Socially Integrated (01/13/2024)  Stress: No Stress Concern Present (08/13/2023)   Received from [facility]  Tobacco Use: Low Risk (01/10/2024)    Readmission Risk Interventions    01/13/2024    1:16 PM  Readmission Risk Prevention Plan  Transportation Screening Complete  PCP or Specialist Appt within 5-7 Days Complete  Home Care Screening Complete  Medication Review (RN CM) Referral to Pharmacy

## 2024-01-18 NOTE — Progress Notes (Signed)
 "          [facility]                                                                              Leandrew Keech, is a 64 y.o. male, DOB - 09/04/1960, FMW:[id number] Admit date - 01/10/2024    Outpatient Primary MD for the patient is Turbyfill, Silvano I, NP  LOS - 8  days  Chief Complaint  Patient presents with   Fatigue       Brief summary   Patient is a 64 year old male with HTN, CKD stage IV, recurrent hyperkalemia, DM2, OSA, morbid obesity, multiple back surgeries, chronic pain syndrome presented to ED with altered mental status.  At baseline ambulates with a [place]/cane.  In ED was found to be profoundly hypotensive, hyperkalemia and severe metabolic acidosis initially required pressors and CRRT.  Pneumonia was presumed to be etiology for his septic shock.  Off of pressors as of 1/8 and was transferred to the floor on 1/9.  Awaiting SNF.  Assessment & Plan     Septic shock in the setting of left lower lobe pneumonia, resolved - CT chest abdomen pelvis showed opacity in the left lower lobe possibly representing early pneumonia. - Off pressors as of 1/8; hypothermic with leukocytosis and source LLL PNA noted - Completed doxycycline , ceftriaxone  on 1/11 -Overall improved, O2 sats 100% on room air    Acute metabolic encephalopathy, resolved - Multifactorial in the setting of shock, infection, uremia - Resolved, currently alert and oriented, at baseline confirmed by daughter-in-law at the bedside   AKI on CKD4, resolved Severe metabolic acidosis High anion gap metabolic acidosis, resolved Severe hyperkalemia-resolved - Presumably secondary to shock as above.  Nephrology consulted. - Improving, CRRT discontinued 1/8 -Temporary HD catheter removed. - Creatinine appears to be now stable and plateaued at 3.0.   Ambulatory dysfunction, acute  - PT OT evaluation recommended SNF   HTN  - Currently on torsemide , irbesartan   Gout exacerbation ruled out  - Uric acid  levels within normal limits   DM 2, non-insulin -dependent, well controlled  -HbA1c 5.4 well-controlled   BPH No acute issues    Mild malnutrition  Nutrition Problem: Increased nutrient needs Etiology: acute illness  Signs/Symptoms: estimated needs  Interventions: MVI, Ensure Enlive (each supplement provides 350kcal and 20 grams of protein), Refer to RD note for recommendations, Liberalize Diet   Obesity class III Estimated body mass index is 41.05 kg/m as calculated from the following:   Height as of this encounter: 5' 11 (1.803 m).   Weight as of this encounter: 133.5 kg.  Code Status: full  DVT Prophylaxis:  heparin  injection 5,000 Units Start: 01/11/24 0600 SCDs Start: 01/10/24 2209   Level of Care: Level of care: Telemetry Family Communication: Updated patient's daughter in law at the bed side  Disposition Plan:      Remains inpatient appropriate:   Awaiting SNF   Procedures:  HD  Consultants:   PCCM Nephrology  Antimicrobials:   Anti-infectives (From admission, onward)    Start     Dose/Rate Route Frequency Ordered Stop   01/11/24 1500  doxycycline  (VIBRAMYCIN ) 100 mg in sodium chloride  0.9 % 250 mL IVPB  100 mg 125 mL/hr over 120 Minutes Intravenous Every 12 hours 01/11/24 1427 01/16/24 0050   01/11/24 1400  cefTRIAXone  (ROCEPHIN ) 2 g in sodium chloride  0.9 % 100 mL IVPB        2 g 200 mL/hr over 30 Minutes Intravenous Every 24 hours 01/11/24 0952 01/15/24 1611   01/11/24 0800  vancomycin  (VANCOREADY) IVPB 1500 mg/300 mL  Status:  Discontinued        1,500 mg 150 mL/hr over 120 Minutes Intravenous Daily 01/10/24 2234 01/11/24 0950   01/11/24 0200  piperacillin -tazobactam (ZOSYN ) IVPB 3.375 g  Status:  Discontinued        3.375 g 100 mL/hr over 30 Minutes Intravenous Every 6 hours 01/10/24 2234 01/11/24 0952   01/10/24 1800  ceFEPIme  (MAXIPIME ) 2 g in sodium chloride  0.9 % 100 mL IVPB        2 g 200 mL/hr over 30 Minutes Intravenous  Once  01/10/24 1753 01/10/24 1847   01/10/24 1800  metroNIDAZOLE  (FLAGYL ) IVPB 500 mg        500 mg 100 mL/hr over 60 Minutes Intravenous  Once 01/10/24 1753 01/10/24 1919   01/10/24 1800  vancomycin  (VANCOCIN ) IVPB 1000 mg/200 mL premix        1,000 mg 200 mL/hr over 60 Minutes Intravenous  Once 01/10/24 1753 01/10/24 1952          Medications  Chlorhexidine  Gluconate Cloth  6 each Topical Daily   feeding supplement  237 mL Oral BID BM   gabapentin   100 mg Oral QID   heparin   5,000 Units Subcutaneous Q8H   heparin  sodium (porcine)  1,000 Units Intravenous Once   irbesartan   300 mg Oral Daily   linaclotide   290 mcg Oral QAC breakfast   multivitamin  1 tablet Oral QHS   mouth rinse  15 mL Mouth Rinse 4 times per day   polyethylene glycol  17 g Oral Daily   senna  1 tablet Oral BID   sodium chloride  flush  10-40 mL Intracatheter Q12H   tamsulosin   0.4 mg Oral Daily   torsemide   20 mg Oral Daily      Subjective:   Peter Bryan was seen and examined today.  No acute complaints, alert and oriented, awaiting SNF.   Objective:   Vitals:   01/17/24 2106 01/18/24 0544 01/18/24 0757 01/18/24 0757  BP: (!) 160/57 (!) 125/54  (!) 129/58  Pulse: 63 60  82  Resp: 18 14  18   Temp: 99.4 F (37.4 C) 98.5 F (36.9 C)  98.6 F (37 C)  TempSrc:    Oral  SpO2: 95% 96%  100%  Weight:   133.5 kg   Height:        Intake/Output Summary (Last 24 hours) at 01/18/2024 1431 Last data filed at 01/18/2024 1146 Gross per 24 hour  Intake --  Output 2250 ml  Net -2250 ml     Wt Readings from Last 3 Encounters:  01/18/24 133.5 kg  09/03/22 130.1 kg  01/21/22 (!) 139.5 kg     Exam General: Alert and oriented x 3, NAD Cardiovascular: S1 S2 auscultated,  RRR Respiratory: Clear to auscultation bilaterally, no wheezing, rales or rhonchi Gastrointestinal: Soft, nontender, nondistended, + bowel sounds Ext: no pedal edema bilaterally Neuro: No new deficits Psych: Normal affect     Data  Reviewed:  I have personally reviewed following labs    CBC Lab Results  Component Value Date   WBC 11.7 (H) 01/14/2024   RBC  2.92 (L) 01/14/2024   HGB 8.4 (L) 01/14/2024   HCT 26.8 (L) 01/14/2024   MCV 91.8 01/14/2024   MCH 28.8 01/14/2024   PLT 120 (L) 01/14/2024   MCHC 31.3 01/14/2024   RDW 13.4 01/14/2024   LYMPHSABS 1.1 01/10/2024   MONOABS 0.7 01/10/2024   EOSABS 0.0 01/10/2024   BASOSABS 0.0 01/10/2024     Last metabolic panel Lab Results  Component Value Date   NA 136 01/17/2024   K 5.0 01/17/2024   CL 101 01/17/2024   CO2 24 01/17/2024   BUN 78 (H) 01/17/2024   CREATININE 3.09 (H) 01/17/2024   GLUCOSE 111 (H) 01/17/2024   GFRNONAA 22 (L) 01/17/2024   GFRAA 52 (L) 03/07/2019   CALCIUM  8.9 01/17/2024   PHOS 2.2 (L) 01/14/2024   PROT 6.2 (L) 01/10/2024   ALBUMIN 3.5 01/14/2024   BILITOT 0.3 01/10/2024   ALKPHOS 72 01/10/2024   AST 11 (L) 01/10/2024   ALT 18 01/10/2024   ANIONGAP 11 01/17/2024    CBG (last 3)  No results for input(s): GLUCAP in the last 72 hours.    Coagulation Profile: No results for input(s): INR, PROTIME in the last 168 hours.   Radiology Studies: I have personally reviewed the imaging studies  No results found.     Nydia Distance M.D. [facility] 01/18/2024, 2:31 PM  Available via Epic secure chat 7am-7pm After 7 pm, please refer to night coverage provider listed on amion.    "

## 2024-01-18 NOTE — Plan of Care (Signed)

## 2024-01-19 DIAGNOSIS — R579 Shock, unspecified: Secondary | ICD-10-CM | POA: Diagnosis not present

## 2024-01-19 DIAGNOSIS — A419 Sepsis, unspecified organism: Secondary | ICD-10-CM | POA: Diagnosis not present

## 2024-01-19 DIAGNOSIS — N179 Acute kidney failure, unspecified: Secondary | ICD-10-CM | POA: Diagnosis not present

## 2024-01-19 DIAGNOSIS — R6521 Severe sepsis with septic shock: Secondary | ICD-10-CM | POA: Diagnosis not present

## 2024-01-19 LAB — BASIC METABOLIC PANEL WITH GFR
Anion gap: 11 (ref 5–15)
BUN: 85 mg/dL — ABNORMAL HIGH (ref 8–23)
CO2: 23 mmol/L (ref 22–32)
Calcium: 8.7 mg/dL — ABNORMAL LOW (ref 8.9–10.3)
Chloride: 99 mmol/L (ref 98–111)
Creatinine, Ser: 2.93 mg/dL — ABNORMAL HIGH (ref 0.61–1.24)
GFR, Estimated: 23 mL/min — ABNORMAL LOW
Glucose, Bld: 135 mg/dL — ABNORMAL HIGH (ref 70–99)
Potassium: 5.5 mmol/L — ABNORMAL HIGH (ref 3.5–5.1)
Sodium: 133 mmol/L — ABNORMAL LOW (ref 135–145)

## 2024-01-19 MED ORDER — SODIUM ZIRCONIUM CYCLOSILICATE 10 G PO PACK
10.0000 g | PACK | Freq: Once | ORAL | Status: AC
  Administered 2024-01-19: 10 g via ORAL
  Filled 2024-01-19: qty 1

## 2024-01-19 NOTE — Progress Notes (Signed)
 "          [facility]                                                                              Peter Bryan, is a 64 y.o. male, DOB - 05/21/1960, FMW:[id number] Admit date - 01/10/2024    Outpatient Primary MD for the patient is Bryan, Peter I, NP  LOS - 9  days  Chief Complaint  Patient presents with   Fatigue       Brief summary   Patient is a 64 year old male with HTN, CKD stage IV, recurrent hyperkalemia, DM2, OSA, morbid obesity, multiple back surgeries, chronic pain syndrome presented to ED with altered mental status.  At baseline ambulates with a [place]/cane.  In ED was found to be profoundly hypotensive, hyperkalemia and severe metabolic acidosis initially required pressors and CRRT.  Pneumonia was presumed to be etiology for his septic shock.  Off of pressors as of 1/8 and was transferred to the floor on 1/9.  Awaiting SNF.  Assessment & Plan     Septic shock in the setting of left lower lobe pneumonia, resolved -; hypothermic with leukocytosis and source LLL PNA noted - CT chest abdomen pelvis showed opacity in the left lower lobe possibly representing early pneumonia. - Off pressors as of 1/8 - Completed doxycycline , ceftriaxone  on 1/11 -Overall improved, on room air    Acute metabolic encephalopathy, resolved - Multifactorial in the setting of shock, infection, uremia - Resolved, currently alert and oriented, at baseline   AKI on CKD4, resolved Severe metabolic acidosis High anion gap metabolic acidosis, resolved Severe hyperkalemia-resolved - Presumably secondary to shock as above.  Nephrology consulted. - Improving, CRRT discontinued 1/8 -Temporary HD catheter removed. - Creatinine improving, 2.9  Mild hyperkalemia -K5.5, Lokelma  10 g p.o. x 1 -Hold ARB/irbesartan    Ambulatory dysfunction, acute  - PT OT evaluation recommended SNF   HTN  - Currently on torsemide    Gout exacerbation ruled out  - Uric acid levels within normal  limits   DM 2, non-insulin -dependent, well controlled  -HbA1c 5.4 well-controlled   BPH No acute issues    Mild malnutrition  Nutrition Problem: Increased nutrient needs Etiology: acute illness  Signs/Symptoms: estimated needs  Interventions: MVI, Ensure Enlive (each supplement provides 350kcal and 20 grams of protein), Refer to RD note for recommendations, Liberalize Diet   Obesity class III Estimated body mass index is 41.05 kg/m as calculated from the following:   Height as of this encounter: 5' 11 (1.803 m).   Weight as of this encounter: 133.5 kg.  Code Status: full  DVT Prophylaxis:  heparin  injection 5,000 Units Start: 01/11/24 0600 SCDs Start: 01/10/24 2209   Level of Care: Level of care: Telemetry Family Communication: Updated patient's daughter in law at the bed side on 1/14 Disposition Plan:      Remains inpatient appropriate:   Awaiting SNF   Procedures:  HD  Consultants:   PCCM Nephrology  Antimicrobials:   Anti-infectives (From admission, onward)    Start     Dose/Rate Route Frequency Ordered Stop   01/11/24 1500  doxycycline  (VIBRAMYCIN ) 100 mg in sodium chloride  0.9 % 250 mL IVPB  100 mg 125 mL/hr over 120 Minutes Intravenous Every 12 hours 01/11/24 1427 01/16/24 0050   01/11/24 1400  cefTRIAXone  (ROCEPHIN ) 2 g in sodium chloride  0.9 % 100 mL IVPB        2 g 200 mL/hr over 30 Minutes Intravenous Every 24 hours 01/11/24 0952 01/15/24 1611   01/11/24 0800  vancomycin  (VANCOREADY) IVPB 1500 mg/300 mL  Status:  Discontinued        1,500 mg 150 mL/hr over 120 Minutes Intravenous Daily 01/10/24 2234 01/11/24 0950   01/11/24 0200  piperacillin -tazobactam (ZOSYN ) IVPB 3.375 g  Status:  Discontinued        3.375 g 100 mL/hr over 30 Minutes Intravenous Every 6 hours 01/10/24 2234 01/11/24 0952   01/10/24 1800  ceFEPIme  (MAXIPIME ) 2 g in sodium chloride  0.9 % 100 mL IVPB        2 g 200 mL/hr over 30 Minutes Intravenous  Once 01/10/24 1753  01/10/24 1847   01/10/24 1800  metroNIDAZOLE  (FLAGYL ) IVPB 500 mg        500 mg 100 mL/hr over 60 Minutes Intravenous  Once 01/10/24 1753 01/10/24 1919   01/10/24 1800  vancomycin  (VANCOCIN ) IVPB 1000 mg/200 mL premix        1,000 mg 200 mL/hr over 60 Minutes Intravenous  Once 01/10/24 1753 01/10/24 1952          Medications  Chlorhexidine  Gluconate Cloth  6 each Topical Daily   gabapentin   100 mg Oral QID   heparin   5,000 Units Subcutaneous Q8H   linaclotide   290 mcg Oral QAC breakfast   multivitamin  1 tablet Oral QHS   mouth rinse  15 mL Mouth Rinse 4 times per day   polyethylene glycol  17 g Oral Daily   senna  1 tablet Oral BID   sodium chloride  flush  10-40 mL Intracatheter Q12H   tamsulosin   0.4 mg Oral Daily   torsemide   20 mg Oral Daily      Subjective:   Peter Bryan was seen and examined today.  No acute complaints, watching movie on laptop  Awaiting SNF.    Objective:   Vitals:   01/18/24 1652 01/18/24 1944 01/18/24 2049 01/19/24 0527  BP: (!) 122/55 (!) 131/57  (!) 126/53  Pulse: (!) 58 65  60  Resp: 18 18 (!) 21 18  Temp: (!) 100.4 F (38 C) 99.6 F (37.6 C)  99.5 F (37.5 C)  TempSrc: Oral Oral  Oral  SpO2: 95% 95%  95%  Weight:      Height:        Intake/Output Summary (Last 24 hours) at 01/19/2024 1159 Last data filed at 01/19/2024 0602 Gross per 24 hour  Intake --  Output 2800 ml  Net -2800 ml     Wt Readings from Last 3 Encounters:  01/18/24 133.5 kg  09/03/22 130.1 kg  01/21/22 (!) 139.5 kg   Physical Exam General: Alert and oriented x 3, NAD Cardiovascular: S1 S2 clear, RRR.  Respiratory: CTAB, no wheezing Gastrointestinal: Soft, nontender, nondistended, NBS Ext: no pedal edema bilaterally, ted hose on Neuro: no new deficits Psych: Normal affect     Data Reviewed:  I have personally reviewed following labs    CBC Lab Results  Component Value Date   WBC 11.7 (H) 01/14/2024   RBC 2.92 (L) 01/14/2024   HGB 8.4 (L)  01/14/2024   HCT 26.8 (L) 01/14/2024   MCV 91.8 01/14/2024   MCH 28.8 01/14/2024   PLT 120 (  L) 01/14/2024   MCHC 31.3 01/14/2024   RDW 13.4 01/14/2024   LYMPHSABS 1.1 01/10/2024   MONOABS 0.7 01/10/2024   EOSABS 0.0 01/10/2024   BASOSABS 0.0 01/10/2024     Last metabolic panel Lab Results  Component Value Date   NA 133 (L) 01/18/2024   K 5.5 (H) 01/18/2024   CL 99 01/18/2024   CO2 23 01/18/2024   BUN 85 (H) 01/18/2024   CREATININE 2.93 (H) 01/18/2024   GLUCOSE 135 (H) 01/18/2024   GFRNONAA 23 (L) 01/18/2024   GFRAA 52 (L) 03/07/2019   CALCIUM  8.7 (L) 01/18/2024   PHOS 2.2 (L) 01/14/2024   PROT 6.2 (L) 01/10/2024   ALBUMIN 3.5 01/14/2024   BILITOT 0.3 01/10/2024   ALKPHOS 72 01/10/2024   AST 11 (L) 01/10/2024   ALT 18 01/10/2024   ANIONGAP 11 01/18/2024    CBG (last 3)  No results for input(s): GLUCAP in the last 72 hours.    Coagulation Profile: No results for input(s): INR, PROTIME in the last 168 hours.   Radiology Studies: I have personally reviewed the imaging studies  No results found.     Nydia Distance M.D. [facility] 01/19/2024, 11:59 AM  Available via Epic secure chat 7am-7pm After 7 pm, please refer to night coverage provider listed on amion.    "

## 2024-01-19 NOTE — Progress Notes (Signed)
 Mobility Specialist: Progress Note   01/19/24 1300  Mobility  Activity Ambulated with assistance  Level of Assistance Minimal assist, patient does 75% or more (+ chair follow)  Assistive Device Front wheel Walsh  Distance Ambulated (ft) 28 ft (3+25)  Activity Response Tolerated well  Mobility Referral Yes  Mobility visit 1 Mobility  Mobility Specialist Start Time (ACUTE ONLY) 1047  Mobility Specialist Stop Time (ACUTE ONLY) 1109  Mobility Specialist Time Calculation (min) (ACUTE ONLY) 22 min    Pt received in bed, agreeable to mobility session. SV for bed mobility. Heavy modA for STS from EOB. MinA for ambulation with chair follow. Ambulated a few feet and took a seated rest d/t fatigue and LE pain in foot and ankle. Donned shoes and foot pain improved afterwards. ModA for STS from chair, and ambulated to the outside door of his room and back to bed without fault. Tolerated very well. Declined siting up in the chair at this time. Returned to supine with SV. Left in bed with all needs met, call bell in reach. Wife present.  Ileana Lute Mobility Specialist Please contact via SecureChat or Rehab office at [phone]

## 2024-01-19 NOTE — Plan of Care (Signed)
" °  Problem: Education: Goal: Knowledge of General Education information will improve Description: Including pain rating scale, medication(s)/side effects and non-pharmacologic comfort measures Outcome: Progressing   Problem: Clinical Measurements: Goal: Ability to maintain clinical measurements within normal limits will improve Outcome: Progressing   Problem: Clinical Measurements: Goal: Diagnostic test results will improve Outcome: Progressing   Problem: Clinical Measurements: Goal: Cardiovascular complication will be avoided Outcome: Progressing   Problem: Clinical Measurements: Goal: Cardiovascular complication will be avoided Outcome: Progressing   "

## 2024-01-19 NOTE — Progress Notes (Signed)
 Physical Therapy Treatment Patient Details Name: Peter Bryan MRN: [id number] DOB: July 20, 1960 Today's Date: 01/19/2024   History of Present Illness 64 y.o. male admitted 01/10/24 with worsening weakness, AMS, SOB. Workup for septic shock, severe hyperkalemia, AKI on CKD IV requiring CRRT 1/6-1/8. PMH includes HTN, DM2, CKD IV, multiple back sxs, chronic pain syndrome.    PT Comments  Pt admitted with above diagnosis. Pt able to progress distance with ambulation with mod assist of 2 for safety.  Leans heavily on UEs on RW and widens BOS significantly.  Pt currently with functional limitations due to the deficits listed below (see PT Problem List). Pt will benefit from acute skilled PT to increase their independence and safety with mobility to allow discharge.       If plan is discharge home, recommend the following: A lot of help with walking and/or transfers;A lot of help with bathing/dressing/bathroom;Assistance with cooking/housework;Assist for transportation;Help with stairs or ramp for entrance   Can travel by private vehicle        Equipment Recommendations   (TBD next venue, though fairly well equipped, may need wheelchair)    Recommendations for Other Services       Precautions / Restrictions Precautions Precautions: Fall;Other (comment) Recall of Precautions/Restrictions: Impaired Precaution/Restrictions Comments: c/o R elbow pain and hand swollen and difficulty weight bearing (olecranon painful to palpation; seems like MD note attributing to gout flare up) Restrictions Weight Bearing Restrictions Per Provider Order: No     Mobility  Bed Mobility Overal bed mobility: Needs Assistance             General bed mobility comments: Pt greeted sitting EOB and returned to EOB end of session    Transfers Overall transfer level: Needs assistance Equipment used: Rolling Buzby (2 wheels) Transfers: Sit to/from Stand, Bed to chair/wheelchair/BSC Sit to Stand: Max assist, +2  physical assistance, From elevated surface           General transfer comment: able to stand on second attempt from elevated bed height to RW with mod  assist +2  and max assist +2 from lower height for trunk elevation, heavy reliance on BUE support to push to stand, assist to bring RW closer once pt upright. Pt with heavy support on UEs. Pt ambulated a little and determined to move the RW height therefore pt sat down on chair for a few min. Pt with additional stand from recliner, Mod A to stand from recliner with pt stating it was easier to stand after first attempt from bed and after he walked a bit. Pt widens BOS for standing and walking.    Ambulation/Gait Ambulation/Gait assistance: Mod assist, +2 safety/equipment Gait Distance (Feet): 45 Feet (10 + 45 feet) Assistive device: Rolling Bouchillon (2 wheels) Gait Pattern/deviations: Step-through pattern, Decreased stride length, Decreased step length - right, Decreased step length - left, Decreased dorsiflexion - left, Antalgic, Drifts right/left, Trunk flexed, Wide base of support   Gait velocity interpretation: <1.31 ft/sec, indicative of household ambulator   General Gait Details: Pt ambulated with RW with varying assist at times needing min and up to mod assist at times for safety as pt fatigues and has pain in bil LEs as well.  Cues for sequencing steps and RW as well.  Needed assist to steer RW as well. Followed closely with chair for safety however pt was able to walk to hallway, turn and make it back to bed.   Stairs  Wheelchair Mobility     Tilt Bed    Modified Rankin (Stroke Patients Only)       Balance Overall balance assessment: Needs assistance Sitting-balance support: No upper extremity supported, Feet supported Sitting balance-Leahy Scale: Fair Sitting balance - Comments: CGA EOB   Standing balance support: Bilateral upper extremity supported, Reliant on assistive device for balance, During  functional activity Standing balance-Leahy Scale: Poor Standing balance comment: relies on RW and external support                            Communication Communication Communication: No apparent difficulties  Cognition Arousal: Alert Behavior During Therapy: Flat affect   PT - Cognitive impairments: Awareness, Attention, Initiation, Sequencing, Problem solving                       PT - Cognition Comments: A&Ox4, slowed processing and initiation at times, repetitive during session; family reports WFL baseline Following commands: Intact      Cueing Cueing Techniques: Verbal cues, Visual cues, Gestural cues  Exercises General Exercises - Lower Extremity Long Arc Quad: AAROM, Both, 5 reps, Seated    General Comments        Pertinent Vitals/Pain Pain Assessment Pain Assessment: Faces Faces Pain Scale: Hurts whole lot Pain Location: R elbow and hand with AROM/weight bearing, bilateral feet (heaviness) Pain Descriptors / Indicators: Discomfort, Guarding Pain Intervention(s): Limited activity within patient's tolerance, Monitored during session, Repositioned, Patient requesting pain meds-RN notified    Home Living                          Prior Function            PT Goals (current goals can now be found in the care plan section) Acute Rehab PT Goals Patient Stated Goal: get well, return home Progress towards PT goals: Progressing toward goals    Frequency    Min 2X/week      PT Plan      Co-evaluation              AM-PAC PT 6 Clicks Mobility   Outcome Measure  Help needed turning from your back to your side while in a flat bed without using bedrails?: A Little Help needed moving from lying on your back to sitting on the side of a flat bed without using bedrails?: A Little Help needed moving to and from a bed to a chair (including a wheelchair)?: Total Help needed standing up from a chair using your arms (e.g.,  wheelchair or bedside chair)?: Total Help needed to walk in hospital room?: Total Help needed climbing 3-5 steps with a railing? : Total 6 Click Score: 10    End of Session Equipment Utilized During Treatment: Gait belt Activity Tolerance: Patient limited by fatigue;Patient limited by pain Patient left: with call bell/phone within reach;in bed;with family/visitor present (sitting on EOB) Nurse Communication: Mobility status;Need for lift equipment Laurent) PT Visit Diagnosis: Unsteadiness on feet (R26.81);Other abnormalities of gait and mobility (R26.89);Muscle weakness (generalized) (M62.81);History of falling (Z91.81);Difficulty in walking, not elsewhere classified (R26.2);Other symptoms and signs involving the nervous system (R29.898);Pain     Time: 8859-8784 PT Time Calculation (min) (ACUTE ONLY): 35 min  Charges:    $Gait Training: 23-37 mins PT General Charges $$ ACUTE PT VISIT: 1 Visit  [facility] M,PT Acute Rehab Services [phone]    Stephane JULIANNA Bevel 01/19/2024, 2:10 PM

## 2024-01-19 NOTE — TOC Progression Note (Signed)
 Transition of Care [facility]) - Progression Note    Patient Details  Name: Peter Bryan MRN: [id number] Date of Birth: 1960-06-29  Transition of Care [facility]) CM/SW Contact  Lendia Dais, [place] Phone Number: 01/19/2024, 4:29 PM  Clinical Narrative:  CSW spoke to pt at bedside about bed offers. Pt 's daughter Reche is a NP and has a preference of [place]. CSW inquired if the pt would be agreeable to the SNF preference of their daughter and the pt verbalized agreement.  CSW spoke to pt's spouse (Caitlin's number isn't in the chart) and stated that they were agreeable to moving forward with [facility] per Caitlin's request.   CSW informed [facility] [place] and requested for them to start auth through the pt's insurance.  CSW will continue to monitor for insurance auth.                      Expected Discharge Plan and Services                                               Social Drivers of Health (SDOH) Interventions SDOH Screenings   Food Insecurity: No Food Insecurity (01/13/2024)  Housing: Low Risk (01/13/2024)  Transportation Needs: No Transportation Needs (01/13/2024)  Utilities: Not At Risk (01/13/2024)  Financial Resource Strain: Patient Declined (08/13/2023)   Received from [facility]  Physical Activity: Inactive (08/13/2023)   Received from [facility]  Social Connections: Socially Integrated (01/13/2024)  Stress: No Stress Concern Present (08/13/2023)   Received from [facility]  Tobacco Use: Low Risk (01/10/2024)    Readmission Risk Interventions    01/13/2024    1:16 PM  Readmission Risk Prevention Plan  Transportation Screening Complete  PCP or Specialist Appt within 5-7 Days Complete  Home Care Screening Complete  Medication Review (RN CM) Referral to Pharmacy

## 2024-01-20 DIAGNOSIS — I10 Essential (primary) hypertension: Secondary | ICD-10-CM | POA: Diagnosis not present

## 2024-01-20 DIAGNOSIS — N184 Chronic kidney disease, stage 4 (severe): Secondary | ICD-10-CM | POA: Diagnosis not present

## 2024-01-20 DIAGNOSIS — N179 Acute kidney failure, unspecified: Secondary | ICD-10-CM | POA: Diagnosis not present

## 2024-01-20 DIAGNOSIS — R579 Shock, unspecified: Secondary | ICD-10-CM | POA: Diagnosis not present

## 2024-01-20 LAB — BASIC METABOLIC PANEL WITH GFR
Anion gap: 11 (ref 5–15)
BUN: 80 mg/dL — ABNORMAL HIGH (ref 8–23)
CO2: 22 mmol/L (ref 22–32)
Calcium: 9.1 mg/dL (ref 8.9–10.3)
Chloride: 100 mmol/L (ref 98–111)
Creatinine, Ser: 2.86 mg/dL — ABNORMAL HIGH (ref 0.61–1.24)
GFR, Estimated: 24 mL/min — ABNORMAL LOW
Glucose, Bld: 128 mg/dL — ABNORMAL HIGH (ref 70–99)
Potassium: 5.1 mmol/L (ref 3.5–5.1)
Sodium: 133 mmol/L — ABNORMAL LOW (ref 135–145)

## 2024-01-20 MED ORDER — POLYETHYLENE GLYCOL 3350 17 G PO PACK: 17.0000 g | PACK | Freq: Every day | ORAL | Status: DC

## 2024-01-20 MED ORDER — BACLOFEN 10 MG PO TABS
5.0000 mg | ORAL_TABLET | Freq: Every day | ORAL | Status: DC
  Administered 2024-01-20 – 2024-01-22 (×3): 5 mg via ORAL
  Filled 2024-01-20 (×3): qty 1

## 2024-01-20 MED ORDER — TAMSULOSIN HCL 0.4 MG PO CAPS: 0.4000 mg | ORAL_CAPSULE | Freq: Every day | ORAL | Status: AC

## 2024-01-20 MED ORDER — VITAMIN D 50 MCG (2000 UT) PO TABS: 2000.0000 [IU] | ORAL_TABLET | Freq: Every day | ORAL | Status: AC

## 2024-01-20 MED ORDER — HYDROCODONE-ACETAMINOPHEN 5-325 MG PO TABS: 1.0000 | ORAL_TABLET | Freq: Four times a day (QID) | ORAL | 0 refills | Status: AC | PRN

## 2024-01-20 MED ORDER — ZOLPIDEM TARTRATE 10 MG PO TABS: 10.0000 mg | ORAL_TABLET | Freq: Every evening | ORAL | Status: AC | PRN

## 2024-01-20 MED ORDER — SENNA 8.6 MG PO TABS: 1.0000 | ORAL_TABLET | Freq: Two times a day (BID) | ORAL | Status: AC

## 2024-01-20 MED ORDER — GABAPENTIN 100 MG PO CAPS: 100.0000 mg | ORAL_CAPSULE | Freq: Four times a day (QID) | ORAL | Status: AC

## 2024-01-20 MED ORDER — TORSEMIDE 20 MG PO TABS: 20.0000 mg | ORAL_TABLET | Freq: Every day | ORAL | Status: DC

## 2024-01-20 MED ORDER — RENA-VITE PO TABS: 1.0000 | ORAL_TABLET | Freq: Every day | ORAL | Status: AC

## 2024-01-20 MED ORDER — ACETAMINOPHEN 325 MG PO TABS: 650.0000 mg | ORAL_TABLET | Freq: Four times a day (QID) | ORAL | Status: AC | PRN

## 2024-01-20 NOTE — Progress Notes (Signed)
 "          [facility]                                                                              Peter Bryan, is a 64 y.o. male, DOB - 1960/04/26, FMW:[id number] Admit date - 01/10/2024    Outpatient Primary MD for the patient is Turbyfill, Silvano I, NP  LOS - 10  days  Chief Complaint  Patient presents with   Fatigue       Brief summary   Patient is a 64 year old male with HTN, CKD stage IV, recurrent hyperkalemia, DM2, OSA, morbid obesity, multiple back surgeries, chronic pain syndrome presented to ED with altered mental status.  At baseline ambulates with a Lipari/cane.  In ED was found to be profoundly hypotensive, hyperkalemia and severe metabolic acidosis initially required pressors and CRRT.  Pneumonia was presumed to be etiology for his septic shock.  Off of pressors as of 1/8 and was transferred to the floor on 1/9.  Awaiting SNF.  Assessment & Plan     Septic shock in the setting of left lower lobe pneumonia, resolved -; hypothermic with leukocytosis and source LLL PNA noted - CT chest abdomen pelvis showed opacity in the left lower lobe possibly representing early pneumonia. - Off pressors as of 1/8 - Completed doxycycline , ceftriaxone  on 1/11 - No acute issues, on room air    Acute metabolic encephalopathy, resolved - Multifactorial in the setting of shock, infection, uremia - Resolved, currently alert and oriented, at baseline   AKI on CKD4, resolved Severe metabolic acidosis High anion gap metabolic acidosis, resolved Severe hyperkalemia-resolved - Presumably secondary to shock as above.  Nephrology consulted. - Improving, CRRT discontinued 1/8 -Temporary HD catheter removed. - Creatinine continues to improve, 2.8  Mild hyperkalemia -K improving -Hold ARB/irbesartan    Ambulatory dysfunction, acute  - PT OT evaluation recommended SNF   HTN  - Currently on torsemide    Gout exacerbation ruled out  - Uric acid levels within normal  limits   DM 2, non-insulin -dependent, well controlled  -HbA1c 5.4 well-controlled   BPH No acute issues    Mild malnutrition  Nutrition Problem: Increased nutrient needs Etiology: acute illness  Signs/Symptoms: estimated needs  Interventions: MVI, Ensure Enlive (each supplement provides 350kcal and 20 grams of protein), Refer to RD note for recommendations, Liberalize Diet   Obesity class III Estimated body mass index is 41.05 kg/m as calculated from the following:   Height as of this encounter: 5' 11 (1.803 m).   Weight as of this encounter: 133.5 kg.  Code Status: full  DVT Prophylaxis:  heparin  injection 5,000 Units Start: 01/11/24 0600 SCDs Start: 01/10/24 2209   Level of Care: Level of care: Telemetry Family Communication: Updated patient's daughter in law at the bed side on 1/14 Disposition Plan:      Remains inpatient appropriate:   Awaiting SNF, no acute medical issues   Procedures:  HD  Consultants:   PCCM Nephrology  Antimicrobials:   Anti-infectives (From admission, onward)    Start     Dose/Rate Route Frequency Ordered Stop   01/11/24 1500  doxycycline  (VIBRAMYCIN ) 100 mg in sodium chloride  0.9 % 250 mL  IVPB        100 mg 125 mL/hr over 120 Minutes Intravenous Every 12 hours 01/11/24 1427 01/16/24 0050   01/11/24 1400  cefTRIAXone  (ROCEPHIN ) 2 g in sodium chloride  0.9 % 100 mL IVPB        2 g 200 mL/hr over 30 Minutes Intravenous Every 24 hours 01/11/24 0952 01/15/24 1611   01/11/24 0800  vancomycin  (VANCOREADY) IVPB 1500 mg/300 mL  Status:  Discontinued        1,500 mg 150 mL/hr over 120 Minutes Intravenous Daily 01/10/24 2234 01/11/24 0950   01/11/24 0200  piperacillin -tazobactam (ZOSYN ) IVPB 3.375 g  Status:  Discontinued        3.375 g 100 mL/hr over 30 Minutes Intravenous Every 6 hours 01/10/24 2234 01/11/24 0952   01/10/24 1800  ceFEPIme  (MAXIPIME ) 2 g in sodium chloride  0.9 % 100 mL IVPB        2 g 200 mL/hr over 30 Minutes Intravenous   Once 01/10/24 1753 01/10/24 1847   01/10/24 1800  metroNIDAZOLE  (FLAGYL ) IVPB 500 mg        500 mg 100 mL/hr over 60 Minutes Intravenous  Once 01/10/24 1753 01/10/24 1919   01/10/24 1800  vancomycin  (VANCOCIN ) IVPB 1000 mg/200 mL premix        1,000 mg 200 mL/hr over 60 Minutes Intravenous  Once 01/10/24 1753 01/10/24 1952          Medications  Chlorhexidine  Gluconate Cloth  6 each Topical Daily   gabapentin   100 mg Oral QID   heparin   5,000 Units Subcutaneous Q8H   linaclotide   290 mcg Oral QAC breakfast   multivitamin  1 tablet Oral QHS   polyethylene glycol  17 g Oral Daily   senna  1 tablet Oral BID   sodium chloride  flush  10-40 mL Intracatheter Q12H   tamsulosin   0.4 mg Oral Daily   torsemide   20 mg Oral Daily      Subjective:   Peter Bryan was seen and examined today.  No acute complaints, awaiting SNF.   Objective:   Vitals:   01/19/24 1600 01/19/24 2058 01/20/24 0455 01/20/24 0857  BP: (!) 127/52 (!) 123/55 (!) 122/54 129/70  Pulse: (!) 58 (!) 55 (!) 51 69  Resp: 17 18 18 18   Temp: 99.5 F (37.5 C) 98.2 F (36.8 C) 98.5 F (36.9 C) 98.9 F (37.2 C)  TempSrc: Oral     SpO2: 95% 97% 94% 95%  Weight:      Height:        Intake/Output Summary (Last 24 hours) at 01/20/2024 1200 Last data filed at 01/20/2024 0603 Gross per 24 hour  Intake 0 ml  Output 2300 ml  Net -2300 ml     Wt Readings from Last 3 Encounters:  01/18/24 133.5 kg  09/03/22 130.1 kg  01/21/22 (!) 139.5 kg   Physical Exam General: Alert and oriented x 3, NAD Cardiovascular: S1 S2 clear, RRR.  Respiratory: CTAB, no wheezing Gastrointestinal: Soft, nontender, nondistended, NBS Ext: no pedal edema bilaterally Neuro: no new deficits Psych: Normal affect     Data Reviewed:  I have personally reviewed following labs    CBC Lab Results  Component Value Date   WBC 11.7 (H) 01/14/2024   RBC 2.92 (L) 01/14/2024   HGB 8.4 (L) 01/14/2024   HCT 26.8 (L) 01/14/2024   MCV 91.8  01/14/2024   MCH 28.8 01/14/2024   PLT 120 (L) 01/14/2024   MCHC 31.3 01/14/2024   RDW  13.4 01/14/2024   LYMPHSABS 1.1 01/10/2024   MONOABS 0.7 01/10/2024   EOSABS 0.0 01/10/2024   BASOSABS 0.0 01/10/2024     Last metabolic panel Lab Results  Component Value Date   NA 133 (L) 01/20/2024   K 5.1 01/20/2024   CL 100 01/20/2024   CO2 22 01/20/2024   BUN 80 (H) 01/20/2024   CREATININE 2.86 (H) 01/20/2024   GLUCOSE 128 (H) 01/20/2024   GFRNONAA 24 (L) 01/20/2024   GFRAA 52 (L) 03/07/2019   CALCIUM  9.1 01/20/2024   PHOS 2.2 (L) 01/14/2024   PROT 6.2 (L) 01/10/2024   ALBUMIN 3.5 01/14/2024   BILITOT 0.3 01/10/2024   ALKPHOS 72 01/10/2024   AST 11 (L) 01/10/2024   ALT 18 01/10/2024   ANIONGAP 11 01/20/2024    CBG (last 3)  No results for input(s): GLUCAP in the last 72 hours.    Coagulation Profile: No results for input(s): INR, PROTIME in the last 168 hours.   Radiology Studies: I have personally reviewed the imaging studies  No results found.     Nydia Distance M.D. [facility] 01/20/2024, 12:00 PM  Available via Epic secure chat 7am-7pm After 7 pm, please refer to night coverage provider listed on amion.    "

## 2024-01-20 NOTE — TOC Progression Note (Signed)
 Transition of Care [facility]) - Progression Note    Patient Details  Name: TARIUS STANGELO MRN: [id number] Date of Birth: November 09, 1960  Transition of Care [facility]) CM/SW Contact  Lendia Dais, LCSWA Phone Number: 01/20/2024, 4:27 PM  Clinical Narrative: [facility] [place] stated they can take the pt over the weekend as long as the pt arrives before 2PM. MD notified.  [facility] stated she can be contacted or to call the community number [phone] and to ask for [facility]. Shara is still pending through SNF.  TOC will continue to follow.                      Expected Discharge Plan and Services                                               Social Drivers of Health (SDOH) Interventions SDOH Screenings   Food Insecurity: No Food Insecurity (01/13/2024)  Housing: Low Risk (01/13/2024)  Transportation Needs: No Transportation Needs (01/13/2024)  Utilities: Not At Risk (01/13/2024)  Financial Resource Strain: Patient Declined (08/13/2023)   Received from [facility]  Physical Activity: Inactive (08/13/2023)   Received from [facility]  Social Connections: Socially Integrated (01/13/2024)  Stress: No Stress Concern Present (08/13/2023)   Received from [facility]  Tobacco Use: Low Risk (01/10/2024)    Readmission Risk Interventions    01/13/2024    1:16 PM  Readmission Risk Prevention Plan  Transportation Screening Complete  PCP or Specialist Appt within 5-7 Days Complete  Home Care Screening Complete  Medication Review (RN CM) Referral to Pharmacy

## 2024-01-20 NOTE — Plan of Care (Signed)

## 2024-01-20 NOTE — Progress Notes (Signed)
 Occupational Therapy Treatment Patient Details Name: Peter Bryan MRN: [id number] DOB: September 10, 1960 Today's Date: 01/20/2024   History of present illness 64 y.o. male admitted 01/10/24 with worsening weakness, AMS, SOB. Workup for septic shock, severe hyperkalemia, AKI on CKD IV requiring CRRT 1/6-1/8. PMH includes HTN, DM2, CKD IV, multiple back sxs, chronic pain syndrome.   OT comments  Pt making good progress towards goals and premedicated for pain (feet/ankles). Pt able to progress short distance mobility using RW with Min A x 2 and did not require a seated rest break today. Pt able to ambulate into bathroom afterwards, manage toilet transfer with BSC over toilet with Min A x 2. Due to standing balance deficits, pt continues to require up to Total A for LB ADLs including posterior hygiene after BM today. Patient will benefit from continued inpatient follow up therapy, <3 hours/day at DC.      If plan is discharge home, recommend the following:  Assistance with cooking/housework;Direct supervision/assist for medications management;Direct supervision/assist for financial management;Assist for transportation;Help with stairs or ramp for entrance;A lot of help with walking and/or transfers;A lot of help with bathing/dressing/bathroom   Equipment Recommendations  BSC/3in1;Tub/shower bench    Recommendations for Other Services      Precautions / Restrictions Precautions Precautions: Fall Restrictions Weight Bearing Restrictions Per Provider Order: No       Mobility Bed Mobility Overal bed mobility: Needs Assistance Bed Mobility: Supine to Sit     Supine to sit: Supervision, HOB elevated, Used rails          Transfers Overall transfer level: Needs assistance Equipment used: Rolling Hun (2 wheels) Transfers: Sit to/from Stand Sit to Stand: Min assist, +2 physical assistance, +2 safety/equipment, Mod assist           General transfer comment: Mod A x 2 for initial stand  from bed but progressing to Min A x 2 to stand from [facility] over toilet     Balance Overall balance assessment: Needs assistance Sitting-balance support: No upper extremity supported, Feet supported Sitting balance-Leahy Scale: Fair     Standing balance support: Bilateral upper extremity supported, Reliant on assistive device for balance, During functional activity Standing balance-Leahy Scale: Poor                             ADL either performed or assessed with clinical judgement   ADL Overall ADL's : Needs assistance/impaired                     Lower Body Dressing: Maximal assistance;Sitting/lateral leans Lower Body Dressing Details (indicate cue type and reason): Max A to don B tennis shoes sitting EOB. pt reports unable to cross LE like usual and difficulty bending to reach to tie shoes Toilet Transfer: Minimal assistance;+2 for physical assistance;+2 for safety/equipment;Ambulation;Regular Toilet;BSC/3in1;Rolling [place] (2 wheels) Toilet Transfer Details (indicate cue type and reason): BSC over toilet. Min A x 2 with chair follow to walk back into room and into bathroom. Min A x 2 to stand from [facility] over toilet Toileting- Clothing Manipulation and Hygiene: Total assistance;Sit to/from stand Toileting - Clothing Manipulation Details (indicate cue type and reason): Total A for peri care in standing after toileting     Functional mobility during ADLs: Minimal assistance;+2 for safety/equipment;Rolling Record (2 wheels) General ADL Comments: Able to progress to walking just outside room door and turning around using RW withotu need for seated rest break today. approx 78ft  Extremity/Trunk Assessment Upper Extremity Assessment Upper Extremity Assessment: Overall WFL for tasks assessed;Right hand dominant   Lower Extremity Assessment Lower Extremity Assessment: Defer to PT evaluation        Vision   Vision Assessment?: No apparent visual deficits    Perception     Praxis     Communication Communication Communication: No apparent difficulties   Cognition Arousal: Alert Behavior During Therapy: Flat affect Cognition: No apparent impairments                               Following commands: Intact        Cueing   Cueing Techniques: Verbal cues, Visual cues, Gestural cues  Exercises      Shoulder Instructions       General Comments      Pertinent Vitals/ Pain       Pain Assessment Pain Assessment: Faces Faces Pain Scale: Hurts little more Pain Location: B ankles/feet Pain Descriptors / Indicators: Discomfort, Guarding, Sharp, Shooting Pain Intervention(s): Monitored during session, Limited activity within patient's tolerance, Premedicated before session  Home Living                                          Prior Functioning/Environment              Frequency  Min 2X/week        Progress Toward Goals  OT Goals(current goals can now be found in the care plan section)  Progress towards OT goals: Progressing toward goals     Plan      Co-evaluation                 AM-PAC OT 6 Clicks Daily Activity     Outcome Measure   Help from another person eating meals?: A Little Help from another person taking care of personal grooming?: A Little Help from another person toileting, which includes using toliet, bedpan, or urinal?: Total Help from another person bathing (including washing, rinsing, drying)?: A Lot Help from another person to put on and taking off regular upper body clothing?: A Little Help from another person to put on and taking off regular lower body clothing?: A Lot 6 Click Score: 14    End of Session Equipment Utilized During Treatment: Gait belt;Rolling [place] (2 wheels)  OT Visit Diagnosis: Unsteadiness on feet (R26.81);Other abnormalities of gait and mobility (R26.89);Muscle weakness (generalized) (M62.81);Other symptoms and signs involving  cognitive function   Activity Tolerance Patient tolerated treatment well   Patient Left in chair;with call bell/phone within reach;with chair alarm set   Nurse Communication Mobility status        Time: 1100-1135 OT Time Calculation (min): 35 min  Charges: OT General Charges $OT Visit: 1 Visit OT Treatments $Self Care/Home Management : 8-22 mins $Therapeutic Activity: 8-22 mins  Mliss NOVAK, OTR/L Acute Rehab Services Office: [phone]   Mliss Fish 01/20/2024, 12:24 PM

## 2024-01-21 DIAGNOSIS — R579 Shock, unspecified: Secondary | ICD-10-CM | POA: Diagnosis not present

## 2024-01-21 DIAGNOSIS — I10 Essential (primary) hypertension: Secondary | ICD-10-CM | POA: Diagnosis not present

## 2024-01-21 DIAGNOSIS — N184 Chronic kidney disease, stage 4 (severe): Secondary | ICD-10-CM | POA: Diagnosis not present

## 2024-01-21 DIAGNOSIS — N179 Acute kidney failure, unspecified: Secondary | ICD-10-CM | POA: Diagnosis not present

## 2024-01-21 LAB — BASIC METABOLIC PANEL WITH GFR
Anion gap: 10 (ref 5–15)
BUN: 85 mg/dL — ABNORMAL HIGH (ref 8–23)
CO2: 23 mmol/L (ref 22–32)
Calcium: 9.1 mg/dL (ref 8.9–10.3)
Chloride: 101 mmol/L (ref 98–111)
Creatinine, Ser: 2.78 mg/dL — ABNORMAL HIGH (ref 0.61–1.24)
GFR, Estimated: 24 mL/min — ABNORMAL LOW
Glucose, Bld: 100 mg/dL — ABNORMAL HIGH (ref 70–99)
Potassium: 5.7 mmol/L — ABNORMAL HIGH (ref 3.5–5.1)
Sodium: 134 mmol/L — ABNORMAL LOW (ref 135–145)

## 2024-01-21 MED ORDER — SODIUM ZIRCONIUM CYCLOSILICATE 10 G PO PACK
10.0000 g | PACK | Freq: Once | ORAL | Status: AC
  Administered 2024-01-21: 10 g via ORAL
  Filled 2024-01-21: qty 1

## 2024-01-21 NOTE — Progress Notes (Signed)
" °   01/21/24 2033  BiPAP/CPAP/SIPAP  BiPAP/CPAP/SIPAP Pt Type Adult  BiPAP/CPAP/SIPAP Resmed  Mask Type  (pt has home CPAP machine)  Patient Home Machine Yes  Safety Check Completed by RT for Home Unit Yes, no issues noted  Patient Home Mask Yes  Patient Home Tubing Yes    "

## 2024-01-21 NOTE — Plan of Care (Signed)

## 2024-01-21 NOTE — Progress Notes (Signed)
 "          [facility]                                                                              Peter Bryan, is a 64 y.o. male, DOB - 06-Sep-1960, FMW:[id number] Admit date - 01/10/2024    Outpatient Primary MD for the patient is Turbyfill, Silvano I, NP  LOS - 11  days  Chief Complaint  Patient presents with   Fatigue       Brief summary   Patient is a 64 year old male with HTN, CKD stage IV, recurrent hyperkalemia, DM2, OSA, morbid obesity, multiple back surgeries, chronic pain syndrome presented to ED with altered mental status.  At baseline ambulates with a Linsey/cane.  In ED was found to be profoundly hypotensive, hyperkalemia and severe metabolic acidosis initially required pressors and CRRT.  Pneumonia was presumed to be etiology for his septic shock.  Off of pressors as of 1/8 and was transferred to the floor on 1/9.  Awaiting SNF.  Assessment & Plan     Septic shock in the setting of left lower lobe pneumonia, resolved -; hypothermic with leukocytosis and source LLL PNA noted - CT chest abdomen pelvis showed opacity in the left lower lobe possibly representing early pneumonia. - Off pressors as of 1/8 - Completed doxycycline , ceftriaxone  on 1/11 - No acute issues, on room air    Acute metabolic encephalopathy, resolved - Multifactorial in the setting of shock, infection, uremia - Resolved, currently alert and oriented, at baseline   AKI on CKD4, resolved Severe metabolic acidosis High anion gap metabolic acidosis, resolved Severe hyperkalemia-resolved - Presumably secondary to shock as above.  Nephrology consulted. - Renal function improving, CRRT discontinued 1/8 -Temporary HD catheter removed. - Creatinine continues to improve, 2.7  Mild hyperkalemia -K improving -Hold ARB/irbesartan  -K5.7, Lokelma  10 g x 1   Ambulatory dysfunction, acute  - PT OT evaluation recommended SNF   HTN  - Currently on torsemide    Gout exacerbation ruled out  -  Uric acid levels within normal limits   DM 2, non-insulin -dependent, well controlled  -HbA1c 5.4 well-controlled   BPH No acute issues    Mild malnutrition  Nutrition Problem: Increased nutrient needs Etiology: acute illness  Signs/Symptoms: estimated needs  Interventions: MVI, Ensure Enlive (each supplement provides 350kcal and 20 grams of protein), Refer to RD note for recommendations, Liberalize Diet   Obesity class III Estimated body mass index is 41.05 kg/m as calculated from the following:   Height as of this encounter: 5' 11 (1.803 m).   Weight as of this encounter: 133.5 kg.  Code Status: full  DVT Prophylaxis:  heparin  injection 5,000 Units Start: 01/11/24 0600 SCDs Start: 01/10/24 2209   Level of Care: Level of care: Telemetry Family Communication: Updated patient's daughter in law at the bed side on 1/14 Disposition Plan:      Remains inpatient appropriate:   Awaiting SNF, no acute medical issues   Procedures:  HD  Consultants:   PCCM Nephrology  Antimicrobials:   Anti-infectives (From admission, onward)    Start     Dose/Rate Route Frequency Ordered Stop   01/11/24 1500  doxycycline  (VIBRAMYCIN ) 100  mg in sodium chloride  0.9 % 250 mL IVPB        100 mg 125 mL/hr over 120 Minutes Intravenous Every 12 hours 01/11/24 1427 01/16/24 0050   01/11/24 1400  cefTRIAXone  (ROCEPHIN ) 2 g in sodium chloride  0.9 % 100 mL IVPB        2 g 200 mL/hr over 30 Minutes Intravenous Every 24 hours 01/11/24 0952 01/15/24 1611   01/11/24 0800  vancomycin  (VANCOREADY) IVPB 1500 mg/300 mL  Status:  Discontinued        1,500 mg 150 mL/hr over 120 Minutes Intravenous Daily 01/10/24 2234 01/11/24 0950   01/11/24 0200  piperacillin -tazobactam (ZOSYN ) IVPB 3.375 g  Status:  Discontinued        3.375 g 100 mL/hr over 30 Minutes Intravenous Every 6 hours 01/10/24 2234 01/11/24 0952   01/10/24 1800  ceFEPIme  (MAXIPIME ) 2 g in sodium chloride  0.9 % 100 mL IVPB        2 g 200  mL/hr over 30 Minutes Intravenous  Once 01/10/24 1753 01/10/24 1847   01/10/24 1800  metroNIDAZOLE  (FLAGYL ) IVPB 500 mg        500 mg 100 mL/hr over 60 Minutes Intravenous  Once 01/10/24 1753 01/10/24 1919   01/10/24 1800  vancomycin  (VANCOCIN ) IVPB 1000 mg/200 mL premix        1,000 mg 200 mL/hr over 60 Minutes Intravenous  Once 01/10/24 1753 01/10/24 1952          Medications  baclofen   5 mg Oral QHS   gabapentin   100 mg Oral QID   heparin   5,000 Units Subcutaneous Q8H   linaclotide   290 mcg Oral QAC breakfast   multivitamin  1 tablet Oral QHS   polyethylene glycol  17 g Oral Daily   senna  1 tablet Oral BID   sodium chloride  flush  10-40 mL Intracatheter Q12H   tamsulosin   0.4 mg Oral Daily   torsemide   20 mg Oral Daily      Subjective:   Peter Bryan was seen and examined today.  No acute issues, requesting for bath.  No acute complaints, awaiting SNF.   Objective:   Vitals:   01/20/24 1512 01/20/24 2026 01/21/24 0529 01/21/24 0842  BP: 135/60 (!) 122/47 (!) 116/57 124/61  Pulse: 63 63 (!) 58 (!) 58  Resp:  16 18 18   Temp: 98.2 F (36.8 C) 98 F (36.7 C) 98.4 F (36.9 C) 98 F (36.7 C)  TempSrc: Oral Oral  Oral  SpO2: 97% 100% 96% 98%  Weight:      Height:        Intake/Output Summary (Last 24 hours) at 01/21/2024 1150 Last data filed at 01/21/2024 9386 Gross per 24 hour  Intake 240 ml  Output 3550 ml  Net -3310 ml     Wt Readings from Last 3 Encounters:  01/18/24 133.5 kg  09/03/22 130.1 kg  01/21/22 (!) 139.5 kg    Physical Exam General: Alert and oriented x 3, NAD Cardiovascular: S1 S2 clear, RRR.  Respiratory: CTAB, no wheezing Gastrointestinal: Soft, nontender, nondistended, NBS Ext: no pedal edema bilaterally Psych: Normal affect     Data Reviewed:  I have personally reviewed following labs    CBC Lab Results  Component Value Date   WBC 11.7 (H) 01/14/2024   RBC 2.92 (L) 01/14/2024   HGB 8.4 (L) 01/14/2024   HCT 26.8 (L)  01/14/2024   MCV 91.8 01/14/2024   MCH 28.8 01/14/2024   PLT 120 (L) 01/14/2024  MCHC 31.3 01/14/2024   RDW 13.4 01/14/2024   LYMPHSABS 1.1 01/10/2024   MONOABS 0.7 01/10/2024   EOSABS 0.0 01/10/2024   BASOSABS 0.0 01/10/2024     Last metabolic panel Lab Results  Component Value Date   NA 134 (L) 01/21/2024   K 5.7 (H) 01/21/2024   CL 101 01/21/2024   CO2 23 01/21/2024   BUN 85 (H) 01/21/2024   CREATININE 2.78 (H) 01/21/2024   GLUCOSE 100 (H) 01/21/2024   GFRNONAA 24 (L) 01/21/2024   GFRAA 52 (L) 03/07/2019   CALCIUM  9.1 01/21/2024   PHOS 2.2 (L) 01/14/2024   PROT 6.2 (L) 01/10/2024   ALBUMIN 3.5 01/14/2024   BILITOT 0.3 01/10/2024   ALKPHOS 72 01/10/2024   AST 11 (L) 01/10/2024   ALT 18 01/10/2024   ANIONGAP 10 01/21/2024    CBG (last 3)  No results for input(s): GLUCAP in the last 72 hours.    Coagulation Profile: No results for input(s): INR, PROTIME in the last 168 hours.   Radiology Studies: I have personally reviewed the imaging studies  No results found.     Nydia Distance M.D. [facility] 01/21/2024, 11:50 AM  Available via Epic secure chat 7am-7pm After 7 pm, please refer to night coverage provider listed on amion.    "

## 2024-01-22 DIAGNOSIS — N184 Chronic kidney disease, stage 4 (severe): Secondary | ICD-10-CM | POA: Diagnosis not present

## 2024-01-22 DIAGNOSIS — I10 Essential (primary) hypertension: Secondary | ICD-10-CM | POA: Diagnosis not present

## 2024-01-22 DIAGNOSIS — N179 Acute kidney failure, unspecified: Secondary | ICD-10-CM | POA: Diagnosis not present

## 2024-01-22 DIAGNOSIS — R579 Shock, unspecified: Secondary | ICD-10-CM | POA: Diagnosis not present

## 2024-01-22 LAB — BASIC METABOLIC PANEL WITH GFR
Anion gap: 12 (ref 5–15)
BUN: 86 mg/dL — ABNORMAL HIGH (ref 8–23)
CO2: 22 mmol/L (ref 22–32)
Calcium: 9.2 mg/dL (ref 8.9–10.3)
Chloride: 102 mmol/L (ref 98–111)
Creatinine, Ser: 2.72 mg/dL — ABNORMAL HIGH (ref 0.61–1.24)
GFR, Estimated: 25 mL/min — ABNORMAL LOW
Glucose, Bld: 101 mg/dL — ABNORMAL HIGH (ref 70–99)
Potassium: 5.6 mmol/L — ABNORMAL HIGH (ref 3.5–5.1)
Sodium: 136 mmol/L (ref 135–145)

## 2024-01-22 MED ORDER — SODIUM ZIRCONIUM CYCLOSILICATE 10 G PO PACK
10.0000 g | PACK | Freq: Two times a day (BID) | ORAL | Status: AC
  Administered 2024-01-22 – 2024-01-23 (×3): 10 g via ORAL
  Filled 2024-01-22 (×3): qty 1

## 2024-01-22 NOTE — Plan of Care (Signed)

## 2024-01-22 NOTE — Progress Notes (Signed)
 "          [facility]                                                                              Noeh Bryan, is a 64 y.o. male, DOB - 02/08/1960, FMW:[id number] Admit date - 01/10/2024    Outpatient Primary MD for the patient is Turbyfill, Silvano I, NP  LOS - 12  days  Chief Complaint  Patient presents with   Fatigue       Brief summary   Patient is a 64 year old male with HTN, CKD stage IV, recurrent hyperkalemia, DM2, OSA, morbid obesity, multiple back surgeries, chronic pain syndrome presented to ED with altered mental status.  At baseline ambulates with a [place]/cane.  In ED was found to be profoundly hypotensive, hyperkalemia and severe metabolic acidosis initially required pressors and CRRT.  Pneumonia was presumed to be etiology for his septic shock.  Off of pressors as of 1/8 and was transferred to the floor on 1/9.  Awaiting SNF.  Assessment & Plan     Septic shock in the setting of left lower lobe pneumonia, resolved -; hypothermic with leukocytosis and source LLL PNA noted - CT chest abdomen pelvis showed opacity in the left lower lobe possibly representing early pneumonia. - Off pressors as of 1/8 - Completed doxycycline , ceftriaxone  on 1/11 - No acute issues    Acute metabolic encephalopathy, resolved - Multifactorial in the setting of shock, infection, uremia - Resolved, currently alert and oriented, at baseline   AKI on CKD4, resolved Severe metabolic acidosis High anion gap metabolic acidosis, resolved Severe hyperkalemia-resolved - Presumably secondary to shock as above.  Nephrology consulted. - Renal function improving, CRRT discontinued 1/8 -Temporary HD catheter removed. - Creatinine continues to improve, 2.7  Mild hyperkalemia - Continue to hold ARB -K5.6, placed on Lokelma  x 3 doses  Ambulatory dysfunction, acute  - PT OT evaluation recommended SNF   HTN  - Currently on torsemide    Gout exacerbation ruled out  - Uric acid levels  within normal limits   DM 2, non-insulin -dependent, well controlled  -HbA1c 5.4 well-controlled   BPH No acute issues    Mild malnutrition  Nutrition Problem: Increased nutrient needs Etiology: acute illness  Signs/Symptoms: estimated needs  Interventions: MVI, Ensure Enlive (each supplement provides 350kcal and 20 grams of protein), Refer to RD note for recommendations, Liberalize Diet   Obesity class III Estimated body mass index is 41.05 kg/m as calculated from the following:   Height as of this encounter: 5' 11 (1.803 m).   Weight as of this encounter: 133.5 kg.  Code Status: full  DVT Prophylaxis:  heparin  injection 5,000 Units Start: 01/11/24 0600 SCDs Start: 01/10/24 2209   Level of Care: Level of care: Med-Surg Family Communication: Updated patient's daughter in law at the bed side on 1/14 Disposition Plan:      Remains inpatient appropriate:   Awaiting SNF, no acute medical issues   Procedures:  HD  Consultants:   PCCM Nephrology  Antimicrobials:   Anti-infectives (From admission, onward)    Start     Dose/Rate Route Frequency Ordered Stop   01/11/24 1500  doxycycline  (VIBRAMYCIN ) 100 mg in  sodium chloride  0.9 % 250 mL IVPB        100 mg 125 mL/hr over 120 Minutes Intravenous Every 12 hours 01/11/24 1427 01/16/24 0050   01/11/24 1400  cefTRIAXone  (ROCEPHIN ) 2 g in sodium chloride  0.9 % 100 mL IVPB        2 g 200 mL/hr over 30 Minutes Intravenous Every 24 hours 01/11/24 0952 01/15/24 1611   01/11/24 0800  vancomycin  (VANCOREADY) IVPB 1500 mg/300 mL  Status:  Discontinued        1,500 mg 150 mL/hr over 120 Minutes Intravenous Daily 01/10/24 2234 01/11/24 0950   01/11/24 0200  piperacillin -tazobactam (ZOSYN ) IVPB 3.375 g  Status:  Discontinued        3.375 g 100 mL/hr over 30 Minutes Intravenous Every 6 hours 01/10/24 2234 01/11/24 0952   01/10/24 1800  ceFEPIme  (MAXIPIME ) 2 g in sodium chloride  0.9 % 100 mL IVPB        2 g 200 mL/hr over 30  Minutes Intravenous  Once 01/10/24 1753 01/10/24 1847   01/10/24 1800  metroNIDAZOLE  (FLAGYL ) IVPB 500 mg        500 mg 100 mL/hr over 60 Minutes Intravenous  Once 01/10/24 1753 01/10/24 1919   01/10/24 1800  vancomycin  (VANCOCIN ) IVPB 1000 mg/200 mL premix        1,000 mg 200 mL/hr over 60 Minutes Intravenous  Once 01/10/24 1753 01/10/24 1952          Medications  baclofen   5 mg Oral QHS   gabapentin   100 mg Oral QID   heparin   5,000 Units Subcutaneous Q8H   linaclotide   290 mcg Oral QAC breakfast   multivitamin  1 tablet Oral QHS   polyethylene glycol  17 g Oral Daily   senna  1 tablet Oral BID   sodium chloride  flush  10-40 mL Intracatheter Q12H   sodium zirconium cyclosilicate   10 g Oral BID   tamsulosin   0.4 mg Oral Daily   torsemide   20 mg Oral Daily      Subjective:   Peter Bryan was seen and examined today.  Awaiting SNF, no acute issues.   Objective:   Vitals:   01/21/24 1633 01/21/24 1932 01/22/24 0558 01/22/24 0800  BP: (!) 141/78 (!) 140/79 115/63 117/78  Pulse: 62 66 61 65  Resp: 18 18 17 17   Temp: 98.5 F (36.9 C) 99.2 F (37.3 C) 99.7 F (37.6 C) 98.5 F (36.9 C)  TempSrc: Oral Oral Oral Oral  SpO2: 98% 94% 96% 100%  Weight:      Height:        Intake/Output Summary (Last 24 hours) at 01/22/2024 1206 Last data filed at 01/22/2024 0601 Gross per 24 hour  Intake --  Output 2600 ml  Net -2600 ml     Wt Readings from Last 3 Encounters:  01/18/24 133.5 kg  09/03/22 130.1 kg  01/21/22 (!) 139.5 kg   Physical Exam General: Alert and oriented x 3, NAD Cardiovascular: S1 S2 clear, RRR.  Respiratory: CTAB, no wheezing Gastrointestinal: Soft, nontender, nondistended, NBS Ext: no pedal edema bilaterally Psych: Normal affect      Data Reviewed:  I have personally reviewed following labs    CBC Lab Results  Component Value Date   WBC 11.7 (H) 01/14/2024   RBC 2.92 (L) 01/14/2024   HGB 8.4 (L) 01/14/2024   HCT 26.8 (L) 01/14/2024    MCV 91.8 01/14/2024   MCH 28.8 01/14/2024   PLT 120 (L) 01/14/2024  MCHC 31.3 01/14/2024   RDW 13.4 01/14/2024   LYMPHSABS 1.1 01/10/2024   MONOABS 0.7 01/10/2024   EOSABS 0.0 01/10/2024   BASOSABS 0.0 01/10/2024     Last metabolic panel Lab Results  Component Value Date   NA 136 01/22/2024   K 5.6 (H) 01/22/2024   CL 102 01/22/2024   CO2 22 01/22/2024   BUN 86 (H) 01/22/2024   CREATININE 2.72 (H) 01/22/2024   GLUCOSE 101 (H) 01/22/2024   GFRNONAA 25 (L) 01/22/2024   GFRAA 52 (L) 03/07/2019   CALCIUM  9.2 01/22/2024   PHOS 2.2 (L) 01/14/2024   PROT 6.2 (L) 01/10/2024   ALBUMIN 3.5 01/14/2024   BILITOT 0.3 01/10/2024   ALKPHOS 72 01/10/2024   AST 11 (L) 01/10/2024   ALT 18 01/10/2024   ANIONGAP 12 01/22/2024    CBG (last 3)  No results for input(s): GLUCAP in the last 72 hours.    Coagulation Profile: No results for input(s): INR, PROTIME in the last 168 hours.   Radiology Studies: I have personally reviewed the imaging studies  No results found.     Nydia Distance M.D. [facility] 01/22/2024, 12:06 PM  Available via Epic secure chat 7am-7pm After 7 pm, please refer to night coverage provider listed on amion.    "

## 2024-01-22 NOTE — Progress Notes (Signed)
 Patient walked 50 ft with [place]

## 2024-01-23 DIAGNOSIS — I10 Essential (primary) hypertension: Secondary | ICD-10-CM | POA: Diagnosis not present

## 2024-01-23 DIAGNOSIS — R579 Shock, unspecified: Secondary | ICD-10-CM | POA: Diagnosis not present

## 2024-01-23 DIAGNOSIS — N184 Chronic kidney disease, stage 4 (severe): Secondary | ICD-10-CM | POA: Diagnosis not present

## 2024-01-23 DIAGNOSIS — N179 Acute kidney failure, unspecified: Secondary | ICD-10-CM | POA: Diagnosis not present

## 2024-01-23 LAB — BASIC METABOLIC PANEL WITH GFR
Anion gap: 12 (ref 5–15)
BUN: 90 mg/dL — ABNORMAL HIGH (ref 8–23)
CO2: 22 mmol/L (ref 22–32)
Calcium: 9.3 mg/dL (ref 8.9–10.3)
Chloride: 100 mmol/L (ref 98–111)
Creatinine, Ser: 2.92 mg/dL — ABNORMAL HIGH (ref 0.61–1.24)
GFR, Estimated: 23 mL/min — ABNORMAL LOW
Glucose, Bld: 100 mg/dL — ABNORMAL HIGH (ref 70–99)
Potassium: 6.1 mmol/L — ABNORMAL HIGH (ref 3.5–5.1)
Sodium: 134 mmol/L — ABNORMAL LOW (ref 135–145)

## 2024-01-23 LAB — CREATININE, URINE, RANDOM: Creatinine, Urine: 62 mg/dL

## 2024-01-23 LAB — NA AND K (SODIUM & POTASSIUM), RAND UR
Potassium Urine: 25 mmol/L
Sodium, Ur: 87 mmol/L

## 2024-01-23 MED ORDER — SODIUM ZIRCONIUM CYCLOSILICATE 10 G PO PACK
10.0000 g | PACK | Freq: Three times a day (TID) | ORAL | Status: AC
  Administered 2024-01-23 (×3): 10 g via ORAL
  Filled 2024-01-23 (×3): qty 1

## 2024-01-23 MED ORDER — SODIUM CHLORIDE 0.9 % IV SOLN: INTRAVENOUS | Status: AC

## 2024-01-23 MED ORDER — LACTULOSE 10 GM/15ML PO SOLN
20.0000 g | Freq: Once | ORAL | Status: AC
  Administered 2024-01-23: 20 g via ORAL
  Filled 2024-01-23: qty 30

## 2024-01-23 NOTE — Plan of Care (Signed)
   Problem: Clinical Measurements: Goal: Ability to maintain clinical measurements within normal limits will improve Outcome: Progressing   Problem: Activity: Goal: Risk for activity intolerance will decrease Outcome: Progressing   Problem: Nutrition: Goal: Adequate nutrition will be maintained Outcome: Progressing

## 2024-01-23 NOTE — Consult Note (Signed)
 [facility] Renal Consultation Note  Requesting MD: Rai Indication for Consultation:  AKI on CKD4  Chief complaint: AMS   HPI:  Peter Bryan is a 64 y.o. male PMHx HTN, CKD stage IV, hyperkalemia, T2DM, OSA, chronic pain presented initially with AMS. Found to be hypotensive, hyperkalemic, severe metabolic acidosis in septic shock 2/2 pneumonia requiring pressors and CRRT. Nephrology signed off 1/13. Reconsulted 1/19 for worsening creatinine, hyperkalemia.   Creatinine, Ser  Date/Time Value Ref Range Status  01/23/2024 12:13 AM 2.92 (H) 0.61 - 1.24 mg/dL Final  98/81/7973 96:43 AM 2.72 (H) 0.61 - 1.24 mg/dL Final  98/82/7973 87:51 AM 2.78 (H) 0.61 - 1.24 mg/dL Final  98/83/7973 96:63 AM 2.86 (H) 0.61 - 1.24 mg/dL Final  98/85/7973 88:62 PM 2.93 (H) 0.61 - 1.24 mg/dL Final  98/86/7973 87:82 AM 3.09 (H) 0.61 - 1.24 mg/dL Final  98/89/7973 89:69 AM 3.02 (H) 0.61 - 1.24 mg/dL Final  98/90/7973 97:81 PM 3.05 (H) 0.61 - 1.24 mg/dL Final  98/91/7973 96:75 AM 3.18 (H) 0.61 - 1.24 mg/dL Final  98/92/7973 96:94 PM 4.43 (H) 0.61 - 1.24 mg/dL Final  98/92/7973 94:58 AM 5.80 (H) 0.61 - 1.24 mg/dL Final  98/92/7973 94:58 AM 5.82 (H) 0.61 - 1.24 mg/dL Final  98/93/7973 88:85 PM 6.84 (H) 0.61 - 1.24 mg/dL Final  98/93/7973 94:41 PM 6.97 (H) 0.61 - 1.24 mg/dL Final  90/69/7976 97:83 PM 3.98 (H) 0.61 - 1.24 mg/dL Final  95/81/7976 89:82 PM 2.84 (H) 0.61 - 1.24 mg/dL Final  87/85/7977 87:54 PM 2.47 (H) 0.61 - 1.24 mg/dL Final  96/96/7978 98:80 PM 1.63 (H) 0.61 - 1.24 mg/dL Final     PMHx:   Past Medical History:  Diagnosis Date   Chronic kidney disease    Diabetes mellitus without complication (HCC)    Hypertension    Neuromuscular disorder (HCC)    neuropathy feet   Obesity    Sleep apnea     Past Surgical History:  Procedure Laterality Date   BACK SURGERY  1/92-08/2016   multiple levels. C5-S1   CARPAL TUNNEL RELEASE     CERVICAL SPINE SURGERY     ROTATOR CUFF REPAIR      SHOULDER ARTHROSCOPY WITH OPEN ROTATOR CUFF REPAIR AND DISTAL CLAVICLE ACROMINECTOMY Right 03/16/2019   Procedure: SHOULDER ARTHROSCOPY WITH OPEN ROTATOR CUFF REPAIR AND DISTAL CLAVICLE ACROMINECTOMY;  Surgeon: Shari Sieving, MD;  Location: [facility];  Service: Orthopedics;  Laterality: Right;    Family Hx: History reviewed. No pertinent family history.  Social History:  reports that he has never smoked. He has never used smokeless tobacco. He reports that he does not drink alcohol and does not use drugs.  Allergies: Allergies[1]  Medications: Prior to Admission medications  Medication Sig Start Date End Date Taking? Authorizing Provider  baclofen  (LIORESAL ) 10 MG tablet Take 10 mg by mouth at bedtime.   Yes [provider]  Cholecalciferol (VITAMIN D ) 50 MCG (2000 UT) tablet Take 1 tablet (2,000 Units total) by mouth daily. 01/20/24  Yes Rai, Ripudeep K, MD  gabapentin  (NEURONTIN ) 300 MG capsule Take 300 mg by mouth 4 (four) times daily. 07/22/08  Yes [provider]  hydrALAZINE (APRESOLINE) 25 MG tablet Take 25 mg by mouth 2 (two) times daily. 09/04/21  Yes [provider]  HYDROcodone -acetaminophen  (NORCO) 10-325 MG per tablet Take 2-2.5 tablets by mouth See admin instructions. Take 2.5 mg in the morning and at lunch as 2 tables in the evening   Yes [provider]  JARDIANCE  10 MG TABS tablet TAKE 1 TABLET DAILY BEFORE BREAKFAST 07/29/23  Yes Lonni Slain, MD  LINZESS  290 MCG CAPS capsule Take 290 mcg by mouth daily.   Yes [provider]  MAGNESIUM PO Take 1 capsule by mouth at bedtime.   Yes [provider]  rosuvastatin (CRESTOR) 5 MG tablet Take 5 mg by mouth daily.     Yes [provider]  testosterone  cypionate (DEPOTESTOSTERONE CYPIONATE) 200 MG/ML injection Inject 200 mg into the muscle once a week. 02/24/19  Yes [provider]  thiamine  50 MG tablet Take 50 mg by mouth 2 (two) times daily.   Yes  [provider]  Turmeric (QC TUMERIC COMPLEX PO) Take 4,500 mg by mouth daily at 12 noon.   Yes [provider]  valsartan-hydrochlorothiazide  (DIOVAN-HCT) 320-25 MG tablet Take 1 tablet by mouth at bedtime. Midnight   Yes [provider]  Vitamin D , Ergocalciferol , (DRISDOL) 1.25 MG (50000 UNIT) CAPS capsule Take 50,000 Units by mouth 2 (two) times a week.   Yes [provider]  acetaminophen  (TYLENOL ) 325 MG tablet Take 2 tablets (650 mg total) by mouth every 6 (six) hours as needed for fever or mild pain (pain score 1-3). 01/20/24   Rai, Nydia POUR, MD  gabapentin  (NEURONTIN ) 100 MG capsule Take 1 capsule (100 mg total) by mouth 4 (four) times daily. 01/20/24   Rai, Nydia POUR, MD  HYDROcodone -acetaminophen  (NORCO/VICODIN) 5-325 MG tablet Take 1 tablet by mouth every 6 (six) hours as needed for severe pain (pain score 7-10). 01/20/24   Rai, Ripudeep K, MD  multivitamin (RENA-VIT) TABS tablet Take 1 tablet by mouth at bedtime. 01/20/24   Rai, Nydia POUR, MD  polyethylene glycol (MIRALAX  / GLYCOLAX ) 17 g packet Take 17 g by mouth daily. 01/21/24   Rai, Nydia POUR, MD  senna (SENOKOT) 8.6 MG TABS tablet Take 1 tablet (8.6 mg total) by mouth 2 (two) times daily. 01/20/24   Rai, Nydia POUR, MD  sodium bicarbonate  650 MG tablet Take 650 mg by mouth daily. Patient not taking: Reported on 01/11/2024 09/26/20   [provider]  tamsulosin  (FLOMAX ) 0.4 MG CAPS capsule Take 1 capsule (0.4 mg total) by mouth daily. 01/20/24   Rai, Nydia POUR, MD  torsemide  (DEMADEX ) 20 MG tablet Take 1 tablet (20 mg total) by mouth daily. 01/21/24   Rai, Nydia POUR, MD  zolpidem  (AMBIEN ) 10 MG tablet Take 1 tablet (10 mg total) by mouth at bedtime as needed for sleep. 01/20/24   Rai, Nydia POUR, MD    Scheduled:  baclofen   5 mg Oral QHS   gabapentin   100 mg Oral QID   heparin   5,000 Units Subcutaneous Q8H   linaclotide   290 mcg Oral QAC breakfast   multivitamin  1 tablet Oral QHS    polyethylene glycol  17 g Oral Daily   senna  1 tablet Oral BID   sodium chloride  flush  10-40 mL Intracatheter Q12H   tamsulosin   0.4 mg Oral Daily   torsemide   20 mg Oral Daily    Labs:     Latest Ref Rng & Units 01/23/2024   12:13 AM 01/22/2024    3:56 AM 01/21/2024   12:48 AM  BMP  Glucose 70 - 99 mg/dL 899  898  899   BUN 8 - 23 mg/dL 90  86  85   Creatinine 0.61 - 1.24 mg/dL 7.07  7.27  7.21   Sodium 135 - 145 mmol/L 134  136  134   Potassium 3.5 - 5.1 mmol/L 6.1  5.6  5.7   Chloride 98 - 111 mmol/L 100  102  101   CO2 22 - 32 mmol/L 22  22  23    Calcium  8.9 - 10.3 mg/dL 9.3  9.2  9.1     Urinalysis    Component Value Date/Time   COLORURINE YELLOW 01/10/2024 2358   APPEARANCEUR CLOUDY (A) 01/10/2024 2358   LABSPEC 1.013 01/10/2024 2358   PHURINE 5.0 01/10/2024 2358   GLUCOSEU NEGATIVE 01/10/2024 2358   HGBUR LARGE (A) 01/10/2024 2358   BILIRUBINUR NEGATIVE 01/10/2024 2358   KETONESUR NEGATIVE 01/10/2024 2358   PROTEINUR 30 (A) 01/10/2024 2358   NITRITE NEGATIVE 01/10/2024 2358   LEUKOCYTESUR LARGE (A) 01/10/2024 2358     ROS:  Pertinent items noted in HPI and remainder of comprehensive ROS otherwise negative.  Physical Exam: Vitals:   01/23/24 0507 01/23/24 0811  BP: (!) 122/59 (!) 117/96  Pulse: 60 69  Resp: 17 18  Temp: 98.7 F (37.1 C) 98.3 F (36.8 C)  SpO2: 98% 99%     General: well appearing, NAD  HEENT: moist mucous membranes  Heart: RRR, no mrg Lungs: breathing comfortably on RA, CTAB Abdomen: soft, non-distended Extremities: no swelling BLE. Compression stockings in place BLE Neuro: a&ox4  Assessment/Plan: #AKI on CKD4 CRRT d/c 1/8. Cr 2.92 this morning, noted 2.93 on 1/14. Unclear baseline prior to admission but appears around 2.8 in 2023.  - Monitor I/O - Avoid nephrotoxic agents - Renal diet  #Hyperkalemia K 6.1 this morning, increased from 5.6. He is noted to have chronic hyperkalemia, states he has taken Lokelma  at home before.  Received Lokelma  10g this morning, 2 doses yesterday.  - 0.9% NaCl 100ml/h x10h to promote diuresis, K dumping - Lokelma  10g TID x3 doses - Will continue prophylactic heparin , but could be contributing due to its effect in lowering aldosterone concentrations - Urine creatinine, Na, K to assess renal potassium excretion  - am RFP  Resolved: septic shock, acute metabolic encephalopathy, severe metabolic acidosis Per primary: HTN, T2DM, BPH   Reeves Musick 01/23/2024, 10:35 AM      [1]  Allergies Allergen Reactions   Nsaids Other (See Comments)    Contraindication due to CKD 4

## 2024-01-23 NOTE — TOC Progression Note (Signed)
 Transition of Care [facility]) - Progression Note    Patient Details  Name: Peter Bryan MRN: [id number] Date of Birth: November 06, 1960  [facility] CM/SW Contact  Lendia Dais, [place] Phone Number: 01/23/2024, 2:29 PM  Clinical Narrative:  CSW spoke to pt's wife at bedside and informed her that shara was still pending from [facility]. Mrs. Disney stated she attempted to tour [facility] but couldn't and inquired about other bed offers. CSW stated that all but two facilities had declined due to the pt's insurance being out of network.   CSW informed Mrs. Helget that if they wanted to transfer facilities they can go through the SNF's social worker to complete that process.  Pt is not medically stable, pt is on IV fluids and labs will be reviewed in the AM.  Auth is approved for for SNF at [facility]. Auth number is [id number] and valid from 01/23/2024-01/28/2024.  CSW will continue to follow.                      Expected Discharge Plan and Services                                               Social Drivers of Health (SDOH) Interventions SDOH Screenings   Food Insecurity: No Food Insecurity (01/13/2024)  Housing: Low Risk (01/13/2024)  Transportation Needs: No Transportation Needs (01/13/2024)  Utilities: Not At Risk (01/13/2024)  Financial Resource Strain: Patient Declined (08/13/2023)   Received from [facility]  Physical Activity: Inactive (08/13/2023)   Received from [facility]  Social Connections: Socially Integrated (01/13/2024)  Stress: No Stress Concern Present (08/13/2023)   Received from [facility]  Tobacco Use: Low Risk (01/10/2024)    Readmission Risk Interventions    01/13/2024    1:16 PM  Readmission Risk Prevention Plan  Transportation Screening Complete  PCP or Specialist Appt within 5-7 Days Complete  Home Care Screening Complete  Medication Review (RN CM) Referral to Pharmacy

## 2024-01-23 NOTE — Progress Notes (Signed)
 Pt independent w/ CPAP unit.    01/23/24 2037  BiPAP/CPAP/SIPAP  BiPAP/CPAP/SIPAP Pt Type Adult  BiPAP/CPAP/SIPAP Resmed  Mask Type Nasal mask  Dentures removed? Not applicable  IPAP 12 cmH20  EPAP 10 cmH2O  FiO2 (%) 21 %  Patient Home Machine Yes  Safety Check Completed by RT for Home Unit Yes, no issues noted  Patient Home Mask Yes  Patient Home Tubing Yes  Auto Titrate No  CPAP/SIPAP surface wiped down Yes  Device Plugged into [employer] Power Outlet Yes

## 2024-01-23 NOTE — Progress Notes (Signed)
 "          [facility]                                                                              Peter Bryan, is a 64 y.o. male, DOB - Jan 10, 1960, FMW:[id number] Admit date - 01/10/2024    Outpatient Primary MD for the patient is Turbyfill, Silvano I, NP  LOS - 13  days  Chief Complaint  Patient presents with   Fatigue       Brief summary   Patient is a 64 year old male with HTN, CKD stage IV, recurrent hyperkalemia, DM2, OSA, morbid obesity, multiple back surgeries, chronic pain syndrome presented to ED with altered mental status.  At baseline ambulates with a Horwitz/cane.  In ED was found to be profoundly hypotensive, hyperkalemia and severe metabolic acidosis initially required pressors and CRRT.  Pneumonia was presumed to be etiology for his septic shock.  Off of pressors as of 1/8 and was transferred to the floor on 1/9.  Awaiting SNF.  Assessment & Plan     Septic shock in the setting of left lower lobe pneumonia, resolved -; hypothermic with leukocytosis and source LLL PNA noted - CT chest abdomen pelvis showed opacity in the left lower lobe possibly representing early pneumonia. - Off pressors as of 1/8 - Completed doxycycline , ceftriaxone  on 1/11 - No acute issues    Acute metabolic encephalopathy, resolved - Multifactorial in the setting of shock, infection, uremia - Resolved, currently alert and oriented, at baseline   AKI on CKD4 Severe metabolic acidosis,  High anion gap metabolic acidosis, resolved Severe hyperkalemia - Presumably secondary to shock as above.  Nephrology consulted. - Renal function improving, CRRT discontinued 1/8 -Temporary HD catheter removed. - Creatinine was improving up until starting to trend up today 2.9, persistent hyperkalemia despite Lokelma  doses - Nephrology reconsulted again for further recommendations, discussed with Dr. DOROTHA Blanch. - Hold baclofen   Persistent hyperkalemia - Continue to hold ARB - K continues to trend  up despite Lokelma  doses, nephrology reconsulted again  Ambulatory dysfunction, acute  - PT OT evaluation recommended SNF   HTN  - Currently on torsemide    Gout exacerbation ruled out  - Uric acid levels within normal limits   DM 2, non-insulin -dependent, well controlled  -HbA1c 5.4 well-controlled   BPH No acute issues   Constipation - Continue MiraLAX , Senokot S, lactulose  x 1   Mild malnutrition  Nutrition Problem: Increased nutrient needs Etiology: acute illness  Signs/Symptoms: estimated needs  Interventions: MVI, Ensure Enlive (each supplement provides 350kcal and 20 grams of protein), Refer to RD note for recommendations, Liberalize Diet   Obesity class III Estimated body mass index is 37.21 kg/m as calculated from the following:   Height as of this encounter: 5' 11 (1.803 m).   Weight as of this encounter: 121 kg.  Code Status: full  DVT Prophylaxis:  heparin  injection 5,000 Units Start: 01/11/24 0600 SCDs Start: 01/10/24 2209   Level of Care: Level of care: Med-Surg Family Communication:  Disposition Plan:      Remains inpatient appropriate:   Awaiting SNF   Procedures:  HD  Consultants:   PCCM Nephrology  Antimicrobials:  Anti-infectives (From admission, onward)    Start     Dose/Rate Route Frequency Ordered Stop   01/11/24 1500  doxycycline  (VIBRAMYCIN ) 100 mg in sodium chloride  0.9 % 250 mL IVPB        100 mg 125 mL/hr over 120 Minutes Intravenous Every 12 hours 01/11/24 1427 01/16/24 0050   01/11/24 1400  cefTRIAXone  (ROCEPHIN ) 2 g in sodium chloride  0.9 % 100 mL IVPB        2 g 200 mL/hr over 30 Minutes Intravenous Every 24 hours 01/11/24 0952 01/15/24 1611   01/11/24 0800  vancomycin  (VANCOREADY) IVPB 1500 mg/300 mL  Status:  Discontinued        1,500 mg 150 mL/hr over 120 Minutes Intravenous Daily 01/10/24 2234 01/11/24 0950   01/11/24 0200  piperacillin -tazobactam (ZOSYN ) IVPB 3.375 g  Status:  Discontinued        3.375 g 100  mL/hr over 30 Minutes Intravenous Every 6 hours 01/10/24 2234 01/11/24 0952   01/10/24 1800  ceFEPIme  (MAXIPIME ) 2 g in sodium chloride  0.9 % 100 mL IVPB        2 g 200 mL/hr over 30 Minutes Intravenous  Once 01/10/24 1753 01/10/24 1847   01/10/24 1800  metroNIDAZOLE  (FLAGYL ) IVPB 500 mg        500 mg 100 mL/hr over 60 Minutes Intravenous  Once 01/10/24 1753 01/10/24 1919   01/10/24 1800  vancomycin  (VANCOCIN ) IVPB 1000 mg/200 mL premix        1,000 mg 200 mL/hr over 60 Minutes Intravenous  Once 01/10/24 1753 01/10/24 1952          Medications  baclofen   5 mg Oral QHS   gabapentin   100 mg Oral QID   heparin   5,000 Units Subcutaneous Q8H   linaclotide   290 mcg Oral QAC breakfast   multivitamin  1 tablet Oral QHS   polyethylene glycol  17 g Oral Daily   senna  1 tablet Oral BID   sodium chloride  flush  10-40 mL Intracatheter Q12H   sodium zirconium cyclosilicate   10 g Oral TID   tamsulosin   0.4 mg Oral Daily   torsemide   20 mg Oral Daily      Subjective:   Deniro Laymon was seen and examined today.  No acute issues per patient, no chest pain, shortness of breath, fever or chills.  Creatinine, potassium continues to trend up.  Constipation   Objective:   Vitals:   01/22/24 1921 01/23/24 0500 01/23/24 0507 01/23/24 0811  BP: (!) 142/59  (!) 122/59 (!) 117/96  Pulse: 66  60 69  Resp: 17  17 18   Temp: 98.7 F (37.1 C)  98.7 F (37.1 C) 98.3 F (36.8 C)  TempSrc: Oral  Oral   SpO2: 95%  98% 99%  Weight:  121 kg    Height:        Intake/Output Summary (Last 24 hours) at 01/23/2024 1137 Last data filed at 01/23/2024 0931 Gross per 24 hour  Intake --  Output 1550 ml  Net -1550 ml     Wt Readings from Last 3 Encounters:  01/23/24 121 kg  09/03/22 130.1 kg  01/21/22 (!) 139.5 kg   Physical Exam General: Alert and oriented x 3, NAD Cardiovascular: S1 S2 clear, RRR.  Respiratory: CTAB, no wheezing Gastrointestinal: Soft, nontender, nondistended, NBS Ext: no  pedal edema bilaterally Neuro: no new deficits Psych: Normal affect     Data Reviewed:  I have personally reviewed following labs    CBC Lab  Results  Component Value Date   WBC 11.7 (H) 01/14/2024   RBC 2.92 (L) 01/14/2024   HGB 8.4 (L) 01/14/2024   HCT 26.8 (L) 01/14/2024   MCV 91.8 01/14/2024   MCH 28.8 01/14/2024   PLT 120 (L) 01/14/2024   MCHC 31.3 01/14/2024   RDW 13.4 01/14/2024   LYMPHSABS 1.1 01/10/2024   MONOABS 0.7 01/10/2024   EOSABS 0.0 01/10/2024   BASOSABS 0.0 01/10/2024     Last metabolic panel Lab Results  Component Value Date   NA 134 (L) 01/23/2024   K 6.1 (H) 01/23/2024   CL 100 01/23/2024   CO2 22 01/23/2024   BUN 90 (H) 01/23/2024   CREATININE 2.92 (H) 01/23/2024   GLUCOSE 100 (H) 01/23/2024   GFRNONAA 23 (L) 01/23/2024   GFRAA 52 (L) 03/07/2019   CALCIUM  9.3 01/23/2024   PHOS 2.2 (L) 01/14/2024   PROT 6.2 (L) 01/10/2024   ALBUMIN 3.5 01/14/2024   BILITOT 0.3 01/10/2024   ALKPHOS 72 01/10/2024   AST 11 (L) 01/10/2024   ALT 18 01/10/2024   ANIONGAP 12 01/23/2024    CBG (last 3)  No results for input(s): GLUCAP in the last 72 hours.    Coagulation Profile: No results for input(s): INR, PROTIME in the last 168 hours.   Radiology Studies: I have personally reviewed the imaging studies  No results found.     Nydia Distance M.D. [facility] 01/23/2024, 11:37 AM  Available via Epic secure chat 7am-7pm After 7 pm, please refer to night coverage provider listed on amion.    "

## 2024-01-23 NOTE — Progress Notes (Signed)
 Physical Therapy Treatment Patient Details Name: Peter Bryan MRN: [id number] DOB: [birth year] Today's Date: 01/23/2024   History of Present Illness 64 y.o. male admitted 01/10/24 with worsening weakness, AMS, SOB. Workup for septic shock, severe hyperkalemia, AKI on CKD IV requiring CRRT 1/6-1/8. PMH includes HTN, DM2, CKD IV, multiple back sxs, chronic pain syndrome.    PT Comments  Pt admitted with above diagnosis. Pt was able to ambulate incr distance today to hallway with RW and min assist. Continues to be limited by pain in bil LEs and needs incr time to ambulate as he is cautious and using UEs quite a bit to unweight LEs.  Continues to need post acute rehab < 3 hours day as pt and wife both say that pt is not at level he can be alone at home.  Pt currently with functional limitations due to the deficits listed below (see PT Problem List). Pt will benefit from acute skilled PT to increase their independence and safety with mobility to allow discharge.       If plan is discharge home, recommend the following: A lot of help with walking and/or transfers;A lot of help with bathing/dressing/bathroom;Assistance with cooking/housework;Assist for transportation;Help with stairs or ramp for entrance   Can travel by private vehicle        Equipment Recommendations   (TBD next venue, though fairly well equipped, may need wheelchair)    Recommendations for Other Services       Precautions / Restrictions Precautions Precautions: Fall Recall of Precautions/Restrictions: Impaired Restrictions Weight Bearing Restrictions Per Provider Order: No     Mobility  Bed Mobility Overal bed mobility: Needs Assistance Bed Mobility: Supine to Sit     Supine to sit: Supervision          Transfers Overall transfer level: Needs assistance Equipment used: Rolling Coller (2 wheels) Transfers: Sit to/from Stand Sit to Stand: Min assist           General transfer comment: Improving with pt  needing only min assist to stand.    Ambulation/Gait Ambulation/Gait assistance: Min assist Gait Distance (Feet): 75 Feet Assistive device: Rolling Freelove (2 wheels) Gait Pattern/deviations: Step-through pattern, Decreased stride length, Decreased step length - right, Decreased step length - left, Decreased dorsiflexion - left, Antalgic, Trunk flexed, Wide base of support   Gait velocity interpretation: <1.31 ft/sec, indicative of household ambulator   General Gait Details: Pt ambulated with RW with CGA assist progressing to  min assist at times for safety as pt fatigues and has pain in bil LEs.  Cues for sequencing steps and RW as well.  Did not need assist to steer RW. did not need to follow with chair.   Stairs             Wheelchair Mobility     Tilt Bed    Modified Rankin (Stroke Patients Only)       Balance Overall balance assessment: Needs assistance Sitting-balance support: No upper extremity supported, Feet supported Sitting balance-Leahy Scale: Good     Standing balance support: Bilateral upper extremity supported, During functional activity Standing balance-Leahy Scale: Poor Standing balance comment: relies on RW and external support                            Communication Communication Communication: No apparent difficulties  Cognition Arousal: Alert Behavior During Therapy: Flat affect   PT - Cognitive impairments: Awareness, Attention, Initiation, Sequencing, Problem solving  PT - Cognition Comments: A&Ox4 Following commands: Intact      Cueing Cueing Techniques: Verbal cues, Visual cues, Gestural cues  Exercises General Exercises - Lower Extremity Ankle Circles/Pumps: AROM, Both, 10 reps, Supine Long Arc Quad: Both, 5 reps, Seated, AROM Heel Slides: AAROM, Both, 5 reps, Supine Hip Flexion/Marching: AROM, Both, Seated, 5 reps    General Comments        Pertinent Vitals/Pain Pain  Assessment Pain Assessment: Faces Faces Pain Scale: Hurts little more Pain Location: B ankles/feet Pain Descriptors / Indicators: Discomfort, Guarding, Sharp, Shooting Pain Intervention(s): Limited activity within patient's tolerance, Monitored during session, Repositioned    Home Living                          Prior Function            PT Goals (current goals can now be found in the care plan section) Acute Rehab PT Goals Patient Stated Goal: get well, return home Progress towards PT goals: Progressing toward goals    Frequency    Min 2X/week      PT Plan      Co-evaluation              AM-PAC PT 6 Clicks Mobility   Outcome Measure  Help needed turning from your back to your side while in a flat bed without using bedrails?: A Little Help needed moving from lying on your back to sitting on the side of a flat bed without using bedrails?: A Little Help needed moving to and from a bed to a chair (including a wheelchair)?: A Little Help needed standing up from a chair using your arms (e.g., wheelchair or bedside chair)?: A Lot Help needed to walk in hospital room?: A Little Help needed climbing 3-5 steps with a railing? : Total 6 Click Score: 15    End of Session Equipment Utilized During Treatment: Gait belt Activity Tolerance: Patient limited by fatigue;Patient limited by pain Patient left: with call bell/phone within reach;in bed;with family/visitor present (sitting on EOB) Nurse Communication: Mobility status (IV was laying in bed on arrival to room. Pt was unaware that it had come out.  Pt asking to be weighed and asked nurse about this.) PT Visit Diagnosis: Unsteadiness on feet (R26.81);Other abnormalities of gait and mobility (R26.89);Muscle weakness (generalized) (M62.81);History of falling (Z91.81);Difficulty in walking, not elsewhere classified (R26.2);Other symptoms and signs involving the nervous system (R29.898);Pain     Time:  8892-8864 PT Time Calculation (min) (ACUTE ONLY): 28 min  Charges:    $Gait Training: 8-22 mins $Therapeutic Exercise: 8-22 mins PT General Charges $$ ACUTE PT VISIT: 1 Visit                     Lakaisha Danish M,PT Acute Rehab Services [phone]    Stephane JULIANNA Bevel 01/23/2024, 1:35 PM

## 2024-01-23 NOTE — Plan of Care (Signed)

## 2024-01-24 ENCOUNTER — Inpatient Hospital Stay (HOSPITAL_COMMUNITY)

## 2024-01-24 DIAGNOSIS — R579 Shock, unspecified: Secondary | ICD-10-CM | POA: Diagnosis not present

## 2024-01-24 DIAGNOSIS — N184 Chronic kidney disease, stage 4 (severe): Secondary | ICD-10-CM | POA: Diagnosis not present

## 2024-01-24 DIAGNOSIS — N179 Acute kidney failure, unspecified: Secondary | ICD-10-CM | POA: Diagnosis not present

## 2024-01-24 DIAGNOSIS — I10 Essential (primary) hypertension: Secondary | ICD-10-CM | POA: Diagnosis not present

## 2024-01-24 LAB — RENAL FUNCTION PANEL
Albumin: 3.2 g/dL — ABNORMAL LOW (ref 3.5–5.0)
Anion gap: 12 (ref 5–15)
BUN: 91 mg/dL — ABNORMAL HIGH (ref 8–23)
CO2: 21 mmol/L — ABNORMAL LOW (ref 22–32)
Calcium: 9.4 mg/dL (ref 8.9–10.3)
Chloride: 103 mmol/L (ref 98–111)
Creatinine, Ser: 3.07 mg/dL — ABNORMAL HIGH (ref 0.61–1.24)
GFR, Estimated: 22 mL/min — ABNORMAL LOW
Glucose, Bld: 102 mg/dL — ABNORMAL HIGH (ref 70–99)
Phosphorus: 5.1 mg/dL — ABNORMAL HIGH (ref 2.5–4.6)
Potassium: 5 mmol/L (ref 3.5–5.1)
Sodium: 136 mmol/L (ref 135–145)

## 2024-01-24 MED ORDER — SODIUM ZIRCONIUM CYCLOSILICATE 10 G PO PACK
10.0000 g | PACK | Freq: Every day | ORAL | Status: DC
  Administered 2024-01-24 – 2024-01-26 (×3): 10 g via ORAL
  Filled 2024-01-24 (×3): qty 1

## 2024-01-24 MED ORDER — TORSEMIDE 20 MG PO TABS
10.0000 mg | ORAL_TABLET | Freq: Every day | ORAL | Status: DC
  Administered 2024-01-24 – 2024-01-25 (×2): 10 mg via ORAL
  Filled 2024-01-24 (×2): qty 1

## 2024-01-24 NOTE — Progress Notes (Signed)
 [facility] Renal Consultation Note  Requesting MD: Rai Indication for Consultation:  AKI on CKD4, hyperkalemia  Chief complaint: ams   HPI:  Peter Bryan is a 64 y.o. male PMHx HTN, CKD stage IV, hyperkalemia, T2DM, OSA, chronic pain presented initially with AMS. Found to be hypotensive, hyperkalemic, severe metabolic acidosis in septic shock 2/2 pneumonia requiring pressors and CRRT. Nephrology signed off 1/13. Reconsulted 1/19 for worsening creatinine, hyperkalemia.   Creatinine, Ser  Date/Time Value Ref Range Status  01/24/2024 03:41 AM 3.07 (H) 0.61 - 1.24 mg/dL Final  98/80/7973 87:86 AM 2.92 (H) 0.61 - 1.24 mg/dL Final  98/81/7973 96:43 AM 2.72 (H) 0.61 - 1.24 mg/dL Final  98/82/7973 87:51 AM 2.78 (H) 0.61 - 1.24 mg/dL Final  98/83/7973 96:63 AM 2.86 (H) 0.61 - 1.24 mg/dL Final  98/85/7973 88:62 PM 2.93 (H) 0.61 - 1.24 mg/dL Final  98/86/7973 87:82 AM 3.09 (H) 0.61 - 1.24 mg/dL Final  98/89/7973 89:69 AM 3.02 (H) 0.61 - 1.24 mg/dL Final  98/90/7973 97:81 PM 3.05 (H) 0.61 - 1.24 mg/dL Final  98/91/7973 96:75 AM 3.18 (H) 0.61 - 1.24 mg/dL Final  98/92/7973 96:94 PM 4.43 (H) 0.61 - 1.24 mg/dL Final  98/92/7973 94:58 AM 5.80 (H) 0.61 - 1.24 mg/dL Final  98/92/7973 94:58 AM 5.82 (H) 0.61 - 1.24 mg/dL Final  98/93/7973 88:85 PM 6.84 (H) 0.61 - 1.24 mg/dL Final  98/93/7973 94:41 PM 6.97 (H) 0.61 - 1.24 mg/dL Final  90/69/7976 97:83 PM 3.98 (H) 0.61 - 1.24 mg/dL Final  95/81/7976 89:82 PM 2.84 (H) 0.61 - 1.24 mg/dL Final  87/85/7977 87:54 PM 2.47 (H) 0.61 - 1.24 mg/dL Final  96/96/7978 98:80 PM 1.63 (H) 0.61 - 1.24 mg/dL Final     PMHx:   Past Medical History:  Diagnosis Date   Chronic kidney disease    Diabetes mellitus without complication (HCC)    Hypertension    Neuromuscular disorder (HCC)    neuropathy feet   Obesity    Sleep apnea     Past Surgical History:  Procedure Laterality Date   BACK SURGERY  1/92-08/2016   multiple levels. C5-S1    CARPAL TUNNEL RELEASE     CERVICAL SPINE SURGERY     ROTATOR CUFF REPAIR     SHOULDER ARTHROSCOPY WITH OPEN ROTATOR CUFF REPAIR AND DISTAL CLAVICLE ACROMINECTOMY Right 03/16/2019   Procedure: SHOULDER ARTHROSCOPY WITH OPEN ROTATOR CUFF REPAIR AND DISTAL CLAVICLE ACROMINECTOMY;  Surgeon: Shari Sieving, MD;  Location: [facility];  Service: Orthopedics;  Laterality: Right;    Family Hx: History reviewed. No pertinent family history.  Social History:  reports that he has never smoked. He has never used smokeless tobacco. He reports that he does not drink alcohol and does not use drugs.  Allergies: Allergies[1]  Medications: Prior to Admission medications  Medication Sig Start Date End Date Taking? Authorizing Provider  baclofen  (LIORESAL ) 10 MG tablet Take 10 mg by mouth at bedtime.   Yes [provider]  Cholecalciferol (VITAMIN D ) 50 MCG (2000 UT) tablet Take 1 tablet (2,000 Units total) by mouth daily. 01/20/24  Yes Rai, Ripudeep K, MD  gabapentin  (NEURONTIN ) 300 MG capsule Take 300 mg by mouth 4 (four) times daily. 07/22/08  Yes [provider]  hydrALAZINE (APRESOLINE) 25 MG tablet Take 25 mg by mouth 2 (two) times daily. 09/04/21  Yes [provider]  HYDROcodone -acetaminophen  (NORCO) 10-325 MG per tablet Take 2-2.5 tablets by mouth See admin instructions. Take 2.5 mg in the morning and at lunch  as 2 tables in the evening   Yes [provider]  JARDIANCE  10 MG TABS tablet TAKE 1 TABLET DAILY BEFORE BREAKFAST 07/29/23  Yes Lonni Slain, MD  LINZESS  290 MCG CAPS capsule Take 290 mcg by mouth daily.   Yes [provider]  MAGNESIUM PO Take 1 capsule by mouth at bedtime.   Yes [provider]  rosuvastatin (CRESTOR) 5 MG tablet Take 5 mg by mouth daily.     Yes [provider]  testosterone  cypionate (DEPOTESTOSTERONE CYPIONATE) 200 MG/ML injection Inject 200 mg into the muscle once a week. 02/24/19  Yes [provider]   thiamine  50 MG tablet Take 50 mg by mouth 2 (two) times daily.   Yes [provider]  Turmeric (QC TUMERIC COMPLEX PO) Take 4,500 mg by mouth daily at 12 noon.   Yes [provider]  valsartan-hydrochlorothiazide  (DIOVAN-HCT) 320-25 MG tablet Take 1 tablet by mouth at bedtime. Midnight   Yes [provider]  Vitamin D , Ergocalciferol , (DRISDOL) 1.25 MG (50000 UNIT) CAPS capsule Take 50,000 Units by mouth 2 (two) times a week.   Yes [provider]  acetaminophen  (TYLENOL ) 325 MG tablet Take 2 tablets (650 mg total) by mouth every 6 (six) hours as needed for fever or mild pain (pain score 1-3). 01/20/24   Rai, Ripudeep MARLA, MD  gabapentin  (NEURONTIN ) 100 MG capsule Take 1 capsule (100 mg total) by mouth 4 (four) times daily. 01/20/24   Rai, Nydia MARLA, MD  HYDROcodone -acetaminophen  (NORCO/VICODIN) 5-325 MG tablet Take 1 tablet by mouth every 6 (six) hours as needed for severe pain (pain score 7-10). 01/20/24   Rai, Ripudeep K, MD  multivitamin (RENA-VIT) TABS tablet Take 1 tablet by mouth at bedtime. 01/20/24   Rai, Nydia MARLA, MD  polyethylene glycol (MIRALAX  / GLYCOLAX ) 17 g packet Take 17 g by mouth daily. 01/21/24   Rai, Nydia MARLA, MD  senna (SENOKOT) 8.6 MG TABS tablet Take 1 tablet (8.6 mg total) by mouth 2 (two) times daily. 01/20/24   Rai, Nydia MARLA, MD  sodium bicarbonate  650 MG tablet Take 650 mg by mouth daily. Patient not taking: Reported on 01/11/2024 09/26/20   [provider]  tamsulosin  (FLOMAX ) 0.4 MG CAPS capsule Take 1 capsule (0.4 mg total) by mouth daily. 01/20/24   Rai, Nydia MARLA, MD  torsemide  (DEMADEX ) 20 MG tablet Take 1 tablet (20 mg total) by mouth daily. 01/21/24   Rai, Nydia MARLA, MD  zolpidem  (AMBIEN ) 10 MG tablet Take 1 tablet (10 mg total) by mouth at bedtime as needed for sleep. 01/20/24   Rai, Nydia MARLA, MD    Scheduled:  gabapentin   100 mg Oral QID   heparin   5,000 Units Subcutaneous Q8H   linaclotide   290 mcg Oral QAC  breakfast   multivitamin  1 tablet Oral QHS   polyethylene glycol  17 g Oral Daily   senna  1 tablet Oral BID   sodium chloride  flush  10-40 mL Intracatheter Q12H   sodium zirconium cyclosilicate   10 g Oral Daily   tamsulosin   0.4 mg Oral Daily   torsemide   10 mg Oral Daily    Labs:     Latest Ref Rng & Units 01/24/2024    3:41 AM 01/23/2024   12:13 AM 01/22/2024    3:56 AM  BMP  Glucose 70 - 99 mg/dL 897  899  898   BUN 8 - 23 mg/dL 91  90  86   Creatinine 0.61 - 1.24  mg/dL 6.92  7.07  7.27   Sodium 135 - 145 mmol/L 136  134  136   Potassium 3.5 - 5.1 mmol/L 5.0  6.1  5.6   Chloride 98 - 111 mmol/L 103  100  102   CO2 22 - 32 mmol/L 21  22  22    Calcium  8.9 - 10.3 mg/dL 9.4  9.3  9.2     Urinalysis    Component Value Date/Time   COLORURINE YELLOW 01/10/2024 2358   APPEARANCEUR CLOUDY (A) 01/10/2024 2358   LABSPEC 1.013 01/10/2024 2358   PHURINE 5.0 01/10/2024 2358   GLUCOSEU NEGATIVE 01/10/2024 2358   HGBUR LARGE (A) 01/10/2024 2358   BILIRUBINUR NEGATIVE 01/10/2024 2358   KETONESUR NEGATIVE 01/10/2024 2358   PROTEINUR 30 (A) 01/10/2024 2358   NITRITE NEGATIVE 01/10/2024 2358   LEUKOCYTESUR LARGE (A) 01/10/2024 2358     ROS:  Pertinent items are noted in HPI.  Physical Exam: Vitals:   01/24/24 0339 01/24/24 0804  BP: 117/61 (!) 125/50  Pulse: (!) 59 70  Resp: 18 18  Temp: 97.6 F (36.4 C) 98.7 F (37.1 C)  SpO2: 97% 96%     General: well appearing, NAD Heart: RRR, no mrg Lungs: CTAB, breathing comfortably on RA Abdomen: soft, non-distended Extremities: no swelling BLE  Assessment/Plan: #AKI on CKD4 CRRT d/c 1/8. Cr 3.07 this morning, from 2.9 yesterday. Unclear baseline prior to admission but appears around 2.8 in 2023.  - decrease Torsemide  to 10mg  daily - renal US  today - Monitor I/O - Avoid nephrotoxic agents - Renal diet   #Hyperkalemia K 5 this morning, improved. Urine studies (potassium creatinine ratio) suggest extra-renal cause of  hyperkalemia - continue Lokelma  10g daily - Will continue prophylactic heparin , but could be contributing due to its effect in lowering aldosterone concentrations - am RFP   Resolved: septic shock, acute metabolic encephalopathy, severe metabolic acidosis Per primary: HTN, T2DM, BPH   Lanita Stammen 01/24/2024, 9:09 AM        [1]  Allergies Allergen Reactions   Nsaids Other (See Comments)    Contraindication due to CKD 4

## 2024-01-24 NOTE — Progress Notes (Signed)
 Mobility Specialist: Progress Note   01/24/24 1000  Mobility  Activity Ambulated with assistance  Level of Assistance Contact guard assist, steadying assist  Assistive Device Front wheel Kreager  Distance Ambulated (ft) 65 ft  Activity Response Tolerated well  Mobility Referral Yes  Mobility visit 1 Mobility  Mobility Specialist Start Time (ACUTE ONLY) 0954  Mobility Specialist Stop Time (ACUTE ONLY) 1013  Mobility Specialist Time Calculation (min) (ACUTE ONLY) 19 min    Pt received in bed, agreeable to mobility session. Daughter-in-law present and helpful. SV for bed mobility. CGA for STS and ambulation. C/o increased LE pain. Returned to room and requested to use BR. Small BM successful, totA for pericare done in standing. Afterwards, agreed to sit in the chair for a little while. Left in chair with all needs met, call bell in reach.   Peter Bryan Mobility Specialist Please contact via SecureChat or Rehab office at [phone]

## 2024-01-24 NOTE — Progress Notes (Addendum)
 Occupational Therapy Treatment Patient Details Name: Peter Bryan MRN: [id number] DOB: Mar 21, 1960 Today's Date: 01/24/2024   History of present illness 64 y.o. male admitted 01/10/24 with worsening weakness, AMS, SOB. Workup for septic shock, severe hyperkalemia, AKI on CKD IV requiring CRRT 1/6-1/8. PMH includes HTN, DM2, CKD IV, multiple back sxs, chronic pain syndrome.   OT comments  Pt progressing well towards OT goals. Focus of session on progressing functional mobility and increasing independent engagement in ADL tasks OOB. Pt required up to Min A for functional transfer to [facility], and up to Total A for toileting tasks this date. Improvements noted with LB dressing. OT to continue to follow Pt acutely, continue per POC.       If plan is discharge home, recommend the following:  Assistance with cooking/housework;Direct supervision/assist for medications management;Direct supervision/assist for financial management;Assist for transportation;Help with stairs or ramp for entrance;A lot of help with walking and/or transfers;A lot of help with bathing/dressing/bathroom   Equipment Recommendations  BSC/3in1;Tub/shower bench    Recommendations for Other Services      Precautions / Restrictions Precautions Precautions: Fall Recall of Precautions/Restrictions: Impaired Restrictions Weight Bearing Restrictions Per Provider Order: No       Mobility Bed Mobility Overal bed mobility: Needs Assistance Bed Mobility: Supine to Sit, Sit to Supine     Supine to sit: Supervision Sit to supine: Supervision   General bed mobility comments: Supervision for bed mobility, no physical assistance required to manage BLE or to repostion in bed. Pt able to scoot along EOB without assistance    Transfers Overall transfer level: Needs assistance Equipment used: Rolling N[place] (2 wheels) Transfers: Sit to/from Stand Sit to Stand: Min assist           General transfer comment: Min A to stand from  bed slightly elevated. Good recall of proper hand placement on RW on sit to stand at bed and BSC. Pt able to ambulate to [facility] over toilet with Min A. Pt able to scoot along EOB to reposition without assistance.     Balance Overall balance assessment: Needs assistance Sitting-balance support: No upper extremity supported, Feet supported Sitting balance-Leahy Scale: Good Sitting balance - Comments: Close supervision EOB   Standing balance support: Bilateral upper extremity supported, During functional activity, Reliant on assistive device for balance Standing balance-Leahy Scale: Poor Standing balance comment: Dependent on RW and external support                           ADL either performed or assessed with clinical judgement   ADL Overall ADL's : Needs assistance/impaired             Lower Body Bathing: Maximal assistance       Lower Body Dressing: Moderate assistance Lower Body Dressing Details (indicate cue type and reason): Can don clothing on RLE, difficult with LLE. Able to figure four RLE to don shoe Toilet Transfer: Minimal assistance;Ambulation;BSC/3in1;Rolling [place] (2 wheels) Toilet Transfer Details (indicate cue type and reason): BSC over toilet Toileting- Clothing Manipulation and Hygiene: Total assistance Toileting - Clothing Manipulation Details (indicate cue type and reason): Total A posterior peri care in standing, remains with cath.            Extremity/Trunk Assessment Upper Extremity Assessment Upper Extremity Assessment: Overall WFL for tasks assessed            Vision       Perception     Praxis  Communication Communication Communication: No apparent difficulties   Cognition Arousal: Alert Behavior During Therapy: Flat affect Cognition: No apparent impairments             OT - Cognition Comments: No apparent cognitive deficits observed this session                 Following commands: Intact        Cueing    Cueing Techniques: Verbal cues  Exercises      Shoulder Instructions       General Comments VSS on RA.    Pertinent Vitals/ Pain       Pain Assessment Pain Assessment: Faces Faces Pain Scale: Hurts even more Pain Location: bilateral ankles and feet Pain Descriptors / Indicators: Grimacing, Discomfort Pain Intervention(s): Limited activity within patient's tolerance, Monitored during session, Repositioned  Home Living                                          Prior Functioning/Environment              Frequency  Min 2X/week        Progress Toward Goals  OT Goals(current goals can now be found in the care plan section)  Progress towards OT goals: Progressing toward goals  Acute Rehab OT Goals Patient Stated Goal: to get better OT Goal Formulation: With patient/family Time For Goal Achievement: 01/26/24 Potential to Achieve Goals: Good  Plan      Co-evaluation                 AM-PAC OT 6 Clicks Daily Activity     Outcome Measure   Help from another person eating meals?: A Little Help from another person taking care of personal grooming?: A Little Help from another person toileting, which includes using toliet, bedpan, or urinal?: Total Help from another person bathing (including washing, rinsing, drying)?: A Lot Help from another person to put on and taking off regular upper body clothing?: A Little Help from another person to put on and taking off regular lower body clothing?: A Lot 6 Click Score: 14    End of Session Equipment Utilized During Treatment: Gait belt;Rolling [employer]el (2 wheels)  OT Visit Diagnosis: Unsteadiness on feet (R26.81);Other abnormalities of gait and mobility (R26.89);Muscle weakness (generalized) (M62.81);Other symptoms and signs involving cognitive function   Activity Tolerance Patient tolerated treatment well   Patient Left in bed;with call bell/phone within reach   Nurse Communication Other  (comment) (Requires sterile water  for Cpap)        Time: 1310-1335 OT Time Calculation (min): 25 min  Charges: OT General Charges $OT Visit: 1 Visit OT Treatments $Self Care/Home Management : 23-37 mins  Maurilio CROME, OTR/L.  [facility] Acute Rehabilitation  Office: [phone]   Maurilio PARAS Mohid Furuya 01/24/2024, 3:50 PM

## 2024-01-24 NOTE — Progress Notes (Addendum)
 "          [facility]                                                                              Peter Bryan, is a 64 y.o. male, DOB - 1960/11/29, FMW:[id number] Admit date - 01/10/2024    Outpatient Primary MD for the patient is Turbyfill, Silvano I, NP  LOS - 14  days  Chief Complaint  Patient presents with   Fatigue       Brief summary   Patient is a 64 year old male with HTN, CKD stage IV, recurrent hyperkalemia, DM2, OSA, morbid obesity, multiple back surgeries, chronic pain syndrome presented to ED with altered mental status.  At baseline ambulates with a Seelye/cane.  In ED was found to be profoundly hypotensive, hyperkalemia and severe metabolic acidosis initially required pressors and CRRT.  Pneumonia was presumed to be etiology for his septic shock.  Off of pressors as of 1/8 and was transferred to the floor on 1/9.  Awaiting SNF.  Assessment & Plan     Septic shock in the setting of left lower lobe pneumonia, resolved -; hypothermic with leukocytosis and source LLL PNA noted - CT chest abdomen pelvis showed opacity in the left lower lobe possibly representing early pneumonia. - Off pressors as of 1/8 - Completed doxycycline , ceftriaxone  on 1/11 - No acute issues    Acute metabolic encephalopathy, resolved - Multifactorial in the setting of shock, infection, uremia - Resolved, currently alert and oriented, at baseline   AKI on CKD4 Severe metabolic acidosis,  High anion gap metabolic acidosis, resolved Severe hyperkalemia - Presumably secondary to shock as above.  Nephrology consulted. - CRRT discontinued 1/8,  temporary HD catheter removed. - Creatinine was improving up until 1/19, then started trending up again with persistent hyperkalemia.  -Nephrology consulted again, started on IV fluids, received Lokelma  3 times daily - Creatinine still trending up 3.0, torsemide  decreased to 10 mg, follow renal ultrasound  Persistent hyperkalemia - Continue to  hold ARB - K improving with Lokelma   Ambulatory dysfunction, acute  - PT OT evaluation recommended SNF   HTN  - Currently on torsemide , decrease to 10 mg daily  Gout exacerbation ruled out  - Uric acid levels within normal limits   DM 2, non-insulin -dependent, well controlled  -HbA1c 5.4 well-controlled   BPH No acute issues   Constipation - Continue MiraLAX , Senokot S, lactulose  x 1   Mild malnutrition  Nutrition Problem: Increased nutrient needs Etiology: acute illness  Signs/Symptoms: estimated needs  Interventions: MVI, Ensure Enlive (each supplement provides 350kcal and 20 grams of protein), Refer to RD note for recommendations, Liberalize Diet   Obesity class III Estimated body mass index is 37.21 kg/m as calculated from the following:   Height as of this encounter: 5' 11 (1.803 m).   Weight as of this encounter: 121 kg.  Code Status: full  DVT Prophylaxis:  heparin  injection 5,000 Units Start: 01/11/24 0600 SCDs Start: 01/10/24 2209   Level of Care: Level of care: Med-Surg Family Communication:  Disposition Plan:      Remains inpatient appropriate:   Awaiting SNF   Procedures:  HD  Consultants:   PCCM  Nephrology  Antimicrobials:   Anti-infectives (From admission, onward)    Start     Dose/Rate Route Frequency Ordered Stop   01/11/24 1500  doxycycline  (VIBRAMYCIN ) 100 mg in sodium chloride  0.9 % 250 mL IVPB        100 mg 125 mL/hr over 120 Minutes Intravenous Every 12 hours 01/11/24 1427 01/16/24 0050   01/11/24 1400  cefTRIAXone  (ROCEPHIN ) 2 g in sodium chloride  0.9 % 100 mL IVPB        2 g 200 mL/hr over 30 Minutes Intravenous Every 24 hours 01/11/24 0952 01/15/24 1611   01/11/24 0800  vancomycin  (VANCOREADY) IVPB 1500 mg/300 mL  Status:  Discontinued        1,500 mg 150 mL/hr over 120 Minutes Intravenous Daily 01/10/24 2234 01/11/24 0950   01/11/24 0200  piperacillin -tazobactam (ZOSYN ) IVPB 3.375 g  Status:  Discontinued        3.375  g 100 mL/hr over 30 Minutes Intravenous Every 6 hours 01/10/24 2234 01/11/24 0952   01/10/24 1800  ceFEPIme  (MAXIPIME ) 2 g in sodium chloride  0.9 % 100 mL IVPB        2 g 200 mL/hr over 30 Minutes Intravenous  Once 01/10/24 1753 01/10/24 1847   01/10/24 1800  metroNIDAZOLE  (FLAGYL ) IVPB 500 mg        500 mg 100 mL/hr over 60 Minutes Intravenous  Once 01/10/24 1753 01/10/24 1919   01/10/24 1800  vancomycin  (VANCOCIN ) IVPB 1000 mg/200 mL premix        1,000 mg 200 mL/hr over 60 Minutes Intravenous  Once 01/10/24 1753 01/10/24 1952          Medications  gabapentin   100 mg Oral QID   heparin   5,000 Units Subcutaneous Q8H   linaclotide   290 mcg Oral QAC breakfast   multivitamin  1 tablet Oral QHS   polyethylene glycol  17 g Oral Daily   senna  1 tablet Oral BID   sodium chloride  flush  10-40 mL Intracatheter Q12H   sodium zirconium cyclosilicate   10 g Oral Daily   tamsulosin   0.4 mg Oral Daily   torsemide   10 mg Oral Daily      Subjective:   Peter Bryan was seen and examined today.  Per patient, no acute issues.  No nausea vomiting, diarrhea or other infectious symptoms.   Objective:   Vitals:   01/23/24 1651 01/23/24 1936 01/24/24 0339 01/24/24 0804  BP: 137/60 (!) 142/60 117/61 (!) 125/50  Pulse: 70 69 (!) 59 70  Resp: 18 18 18 18   Temp: 98.3 F (36.8 C) 98.2 F (36.8 C) 97.6 F (36.4 C) 98.7 F (37.1 C)  TempSrc:      SpO2: (!) 88% 100% 97% 96%  Weight:      Height:        Intake/Output Summary (Last 24 hours) at 01/24/2024 0957 Last data filed at 01/24/2024 0804 Gross per 24 hour  Intake --  Output 2900 ml  Net -2900 ml     Wt Readings from Last 3 Encounters:  01/23/24 121 kg  09/03/22 130.1 kg  01/21/22 (!) 139.5 kg    Physical Exam General: Alert and oriented x 3, NAD Cardiovascular: S1 S2 clear, RRR.  Respiratory: CTAB, no wheezing Gastrointestinal: Soft, nontender, nondistended, NBS Ext: no pedal edema bilaterally Neuro: no new  deficits Psych: Normal affect     Data Reviewed:  I have personally reviewed following labs    CBC Lab Results  Component Value Date   WBC  11.7 (H) 01/14/2024   RBC 2.92 (L) 01/14/2024   HGB 8.4 (L) 01/14/2024   HCT 26.8 (L) 01/14/2024   MCV 91.8 01/14/2024   MCH 28.8 01/14/2024   PLT 120 (L) 01/14/2024   MCHC 31.3 01/14/2024   RDW 13.4 01/14/2024   LYMPHSABS 1.1 01/10/2024   MONOABS 0.7 01/10/2024   EOSABS 0.0 01/10/2024   BASOSABS 0.0 01/10/2024     Last metabolic panel Lab Results  Component Value Date   NA 136 01/24/2024   K 5.0 01/24/2024   CL 103 01/24/2024   CO2 21 (L) 01/24/2024   BUN 91 (H) 01/24/2024   CREATININE 3.07 (H) 01/24/2024   GLUCOSE 102 (H) 01/24/2024   GFRNONAA 22 (L) 01/24/2024   GFRAA 52 (L) 03/07/2019   CALCIUM  9.4 01/24/2024   PHOS 5.1 (H) 01/24/2024   PROT 6.2 (L) 01/10/2024   ALBUMIN 3.2 (L) 01/24/2024   BILITOT 0.3 01/10/2024   ALKPHOS 72 01/10/2024   AST 11 (L) 01/10/2024   ALT 18 01/10/2024   ANIONGAP 12 01/24/2024    CBG (last 3)  No results for input(s): GLUCAP in the last 72 hours.    Coagulation Profile: No results for input(s): INR, PROTIME in the last 168 hours.   Radiology Studies: I have personally reviewed the imaging studies  No results found.     Nydia Distance M.D. [facility] 01/24/2024, 9:57 AM  Available via Epic secure chat 7am-7pm After 7 pm, please refer to night coverage provider listed on amion.    "

## 2024-01-24 NOTE — Progress Notes (Signed)
 Per handoff report nightshift states patient was given PRN HYDROcodone -acetaminophen  (NORCO/VICODIN) 5-325 MG per tablet 1 tablet at approx. 0630. Medication not scanned to [facility]. Patient confirmed receiving pain medication at that time. Peter Bryan

## 2024-01-24 NOTE — TOC Progression Note (Signed)
 Transition of Care [facility]) - Progression Note    Patient Details  Name: Peter Bryan MRN: [id number] Date of Birth: April 24, 1960  [facility] CM/SW Contact  Lendia Dais, [place] Phone Number: 01/24/2024, 4:06 PM  Clinical Narrative: CSW spoke to [place] who stated that she could reach out to the pt's spouse and follow up to answer any questions.   CSW spoke to pt's spouse via phone and informed them of [name] wanting to reach out and [name] was agreeable. [name] inquired about if the facilities could be switched if they were not agreeable with [facility]. CSW stated they could inquire but if not, once the pt is medically ready they would have to discharge to [facility] and inquire with the SNF social worker about transferring. [name] stated understanding.  CSW will continue to monitor for medical stability.                      Expected Discharge Plan and Services                                               Social Drivers of Health (SDOH) Interventions SDOH Screenings   Food Insecurity: No Food Insecurity (01/13/2024)  Housing: Low Risk (01/13/2024)  Transportation Needs: No Transportation Needs (01/13/2024)  Utilities: Not At Risk (01/13/2024)  Financial Resource Strain: Patient Declined (08/13/2023)   Received from [facility]  Physical Activity: Inactive (08/13/2023)   Received from [facility]  Social Connections: Socially Integrated (01/13/2024)  Stress: No Stress Concern Present (08/13/2023)   Received from [facility]  Tobacco Use: Low Risk (01/10/2024)    Readmission Risk Interventions    01/13/2024    1:16 PM  Readmission Risk Prevention Plan  Transportation Screening Complete  PCP or Specialist Appt within 5-7 Days Complete  Home Care Screening Complete  Medication Review (RN CM) Referral to Pharmacy

## 2024-01-25 DIAGNOSIS — R579 Shock, unspecified: Secondary | ICD-10-CM | POA: Diagnosis not present

## 2024-01-25 LAB — BASIC METABOLIC PANEL WITH GFR
Anion gap: 12 (ref 5–15)
BUN: 96 mg/dL — ABNORMAL HIGH (ref 8–23)
CO2: 21 mmol/L — ABNORMAL LOW (ref 22–32)
Calcium: 9.4 mg/dL (ref 8.9–10.3)
Chloride: 103 mmol/L (ref 98–111)
Creatinine, Ser: 3.12 mg/dL — ABNORMAL HIGH (ref 0.61–1.24)
GFR, Estimated: 21 mL/min — ABNORMAL LOW
Glucose, Bld: 98 mg/dL (ref 70–99)
Potassium: 5.6 mmol/L — ABNORMAL HIGH (ref 3.5–5.1)
Sodium: 135 mmol/L (ref 135–145)

## 2024-01-25 MED ORDER — BACLOFEN 10 MG PO TABS
10.0000 mg | ORAL_TABLET | Freq: Every day | ORAL | Status: DC
  Administered 2024-01-25 – 2024-01-29 (×5): 10 mg via ORAL
  Filled 2024-01-25 (×5): qty 1

## 2024-01-25 MED ORDER — BACLOFEN 10 MG PO TABS: 5.0000 mg | ORAL_TABLET | Freq: Every day | ORAL | Status: DC

## 2024-01-25 MED ORDER — SODIUM ZIRCONIUM CYCLOSILICATE 10 G PO PACK
10.0000 g | PACK | Freq: Once | ORAL | Status: AC
  Administered 2024-01-25: 10 g via ORAL
  Filled 2024-01-25: qty 1

## 2024-01-25 NOTE — Plan of Care (Signed)
  Problem: Health Behavior/Discharge Planning: Goal: Ability to manage health-related needs will improve Outcome: Progressing   Problem: Activity: Goal: Risk for activity intolerance will decrease Outcome: Progressing   Problem: Nutrition: Goal: Adequate nutrition will be maintained Outcome: Progressing   

## 2024-01-25 NOTE — TOC Progression Note (Signed)
 Transition of Care [facility]) - Progression Note    Patient Details  Name: Peter Bryan MRN: [id number] Date of Birth: [birth year]  Transition of Care [facility]) CM/SW Contact  Lendia Dais, [place] Phone Number: 01/25/2024, 1:34 PM  Clinical Narrative: Pt is pending medical stability for discharge. Pt was stopped on torsemide  was stopped today and will continue to be monitored for stable creatinine levels for the next 24-48 hours per Nephrologist. CSW informed [place].   CSW will continue to monitor.                       Expected Discharge Plan and Services                                               Social Drivers of Health (SDOH) Interventions SDOH Screenings   Food Insecurity: No Food Insecurity (01/13/2024)  Housing: Low Risk (01/13/2024)  Transportation Needs: No Transportation Needs (01/13/2024)  Utilities: Not At Risk (01/13/2024)  Financial Resource Strain: Patient Declined (08/13/2023)   Received from [facility]  Physical Activity: Inactive (08/13/2023)   [facility] from [facility]  Social Connections: Socially Integrated (01/13/2024)  Stress: No Stress Concern Present (08/13/2023)   Received from [facility]  Tobacco Use: Low Risk (01/10/2024)    Readmission Risk Interventions    01/13/2024    1:16 PM  Readmission Risk Prevention Plan  Transportation Screening Complete  PCP or Specialist Appt within 5-7 Days Complete  Home Care Screening Complete  Medication Review (RN CM) Referral to Pharmacy

## 2024-01-25 NOTE — Progress Notes (Signed)
 " PROGRESS NOTE  Peter Bryan  DOB: Jul 04, 1960  PCP: Shlomo Castilla I, NP FMW:[id number]  DOA: 01/10/2024  LOS: 15 days  Hospital Day: 16  Subjective: Patient was seen and examined this morning. Pleasant elderly Caucasian male.  Sitting up in bed.  Not in distress.  Daughter-in-law at bedside. Afebrile, hemodynamically stable, breathing on room air  Brief narrative: Peter Bryan is a 64 y.o. male with PMH significant for obesity, OSA, DM2, HTN, CKD 4, recurrent hyperkalemia, multiple back surgeries, chronic pain syndrome 1/6-patient was brought to the ED with altered mental status, weakness.  At baseline, was able to ambulate with a Kozuch/cane.  In the ED, patient was profoundly hypotensive, and hyperkalemia and severe metabolic acidosis. CT chest abdomen pelvis showed opacity in the left lower lobe possibly representing early pneumonia. Admitted to ICU for septic shock. Required pressors, CRRT 1/9-transferred to [facility] Pending placement  Assessment and plan: Septic shock POA - resolved Left lower lobe pneumonia Completed course of antibiotics. Currently breathing on room air   Acute metabolic encephalopathy, resolved Multifactorial in the setting of shock, infection, uremia Resolved, currently alert and oriented, at baseline   AKI on CKD4 Severe metabolic acidosis, Severe hyperkalemia Presumably secondary to shock as above.  Seen by nephrology.  Required CRRT 1/6-1/8.  Temporary HD catheter has been removed. Creatinine is stabilizing around 3.  Hyperkalemia persists. Diuretics adjustment per nephrology.  Noted the plan today to stop torsemide  relax fluid restriction to 1.8 L/day. Currently on Lokelma  daily Recent Labs  Lab 01/21/24 0048 01/22/24 0356 01/23/24 0013 01/24/24 0341 01/25/24 0353  NA 134* 136 134* 136 135  K 5.7* 5.6* 6.1* 5.0 5.6*  CL 101 102 100 103 103  CO2 23 22 22  21* 21*  GLUCOSE 100* 101* 100* 102* 98  BUN 85* 86* 90* 91* 96*  CREATININE  2.78* 2.72* 2.92* 3.07* 3.12*  CALCIUM  9.1 9.2 9.3 9.4 9.4  PHOS  --   --   --  5.1*  --    Ambulatory dysfunction, acute  PT OT recommended SNF   HTN  Currently on torsemide  10 mg daily   DM 2, well controlled  HbA1c 5.4 on 01/12/2024  BPH Continue Flomax  0.4 mg daily   Constipation Currently on Senokot twice daily, Linzess  daily, MiraLAX  daily   Obesity 2 Body mass index is 37.21 kg/m. Patient has been advised to make an attempt to improve diet and exercise patterns to aid in weight loss.   Nutrition Status: Nutrition Problem: Increased nutrient needs Etiology: acute illness Signs/Symptoms: estimated needs Interventions: MVI, Ensure Enlive (each supplement provides 350kcal and 20 grams of protein), Refer to RD note for recommendations, Liberalize Diet   Mobility: Encourage ambulation  PT Orders: Active   PT Follow up Rec: Skilled Nursing-Short Term Rehab (<3 Hours/Day)01/23/2024 1300    Goals of care   Code Status: Full Code     DVT prophylaxis:  heparin  injection 5,000 Units Start: 01/11/24 0600 SCDs Start: 01/10/24 2209   Antimicrobials: None Fluid: None  Consultants: Nephrology Family Communication: Daughter-in-law at bedside  Status: Inpatient Level of care:  Med-Surg   Patient is from: Home Needs to continue in-hospital care: Continue to monitor creatinine and potassium. Anticipated d/c to: Hopefully SNF in 1 to 2 days      Diet:  Diet Order             Diet renal with fluid restriction Fluid restriction: 1800 mL Fluid; Room service appropriate? Yes; Fluid consistency: Thin  Diet  effective now                   Scheduled Meds:  baclofen   10 mg Oral QHS   gabapentin   100 mg Oral QID   heparin   5,000 Units Subcutaneous Q8H   linaclotide   290 mcg Oral QAC breakfast   multivitamin  1 tablet Oral QHS   polyethylene glycol  17 g Oral Daily   senna  1 tablet Oral BID   sodium chloride  flush  10-40 mL Intracatheter Q12H   sodium  zirconium cyclosilicate  10 g Oral Daily   sodium zirconium cyclosilicate   10 g Oral Once   tamsulosin   0.4 mg Oral Daily    PRN meds: acetaminophen , HYDROcodone -acetaminophen , mouth rinse, sodium chloride  flush, zolpidem    Infusions:    Antimicrobials: Anti-infectives (From admission, onward)    Start     Dose/Rate Route Frequency Ordered Stop   01/11/24 1500  doxycycline  (VIBRAMYCIN ) 100 mg in sodium chloride  0.9 % 250 mL IVPB        100 mg 125 mL/hr over 120 Minutes Intravenous Every 12 hours 01/11/24 1427 01/16/24 0050   01/11/24 1400  cefTRIAXone  (ROCEPHIN ) 2 g in sodium chloride  0.9 % 100 mL IVPB        2 g 200 mL/hr over 30 Minutes Intravenous Every 24 hours 01/11/24 0952 01/15/24 1611   01/11/24 0800  vancomycin  (VANCOREADY) IVPB 1500 mg/300 mL  Status:  Discontinued        1,500 mg 150 mL/hr over 120 Minutes Intravenous Daily 01/10/24 2234 01/11/24 0950   01/11/24 0200  piperacillin -tazobactam (ZOSYN ) IVPB 3.375 g  Status:  Discontinued        3.375 g 100 mL/hr over 30 Minutes Intravenous Every 6 hours 01/10/24 2234 01/11/24 0952   01/10/24 1800  ceFEPIme  (MAXIPIME ) 2 g in sodium chloride  0.9 % 100 mL IVPB        2 g 200 mL/hr over 30 Minutes Intravenous  Once 01/10/24 1753 01/10/24 1847   01/10/24 1800  metroNIDAZOLE  (FLAGYL ) IVPB 500 mg        500 mg 100 mL/hr over 60 Minutes Intravenous  Once 01/10/24 1753 01/10/24 1919   01/10/24 1800  vancomycin  (VANCOCIN ) IVPB 1000 mg/200 mL premix        1,000 mg 200 mL/hr over 60 Minutes Intravenous  Once 01/10/24 1753 01/10/24 1952       Objective: Vitals:   01/25/24 0425 01/25/24 0833  BP: 115/60 (!) 126/56  Pulse: (!) 52 70  Resp: 17 18  Temp: 98.2 F (36.8 C) 98.7 F (37.1 C)  SpO2: 96% 97%    Intake/Output Summary (Last 24 hours) at 01/25/2024 1418 Last data filed at 01/25/2024 0600 Gross per 24 hour  Intake 180 ml  Output 2000 ml  Net -1820 ml   Filed Weights   01/12/24 0500 01/18/24 0757 01/23/24 0500   Weight: 132 kg 133.5 kg 121 kg   Weight change:  Body mass index is 37.21 kg/m.   Physical Exam: General exam: Pleasant, elderly Caucasian male Skin: No rashes, lesions or ulcers. HEENT: Atraumatic, normocephalic, no obvious bleeding Lungs: Clear to auscultation bilaterally,  CVS: S1, S2, no murmur,   GI/Abd: Soft, nontender, nondistended, bowel sound present,   CNS: Alert, awake, oriented x 3 Psychiatry: Mood appropriate Extremities: Improving bilateral pedal edema, no calf tenderness,   Data Review: I have personally reviewed the laboratory data and studies available.  F/u labs ordered Unresulted Labs (From admission, onward)  Start     Ordered   01/26/24 0500  Renal function panel  Daily,   R     Question:  Specimen collection method  Answer:  Lab=Lab collect   01/25/24 1139            Signed, Chapman Rota, MD [facility] 01/25/2024  "

## 2024-01-25 NOTE — Progress Notes (Signed)
 Physical Therapy Treatment Patient Details Name: Peter Bryan MRN: [id number] DOB: [birth year] Today's Date: 01/25/2024   History of Present Illness 64 y.o. male admitted 01/10/24 with worsening weakness, AMS, SOB. Workup for septic shock, severe hyperkalemia, AKI on CKD IV requiring CRRT 1/6-1/8. PMH includes HTN, DM2, CKD IV, multiple back sxs, chronic pain syndrome.    PT Comments  Continuing work on functional mobility and activity tolerance;  Session focused on progressive ambulation and functional transfers, with very nice progress;  Performed multiple sit<>stand transfers with Min/CGA assist for safety; stood from lower bed height, and from [facility] over commode; showed improving activity tolerance as well, walking to/from bathroom, standing for pericare and hygiene, and tehn walking in the hallways; Majority of initial goals met -- updated PT goals   If plan is discharge home, recommend the following:     Can travel by private vehicle     No (Perhaps soon)  Equipment Recommendations  None recommended by PT    Recommendations for Other Services       Precautions / Restrictions Precautions Precautions: Fall Recall of Precautions/Restrictions: Impaired Required Braces or Orthoses:  (Much better able to walk with his shoes on; much less foot and ankle pain) Restrictions Weight Bearing Restrictions Per Provider Order: No     Mobility  Bed Mobility               General bed mobility comments: Sitting EOB upon PT arrival    Transfers Overall transfer level: Needs assistance Equipment used: Rolling Wilk (2 wheels) Transfers: Sit to/from Stand Sit to Stand: Min assist, Contact guard assist           General transfer comment: Min assist/close guard for safety; slow power up    Ambulation/Gait Ambulation/Gait assistance: Min assist, Contact guard assist Gait Distance (Feet): 100 Feet (+30 ft in room) Assistive device: Rolling Foglio (2 wheels) Gait  Pattern/deviations: Step-through pattern, Decreased stride length, Decreased step length - right, Decreased step length - left, Decreased dorsiflexion - left, Antalgic, Trunk flexed, Wide base of support       General Gait Details: Pt ambulated with RW with CGA assist progressing to  min assist at times for safety as pt fatigues and has pain in bil LEs.  Cues for sequencing steps and RW as well.  Did not need assist to steer RW. did not need to follow with chair.   Stairs             Wheelchair Mobility     Tilt Bed    Modified Rankin (Stroke Patients Only)       Balance     Sitting balance-Leahy Scale: Good Sitting balance - Comments: Close supervision EOB     Standing balance-Leahy Scale: Poor Standing balance comment: Dependent on RW and external support                            Communication Communication Communication: No apparent difficulties  Cognition Arousal: Alert Behavior During Therapy: Flat affect                           PT - Cognition Comments: A&Ox4 Following commands: Intact      Cueing Cueing Techniques: Verbal cues  Exercises      General Comments General comments (skin integrity, edema, etc.): NAD on room air; assisted pt with hygeine after he used teh bathroom      Pertinent  Vitals/Pain Pain Assessment Pain Assessment: Faces Faces Pain Scale: Hurts a little bit Pain Location: back soreness Pain Descriptors / Indicators: Grimacing Pain Intervention(s): Monitored during session    Home Living Family/patient expects to be discharged to:: Private residence Living Arrangements: Spouse/significant other;Children Available Help at Discharge: Family;Available 24 hours/day Type of Home: House Home Access: Stairs to enter Entrance Stairs-Rails: None Entrance Stairs-Number of Steps: 1   Home Layout: One level        Prior Function            PT Goals (current goals can now be found in the care plan  section) Acute Rehab PT Goals Patient Stated Goal: get well, return home PT Goal Formulation: With patient/family Time For Goal Achievement: 02/08/24 Potential to Achieve Goals: Good Progress towards PT goals: Goals met and updated - see care plan    Frequency    Min 2X/week      PT Plan      Co-evaluation              AM-PAC PT 6 Clicks Mobility   Outcome Measure  Help needed turning from your back to your side while in a flat bed without using bedrails?: None Help needed moving from lying on your back to sitting on the side of a flat bed without using bedrails?: A Little Help needed moving to and from a bed to a chair (including a wheelchair)?: A Little Help needed standing up from a chair using your arms (e.g., wheelchair or bedside chair)?: A Little Help needed to walk in hospital room?: A Little Help needed climbing 3-5 steps with a railing? : Total 6 Click Score: 17    End of Session Equipment Utilized During Treatment: Gait belt Activity Tolerance: Patient tolerated treatment well Patient left: in bed;with call bell/phone within reach Nurse Communication: Mobility status PT Visit Diagnosis: Unsteadiness on feet (R26.81);Other abnormalities of gait and mobility (R26.89);Muscle weakness (generalized) (M62.81);History of falling (Z91.81);Difficulty in walking, not elsewhere classified (R26.2);Other symptoms and signs involving the nervous system (R29.898);Pain Pain - part of body:  (Back)     Time: 8666-8592 PT Time Calculation (min) (ACUTE ONLY): 34 min  Charges:    $Gait Training: 8-22 mins $Therapeutic Activity: 8-22 mins PT General Charges $$ ACUTE PT VISIT: 1 Visit                     Silvano Currier, PT  Acute Rehabilitation Services Office [phone] Secure Chat welcomed    Silvano VEAR Currier 01/25/2024, 7:26 PM

## 2024-01-25 NOTE — Progress Notes (Signed)
Patient placed himself on home CPAP for the night.  

## 2024-01-25 NOTE — Progress Notes (Signed)
 [facility] Progress Note   Assessment/ Plan:   #AKI on CKD4 CRRT d/c 1/8. Cr slightly risen to 3.1 this morning from 3 yesterday. Renal US  unremarkable.  - continue Torsemide  to 10mg  daily - Monitor I/O - Avoid nephrotoxic agents - Renal diet   #Hyperkalemia K 5.6 this morning. Urine studies (potassium creatinine ratio) suggest extra-renal cause of hyperkalemia - continue Lokelma  10g daily, will give additional dose today - Will continue prophylactic heparin , but could be contributing due to its effect in lowering aldosterone concentrations   Resolved: septic shock, acute metabolic encephalopathy, severe metabolic acidosis Per primary: HTN, T2DM, BPH  Subjective:   NAEON. Feeling ok this morning   Objective:   BP (!) 126/56   Pulse 70   Temp 98.7 F (37.1 C)   Resp 18   Ht 5' 11 (1.803 m)   Wt 121 kg   SpO2 97%   BMI 37.21 kg/m   Intake/Output Summary (Last 24 hours) at 01/25/2024 9157 Last data filed at 01/25/2024 0600 Gross per 24 hour  Intake 180 ml  Output 2000 ml  Net -1820 ml   Weight change:   Physical Exam: Gen:NAD, resting comfortably CVS:RRR, no mrg Resp:breathing comfortably on RA Ext:no swelling BLE  Imaging: US  RENAL Result Date: 01/24/2024 CLINICAL DATA:  Acute kidney injury on chronic renal disease. EXAM: RENAL / URINARY TRACT ULTRASOUND COMPLETE COMPARISON:  None Available. FINDINGS: Right Kidney: Renal measurements: 11.3 cm x 6.5 cm x 6.0 cm = volume: 229.28 mL. Echogenicity within normal limits. A 4.7 cm x 5.3 cm x 5.4 cm exophytic simple cyst is seen along the upper pole of the right kidney. No hydronephrosis is visualized. Left Kidney: Renal measurements: 11.3 cm x 7.5 cm x 4.8 cm = volume: 210.9 mL. There is diffusely increased echogenicity of the left kidney. 1.4 cm x 1.1 cm x 1.4 cm and 1.2 cm x 1.1 cm x 1.0 cm simple cysts are seen within the mid left kidney. No hydronephrosis is visualized. Bladder: Appears normal for degree  of bladder distention. Other: None. IMPRESSION: 1. Echogenic left kidney which may represent sequelae associated with medical renal disease. 2. Bilateral simple renal cysts. Electronically Signed   By: Suzen Dials M.D.   On: 01/24/2024 19:18    Labs: BMET Recent Labs  Lab 01/18/24 2337 01/20/24 0336 01/21/24 0048 01/22/24 0356 01/23/24 0013 01/24/24 0341 01/25/24 0353  NA 133* 133* 134* 136 134* 136 135  K 5.5* 5.1 5.7* 5.6* 6.1* 5.0 5.6*  CL 99 100 101 102 100 103 103  CO2 23 22 23 22 22  21* 21*  GLUCOSE 135* 128* 100* 101* 100* 102* 98  BUN 85* 80* 85* 86* 90* 91* 96*  CREATININE 2.93* 2.86* 2.78* 2.72* 2.92* 3.07* 3.12*  CALCIUM  8.7* 9.1 9.1 9.2 9.3 9.4 9.4  PHOS  --   --   --   --   --  5.1*  --    CBC No results for input(s): WBC, NEUTROABS, HGB, HCT, MCV, PLT in the last 168 hours.  Medications:     gabapentin   100 mg Oral QID   heparin   5,000 Units Subcutaneous Q8H   linaclotide   290 mcg Oral QAC breakfast   multivitamin  1 tablet Oral QHS   polyethylene glycol  17 g Oral Daily   senna  1 tablet Oral BID   sodium chloride  flush  10-40 mL Intracatheter Q12H   sodium zirconium cyclosilicate   10 g Oral Daily   tamsulosin   0.4  mg Oral Daily   torsemide   10 mg Oral Daily   Gae Bihl DO Family Medicine PGY2 01/25/2024, 8:42 AM

## 2024-01-26 DIAGNOSIS — R579 Shock, unspecified: Secondary | ICD-10-CM | POA: Diagnosis not present

## 2024-01-26 LAB — BASIC METABOLIC PANEL WITH GFR
Anion gap: 16 — ABNORMAL HIGH (ref 5–15)
BUN: 97 mg/dL — ABNORMAL HIGH (ref 8–23)
CO2: 16 mmol/L — ABNORMAL LOW (ref 22–32)
Calcium: 9.6 mg/dL (ref 8.9–10.3)
Chloride: 103 mmol/L (ref 98–111)
Creatinine, Ser: 3.27 mg/dL — ABNORMAL HIGH (ref 0.61–1.24)
GFR, Estimated: 20 mL/min — ABNORMAL LOW
Glucose, Bld: 124 mg/dL — ABNORMAL HIGH (ref 70–99)
Potassium: 5.3 mmol/L — ABNORMAL HIGH (ref 3.5–5.1)
Sodium: 134 mmol/L — ABNORMAL LOW (ref 135–145)

## 2024-01-26 LAB — RENAL FUNCTION PANEL
Albumin: 3.4 g/dL — ABNORMAL LOW (ref 3.5–5.0)
Anion gap: 13 (ref 5–15)
BUN: 95 mg/dL — ABNORMAL HIGH (ref 8–23)
CO2: 18 mmol/L — ABNORMAL LOW (ref 22–32)
Calcium: 9.7 mg/dL (ref 8.9–10.3)
Chloride: 105 mmol/L (ref 98–111)
Creatinine, Ser: 3.16 mg/dL — ABNORMAL HIGH (ref 0.61–1.24)
GFR, Estimated: 21 mL/min — ABNORMAL LOW
Glucose, Bld: 97 mg/dL (ref 70–99)
Phosphorus: 4.9 mg/dL — ABNORMAL HIGH (ref 2.5–4.6)
Potassium: 5.8 mmol/L — ABNORMAL HIGH (ref 3.5–5.1)
Sodium: 136 mmol/L (ref 135–145)

## 2024-01-26 MED ORDER — SODIUM ZIRCONIUM CYCLOSILICATE 10 G PO PACK
10.0000 g | PACK | Freq: Two times a day (BID) | ORAL | Status: DC
  Administered 2024-01-26 – 2024-01-27 (×2): 10 g via ORAL
  Filled 2024-01-26 (×2): qty 1

## 2024-01-26 MED ORDER — SODIUM BICARBONATE 650 MG PO TABS
650.0000 mg | ORAL_TABLET | Freq: Two times a day (BID) | ORAL | Status: DC
  Administered 2024-01-26 – 2024-01-30 (×9): 650 mg via ORAL
  Filled 2024-01-26 (×9): qty 1

## 2024-01-26 NOTE — Plan of Care (Signed)

## 2024-01-26 NOTE — TOC Progression Note (Signed)
 Transition of Care [facility]) - Progression Note    Patient Details  Name: Peter Bryan MRN: [id number] Date of Birth: [birth year]  [facility]) CM/SW Contact  Lendia Dais, [place] Phone Number: 01/26/2024, 3:51 PM  Clinical Narrative: CSW informed by MD and RN that the pt wanted to speak to the CSW about SNF.   CSW met with the pt at bedside and stated he and his family were not agreeable to [facility] after touring. CSW stated that they only facility left is [facility] who would be able to do a SCA (single contract agreement) due the facility not being contracted with the pt's insurance. Pt stated they would perfer to go home [facility] CSW encourage the pt to talk to PT/OT and stated they will.  CSW will continue to follow.                      Expected Discharge Plan and Services                                               Social Drivers of Health (SDOH) Interventions SDOH Screenings   Food Insecurity: No Food Insecurity (01/13/2024)  Housing: Low Risk (01/13/2024)  Transportation Needs: No Transportation Needs (01/13/2024)  Utilities: Not At Risk (01/13/2024)  Financial Resource Strain: Patient Declined (08/13/2023)   Received from [facility]  Physical Activity: Inactive (08/13/2023)   Received from [facility]  Social Connections: Socially Integrated (01/13/2024)  Stress: No Stress Concern Present (08/13/2023)   Received from [facility]  Tobacco Use: Low Risk (01/10/2024)    Readmission Risk Interventions    01/13/2024    1:16 PM  Readmission Risk Prevention Plan  Transportation Screening Complete  PCP or Specialist Appt within 5-7 Days Complete  Home Care Screening Complete  Medication Review (RN CM) Referral to Pharmacy

## 2024-01-26 NOTE — Progress Notes (Signed)
 " PROGRESS NOTE  Peter Bryan  DOB: 1960-03-25  PCP: Shlomo Castilla I, NP FMW:[id number]  DOA: 01/10/2024  LOS: 16 days  Hospital Day: 17  Subjective: Patient was seen and examined this morning. Propped up in bed.  Not in distress. Afebrile, hemodynamically stable, breathing on room air Labs this morning with potassium up again at 5.8 with creatinine up at 3.16  Brief narrative: Peter Bryan is a 64 y.o. male with PMH significant for obesity, OSA, DM2, HTN, CKD 4, recurrent hyperkalemia, multiple back surgeries, chronic pain syndrome 1/6-patient was brought to the ED with altered mental status, weakness.  At baseline, was able to ambulate with a Riddell/cane.  In the ED, patient was profoundly hypotensive, and hyperkalemia and severe metabolic acidosis. CT chest abdomen pelvis showed opacity in the left lower lobe possibly representing early pneumonia. Admitted to ICU for septic shock. Required pressors, CRRT 1/9-transferred to [facility] Pending placement  Assessment and plan: Septic shock POA - resolved Left lower lobe pneumonia Completed course of antibiotics. Currently breathing on room air   Acute metabolic encephalopathy, resolved Multifactorial in the setting of shock, infection, uremia Resolved, currently alert and oriented, at baseline   AKI on CKD4 Severe metabolic acidosis, Severe hyperkalemia Presumably secondary to shock as above.  Seen by nephrology.  Required CRRT 1/6-1/8.  Temporary HD catheter has been removed. Creatinine is stabilizing around 3.  Hyperkalemia persists. Diuretics adjustment per nephrology.   1/21, torsemide  was stopped and fluid restriction was relaxed to 1.8 L/day.  1 dose of Lokelma  was given 1/22, creatinine slightly up, potassium up again at 5.8.  Nephrology plan to increase Lokelma  to twice daily.  Also restarted sodium bicarb 650 mg twice daily. Recent Labs  Lab 01/22/24 0356 01/23/24 0013 01/24/24 0341 01/25/24 0353 01/26/24 0414   NA 136 134* 136 135 136  K 5.6* 6.1* 5.0 5.6* 5.8*  CL 102 100 103 103 105  CO2 22 22 21* 21* 18*  GLUCOSE 101* 100* 102* 98 97  BUN 86* 90* 91* 96* 95*  CREATININE 2.72* 2.92* 3.07* 3.12* 3.16*  CALCIUM  9.2 9.3 9.4 9.4 9.7  PHOS  --   --  5.1*  --  4.9*   Ambulatory dysfunction, acute  PT OT recommended SNF   HTN  Currently on torsemide  10 mg daily   DM 2, well controlled  HbA1c 5.4 on 01/12/2024  BPH Continue Flomax  0.4 mg daily   Constipation Currently on Senokot twice daily, Linzess  daily, MiraLAX  daily   Obesity 2 Body mass index is 36.65 kg/m. Patient has been advised to make an attempt to improve diet and exercise patterns to aid in weight loss.   Nutrition Status: Nutrition Problem: Increased nutrient needs Etiology: acute illness Signs/Symptoms: estimated needs Interventions: MVI, Ensure Enlive (each supplement provides 350kcal and 20 grams of protein), Refer to RD note for recommendations, Liberalize Diet   Mobility: Encourage ambulation  PT Orders: Active   PT Follow up Rec: Skilled Nursing-Short Term Rehab (<3 Hours/Day)01/25/2024 1900    Goals of care   Code Status: Full Code     DVT prophylaxis:  heparin  injection 5,000 Units Start: 01/11/24 0600 SCDs Start: 01/10/24 2209   Antimicrobials: None Fluid: None  Consultants: Nephrology Family Communication: Daughter-in-law at bedside  Status: Inpatient Level of care:  Med-Surg   Patient is from: Home Needs to continue in-hospital care: Continue to monitor creatinine and potassium. Anticipated d/c to: Hopefully SNF in 1 to 2 days.  Case management involved.  Diet:  Diet Order             Diet renal with fluid restriction Fluid restriction: 1800 mL Fluid; Room service appropriate? Yes; Fluid consistency: Thin  Diet effective now                   Scheduled Meds:  baclofen   10 mg Oral QHS   gabapentin   100 mg Oral QID   heparin   5,000 Units Subcutaneous Q8H   linaclotide    290 mcg Oral QAC breakfast   multivitamin  1 tablet Oral QHS   polyethylene glycol  17 g Oral Daily   senna  1 tablet Oral BID   sodium bicarbonate   650 mg Oral BID   sodium chloride  flush  10-40 mL Intracatheter Q12H   sodium zirconium cyclosilicate   10 g Oral BID   tamsulosin   0.4 mg Oral Daily    PRN meds: acetaminophen , HYDROcodone -acetaminophen , mouth rinse, sodium chloride  flush, zolpidem    Infusions:    Antimicrobials: Anti-infectives (From admission, onward)    Start     Dose/Rate Route Frequency Ordered Stop   01/11/24 1500  doxycycline  (VIBRAMYCIN ) 100 mg in sodium chloride  0.9 % 250 mL IVPB        100 mg 125 mL/hr over 120 Minutes Intravenous Every 12 hours 01/11/24 1427 01/16/24 0050   01/11/24 1400  cefTRIAXone  (ROCEPHIN ) 2 g in sodium chloride  0.9 % 100 mL IVPB        2 g 200 mL/hr over 30 Minutes Intravenous Every 24 hours 01/11/24 0952 01/15/24 1611   01/11/24 0800  vancomycin  (VANCOREADY) IVPB 1500 mg/300 mL  Status:  Discontinued        1,500 mg 150 mL/hr over 120 Minutes Intravenous Daily 01/10/24 2234 01/11/24 0950   01/11/24 0200  piperacillin -tazobactam (ZOSYN ) IVPB 3.375 g  Status:  Discontinued        3.375 g 100 mL/hr over 30 Minutes Intravenous Every 6 hours 01/10/24 2234 01/11/24 0952   01/10/24 1800  ceFEPIme  (MAXIPIME ) 2 g in sodium chloride  0.9 % 100 mL IVPB        2 g 200 mL/hr over 30 Minutes Intravenous  Once 01/10/24 1753 01/10/24 1847   01/10/24 1800  metroNIDAZOLE  (FLAGYL ) IVPB 500 mg        500 mg 100 mL/hr over 60 Minutes Intravenous  Once 01/10/24 1753 01/10/24 1919   01/10/24 1800  vancomycin  (VANCOCIN ) IVPB 1000 mg/200 mL premix        1,000 mg 200 mL/hr over 60 Minutes Intravenous  Once 01/10/24 1753 01/10/24 1952       Objective: Vitals:   01/26/24 0503 01/26/24 0850  BP: 131/61 135/67  Pulse: (!) 54 73  Resp: 18 19  Temp: 98.3 F (36.8 C) 98 F (36.7 C)  SpO2: 97% 98%    Intake/Output Summary (Last 24 hours) at  01/26/2024 1205 Last data filed at 01/26/2024 0545 Gross per 24 hour  Intake 240 ml  Output 300 ml  Net -60 ml   Filed Weights   01/18/24 0757 01/23/24 0500 01/26/24 0751  Weight: 133.5 kg 121 kg 119.2 kg   Weight change:  Body mass index is 36.65 kg/m.   Physical Exam: General exam: Pleasant, elderly Caucasian male.  Not in physical distress Skin: No rashes, lesions or ulcers. HEENT: Atraumatic, normocephalic, no obvious bleeding Lungs: Clear to auscultation bilaterally,  CVS: S1, S2, no murmur,   GI/Abd: Soft, nontender, nondistended, bowel sound present,   CNS: Alert, awake, oriented x  3 Psychiatry: Mood appropriate Extremities: Improving bilateral pedal edema, no calf tenderness,   Data Review: I have personally reviewed the laboratory data and studies available.  F/u labs ordered Unresulted Labs (From admission, onward)     Start     Ordered   01/26/24 1400  Basic metabolic panel  Once,   R       Question:  Specimen collection method  Answer:  Lab=Lab collect   01/26/24 0851   01/26/24 0500  Renal function panel  Daily,   R     Question:  Specimen collection method  Answer:  Lab=Lab collect   01/25/24 1139            Signed, Chapman Rota, MD [facility] 01/26/2024  "

## 2024-01-26 NOTE — Progress Notes (Signed)
 [facility] Progress Note   Assessment/ Plan:   #AKI on CKD4 Cr stable at 3.1. Torsemide  discontinued yesterday.  - restart sodium bicarb 650mg  BID - Monitor I/O - Caution nephrotoxic agents - Renal diet   #Hyperkalemia K 5.8 this morning  - increase Lokelma  to 10g BID   Resolved: septic shock, acute metabolic encephalopathy, severe metabolic acidosis Per primary: HTN, T2DM, BPH  Subjective:   NAEON. No concerns this morning   Objective:   BP 135/67 (BP Location: Left Arm)   Pulse 73   Temp 98 F (36.7 C)   Resp 19   Ht 5' 11 (1.803 m)   Wt 119.2 kg   SpO2 98%   BMI 36.65 kg/m   Intake/Output Summary (Last 24 hours) at 01/26/2024 0913 Last data filed at 01/26/2024 0545 Gross per 24 hour  Intake 240 ml  Output 300 ml  Net -60 ml   Weight change:   Physical Exam: General: well appearing, NAD Cardiovascular: RRR, no m/r/g Respiratory: normal work of breathing on RA, CTAB Abdomen: Normal bowel sounds, soft, non-tender Extremities: No swelling BLE  Imaging: US  RENAL Result Date: 01/24/2024 CLINICAL DATA:  Acute kidney injury on chronic renal disease. EXAM: RENAL / URINARY TRACT ULTRASOUND COMPLETE COMPARISON:  None Available. FINDINGS: Right Kidney: Renal measurements: 11.3 cm x 6.5 cm x 6.0 cm = volume: 229.28 mL. Echogenicity within normal limits. A 4.7 cm x 5.3 cm x 5.4 cm exophytic simple cyst is seen along the upper pole of the right kidney. No hydronephrosis is visualized. Left Kidney: Renal measurements: 11.3 cm x 7.5 cm x 4.8 cm = volume: 210.9 mL. There is diffusely increased echogenicity of the left kidney. 1.4 cm x 1.1 cm x 1.4 cm and 1.2 cm x 1.1 cm x 1.0 cm simple cysts are seen within the mid left kidney. No hydronephrosis is visualized. Bladder: Appears normal for degree of bladder distention. Other: None. IMPRESSION: 1. Echogenic left kidney which may represent sequelae associated with medical renal disease. 2. Bilateral simple renal cysts.  Electronically Signed   By: Suzen Dials M.D.   On: 01/24/2024 19:18    Labs: BMET Recent Labs  Lab 01/20/24 0336 01/21/24 0048 01/22/24 0356 01/23/24 0013 01/24/24 0341 01/25/24 0353 01/26/24 0414  NA 133* 134* 136 134* 136 135 136  K 5.1 5.7* 5.6* 6.1* 5.0 5.6* 5.8*  CL 100 101 102 100 103 103 105  CO2 22 23 22 22  21* 21* 18*  GLUCOSE 128* 100* 101* 100* 102* 98 97  BUN 80* 85* 86* 90* 91* 96* 95*  CREATININE 2.86* 2.78* 2.72* 2.92* 3.07* 3.12* 3.16*  CALCIUM  9.1 9.1 9.2 9.3 9.4 9.4 9.7  PHOS  --   --   --   --  5.1*  --  4.9*   CBC No results for input(s): WBC, NEUTROABS, HGB, HCT, MCV, PLT in the last 168 hours.  Medications:     baclofen   10 mg Oral QHS   gabapentin   100 mg Oral QID   heparin   5,000 Units Subcutaneous Q8H   linaclotide   290 mcg Oral QAC breakfast   multivitamin  1 tablet Oral QHS   polyethylene glycol  17 g Oral Daily   senna  1 tablet Oral BID   sodium bicarbonate   650 mg Oral BID   sodium chloride  flush  10-40 mL Intracatheter Q12H   sodium zirconium cyclosilicate   10 g Oral BID   tamsulosin   0.4 mg Oral Daily   Antia Rahal DO  Family Medicine PGY2 01/26/2024, 9:13 AM

## 2024-01-26 NOTE — Progress Notes (Signed)
 Patient to self administer CPAP at night using his equipment from home.

## 2024-01-27 DIAGNOSIS — R579 Shock, unspecified: Secondary | ICD-10-CM | POA: Diagnosis not present

## 2024-01-27 LAB — RENAL FUNCTION PANEL
Albumin: 3.3 g/dL — ABNORMAL LOW (ref 3.5–5.0)
Anion gap: 12 (ref 5–15)
BUN: 94 mg/dL — ABNORMAL HIGH (ref 8–23)
CO2: 19 mmol/L — ABNORMAL LOW (ref 22–32)
Calcium: 9.9 mg/dL (ref 8.9–10.3)
Chloride: 105 mmol/L (ref 98–111)
Creatinine, Ser: 2.98 mg/dL — ABNORMAL HIGH (ref 0.61–1.24)
GFR, Estimated: 23 mL/min — ABNORMAL LOW
Glucose, Bld: 100 mg/dL — ABNORMAL HIGH (ref 70–99)
Phosphorus: 5.3 mg/dL — ABNORMAL HIGH (ref 2.5–4.6)
Potassium: 5.4 mmol/L — ABNORMAL HIGH (ref 3.5–5.1)
Sodium: 136 mmol/L (ref 135–145)

## 2024-01-27 MED ORDER — SODIUM ZIRCONIUM CYCLOSILICATE 10 G PO PACK
10.0000 g | PACK | Freq: Three times a day (TID) | ORAL | Status: DC
  Administered 2024-01-27 – 2024-01-28 (×3): 10 g via ORAL
  Filled 2024-01-27 (×3): qty 1

## 2024-01-27 NOTE — Plan of Care (Signed)

## 2024-01-27 NOTE — Progress Notes (Signed)
 Physical Therapy Treatment Patient Details Name: Peter Bryan MRN: [id number] DOB: 06-Jul-1960 Today's Date: 01/27/2024   History of Present Illness 64 y.o. male admitted 01/10/24 with worsening weakness, AMS, SOB. Workup for septic shock, severe hyperkalemia, AKI on CKD IV requiring CRRT 1/6-1/8. PMH includes HTN, DM2, CKD IV, multiple back sxs, chronic pain syndrome.    PT Comments  Pt admitted with above diagnosis. Pt was able to ambulate longer distance with supervision with OT.  No LOB. Practiced steps with PT and supervision for this. Pt educated in HEP and gave handout as well.  Pt has progressed and should be able to d/c home with HHPT. Messaged MD and CM regarding pts concerns about his medical equipment.  Will continue to follow acutely.  Pt currently with functional limitations due to the deficits listed below (see PT Problem List). Pt will benefit from acute skilled PT to increase their independence and safety with mobility to allow discharge.       If plan is discharge home, recommend the following: A lot of help with walking and/or transfers;A lot of help with bathing/dressing/bathroom;Assistance with cooking/housework;Assist for transportation;Help with stairs or ramp for entrance   Can travel by private vehicle     No (Perhaps soon)  Equipment Recommendations  Other (comment) (shower chair - pt is 261 lbs)    Recommendations for Other Services       Precautions / Restrictions Precautions Precautions: Fall Recall of Precautions/Restrictions: Intact Required Braces or Orthoses:  (Much better able to walk with his shoes on; much less foot and ankle pain) Restrictions Weight Bearing Restrictions Per Provider Order: No     Mobility  Bed Mobility Overal bed mobility: Needs Assistance Bed Mobility: Supine to Sit, Sit to Supine     Supine to sit: Independent Sit to supine: Independent   General bed mobility comments: Sitting EOB upon PT arrival. Laid down to do exercises  with no assist.    Transfers Overall transfer level: Needs assistance Equipment used: Rolling [place] (2 wheels) Transfers: Sit to/from Stand Sit to Stand: Supervision           General transfer comment: No assist to power up with pt moving slowly and doing well with foot placement and hand placement without cuing.    Ambulation/Gait Ambulation/Gait assistance: Supervision Gait Distance (Feet): 65 Feet Assistive device: Rolling [place] (2 wheels) Gait Pattern/deviations: Step-through pattern, Decreased stride length, Decreased step length - right, Decreased step length - left, Decreased dorsiflexion - left, Antalgic, Trunk flexed, Wide base of support   Gait velocity interpretation: 1.31 - 2.62 ft/sec, indicative of limited community ambulator   General Gait Details: Pt ambulated with RW with supervision only.  NO LOB with min challenges.  PT ambulates slow pace.   Stairs Stairs: Yes Stairs assistance: Supervision Stair Management: Forwards, With [place], Step to pattern Number of Stairs: 1 General stair comments: Pt demonstrates good technique and didnt need cues ot assist with up and down step.  Pt practiced x 2 without dificulty.   Wheelchair Mobility     Tilt Bed    Modified Rankin (Stroke Patients Only)       Balance Overall balance assessment: Needs assistance Sitting-balance support: No upper extremity supported, Feet supported Sitting balance-Leahy Scale: Good Sitting balance - Comments: Close supervision EOB   Standing balance support: Reliant on assistive device for balance, Bilateral upper extremity supported, During functional activity Standing balance-Leahy Scale: Poor Standing balance comment: Dependent on RW and external support  Communication Communication Communication: No apparent difficulties  Cognition Arousal: Alert Behavior During Therapy: WFL for tasks assessed/performed   PT - Cognitive  impairments: Awareness, Attention, Initiation, Sequencing, Problem solving                       PT - Cognition Comments: A&Ox4 Following commands: Intact      Cueing Cueing Techniques: Verbal cues  Exercises Other Exercises Other Exercises: Access Code: [email or web address]  URL: [email or web address]  Date: 01/27/2024  Prepared by: Stephane    Exercises  - Supine Heel Slides  - 3 x daily - 7 x weekly - 2 sets - 10 reps - 5 hold  - Supine Hip Abduction  - 3 x daily - 7 x weekly - 2 sets - 10 reps - 5 hold  - Seated Long Arc Quad  - 3 x daily - 7 x weekly - 2 sets - 10 reps - 5 hold  - Standing Heel Raise  - 3 x daily - 7 x weekly - 2 sets - 10 reps - 5 hold  - Squat with Chair Support  - 3 x daily - 7 x weekly - 2 sets - 10 reps - 5 hold  - March in Place  - 3 x daily - 7 x weekly - 2 sets - 10 reps - 5 hold - Pt practiced 10 reps of each exercise on 1/23.  Returns demonstration with supervision.    General Comments General comments (skin integrity, edema, etc.): VSS      Pertinent Vitals/Pain Pain Assessment Pain Assessment: Faces Faces Pain Scale: Hurts a little bit Breathing: normal Pain Location: feet Pain Descriptors / Indicators: Grimacing Pain Intervention(s): Limited activity within patient's tolerance, Monitored during session, Repositioned    Home Living                          Prior Function            PT Goals (current goals can now be found in the care plan section) Acute Rehab PT Goals Patient Stated Goal: get well, return home Progress towards PT goals: Progressing toward goals    Frequency    Min 2X/week      PT Plan      Co-evaluation              AM-PAC PT 6 Clicks Mobility   Outcome Measure  Help needed turning from your back to your side while in a flat bed without using bedrails?: None Help needed moving from lying on your back to sitting on the side of a flat bed without using bedrails?: A Little Help needed moving  to and from a bed to a chair (including a wheelchair)?: A Little Help needed standing up from a chair using your arms (e.g., wheelchair or bedside chair)?: A Little Help needed to walk in hospital room?: A Little Help needed climbing 3-5 steps with a railing? : A Little 6 Click Score: 19    End of Session Equipment Utilized During Treatment: Gait belt Activity Tolerance: Patient tolerated treatment well Patient left: in bed;with call bell/phone within reach (sittting EOB) Nurse Communication: Mobility status PT Visit Diagnosis: Unsteadiness on feet (R26.81);Other abnormalities of gait and mobility (R26.89);Muscle weakness (generalized) (M62.81);History of falling (Z91.81);Difficulty in walking, not elsewhere classified (R26.2);Other symptoms and signs involving the nervous system (R29.898);Pain Pain - part of body:  (Back)     Time: 0930-1010 PT Time Calculation (min) (ACUTE  ONLY): 40 min  Charges:    $Gait Training: 23-37 mins $Therapeutic Exercise: 8-22 mins PT General Charges $$ ACUTE PT VISIT: 1 Visit                     Toshia Larkin M,PT Acute Rehab Services [phone]    Stephane JULIANNA Bevel 01/27/2024, 10:24 AM

## 2024-01-27 NOTE — Progress Notes (Signed)
 [facility] Progress Note   Assessment/ Plan:   1. AKI on CKD4  Creatinine improving this morning after discontinuation of torsemide .  Still with good urine output.  Continue sodium bicarb 650 mg twice daily.  Monitor I/O, caution nephrotoxic agents.  Continue renal diet. 2.  Hyperkalemia Elevated at 5.4 this morning.  Increase Lokelma  to 10 g 3 times daily.  Resolved: septic shock, acute metabolic encephalopathy, severe metabolic acidosis Per primary: HTN, T2DM, BPH  Subjective:   Walking around unit with PT this morning, no events overnight.   Objective:   BP 125/63 (BP Location: Left Arm)   Pulse (!) 53   Temp 97.7 F (36.5 C)   Resp 18   Ht 5' 11 (1.803 m)   Wt 119.1 kg   SpO2 97%   BMI 36.61 kg/m   Intake/Output Summary (Last 24 hours) at 01/27/2024 9191 Last data filed at 01/27/2024 9562 Gross per 24 hour  Intake 1300 ml  Output 2050 ml  Net -750 ml   Weight change:   Physical Exam: Gen: Well-appearing, no acute distress Resp: Breathing comfortably on room air Ext: No swelling bilateral lower extremities  Imaging: No results found.  Labs: BMET Recent Labs  Lab 01/22/24 0356 01/23/24 0013 01/24/24 0341 01/25/24 0353 01/26/24 0414 01/26/24 1322 01/27/24 0608  NA 136 134* 136 135 136 134* 136  K 5.6* 6.1* 5.0 5.6* 5.8* 5.3* 5.4*  CL 102 100 103 103 105 103 105  CO2 22 22 21* 21* 18* 16* 19*  GLUCOSE 101* 100* 102* 98 97 124* 100*  BUN 86* 90* 91* 96* 95* 97* 94*  CREATININE 2.72* 2.92* 3.07* 3.12* 3.16* 3.27* 2.98*  CALCIUM  9.2 9.3 9.4 9.4 9.7 9.6 9.9  PHOS  --   --  5.1*  --  4.9*  --  5.3*   CBC No results for input(s): WBC, NEUTROABS, HGB, HCT, MCV, PLT in the last 168 hours.  Medications:     baclofen   10 mg Oral QHS   gabapentin   100 mg Oral QID   heparin   5,000 Units Subcutaneous Q8H   linaclotide   290 mcg Oral QAC breakfast   multivitamin  1 tablet Oral QHS   polyethylene glycol  17 g Oral Daily   senna  1  tablet Oral BID   sodium bicarbonate   650 mg Oral BID   sodium chloride  flush  10-40 mL Intracatheter Q12H   sodium zirconium cyclosilicate   10 g Oral BID   tamsulosin   0.4 mg Oral Daily   Ayra Hodgdon DO 01/27/2024, 8:08 AM

## 2024-01-27 NOTE — Progress Notes (Signed)
 Occupational Therapy Treatment Patient Details Name: Peter Bryan MRN: [id number] DOB: Aug 18, 1960 Today's Date: 01/27/2024   History of present illness 64 y.o. male admitted 01/10/24 with worsening weakness, AMS, SOB. Workup for septic shock, severe hyperkalemia, AKI on CKD IV requiring CRRT 1/6-1/8. PMH includes HTN, DM2, CKD IV, multiple back sxs, chronic pain syndrome.   OT comments  Patient with good progress toward patient focused goals.  Min A for ADL completion with VS's for safety.  CGA for in room mobility/toileting.  Goals updated and discharge recommendation changed to [facility].        If plan is discharge home, recommend the following:  A little help with walking and/or transfers;A little help with bathing/dressing/bathroom;Assist for transportation   Equipment Recommendations       Recommendations for Other Services      Precautions / Restrictions Precautions Precautions: Fall Recall of Precautions/Restrictions: Intact Restrictions Weight Bearing Restrictions Per Provider Order: No       Mobility Bed Mobility               General bed mobility comments: Sitting EOB upon PT arrival    Transfers Overall transfer level: Needs assistance Equipment used: Rolling [place] (2 wheels) Transfers: Sit to/from Stand Sit to Stand: Supervision, Contact guard assist                 Balance Overall balance assessment: Needs assistance Sitting-balance support: No upper extremity supported, Feet supported Sitting balance-Leahy Scale: Good     Standing balance support: Reliant on assistive device for balance Standing balance-Leahy Scale: Poor                             ADL either performed or assessed with clinical judgement   ADL       Grooming: Set up;Sitting   Upper Body Bathing: Set up;Sitting   Lower Body Bathing: Minimal assistance;Sit to/from stand   Upper Body Dressing : Set up;Sitting   Lower Body Dressing: Minimal assistance;Sit  to/from stand   Toilet Transfer: Contact guard assist                  Extremity/Trunk Assessment Upper Extremity Assessment Upper Extremity Assessment: Overall WFL for tasks assessed            Vision Baseline Vision/History: 1 Wears glasses Patient Visual Report: No change from baseline     Perception Perception Perception: Not tested   Praxis Praxis Praxis: Not tested   Communication Communication Communication: No apparent difficulties   Cognition Arousal: Alert Behavior During Therapy: WFL for tasks assessed/performed Cognition: No apparent impairments                               Following commands: Intact        Cueing   Cueing Techniques: Verbal cues  Exercises      Shoulder Instructions       General Comments      Pertinent Vitals/ Pain       Pain Assessment Pain Assessment: No/denies pain Pain Intervention(s): Monitored during session                                                          Frequency  Min 2X/week        Progress Toward Goals  OT Goals(current goals can now be found in the care plan section)  Progress towards OT goals: Progressing toward goals  Acute Rehab OT Goals OT Goal Formulation: With patient/family Time For Goal Achievement: 02/09/24 Potential to Achieve Goals: Good  Plan      Co-evaluation                 AM-PAC OT 6 Clicks Daily Activity     Outcome Measure   Help from another person eating meals?: None Help from another person taking care of personal grooming?: None Help from another person toileting, which includes using toliet, bedpan, or urinal?: A Little Help from another person bathing (including washing, rinsing, drying)?: A Little Help from another person to put on and taking off regular upper body clothing?: A Little Help from another person to put on and taking off regular lower body clothing?: A Little 6 Click Score: 20    End of  Session Equipment Utilized During Treatment: Gait belt;Rolling [place] (2 wheels)  OT Visit Diagnosis: Unsteadiness on feet (R26.81)   Activity Tolerance Patient tolerated treatment well   Patient Left in bed;with call bell/phone within reach   Nurse Communication          Time: 9166-9142 OT Time Calculation (min): 24 min  Charges: OT General Charges $OT Visit: 1 Visit OT Treatments $Self Care/Home Management : 23-37 mins  01/27/2024  RP, OTR/L  Acute Rehabilitation Services  Office:  [phone]   Charlie JONETTA Halsted 01/27/2024, 9:03 AM

## 2024-01-27 NOTE — Progress Notes (Signed)
 Mobility Specialist: Progress Note   01/27/24 1500  Mobility  Activity Ambulated with assistance  Level of Assistance Standby assist, set-up cues, supervision of patient - no hands on  Assistive Device Front wheel [place]  Distance Ambulated (ft) 150 ft  Activity Response Tolerated well  Mobility Referral Yes  Mobility visit 1 Mobility  Mobility Specialist Start Time (ACUTE ONLY) 1410  Mobility Specialist Stop Time (ACUTE ONLY) 1420  Mobility Specialist Time Calculation (min) (ACUTE ONLY) 10 min    Pt received on EOB, agreeable to mobility session. SV throughout. 1x standing rest break d/t fatigue, otherwise no complaints. SpO2 WFL on RA. Returned to room. Left on EOB with all needs met, call bell in reach.   Ileana Lute Mobility Specialist Please contact via SecureChat or Rehab office at [phone]

## 2024-01-27 NOTE — Care Management Important Message (Signed)
 Important Message  Patient Details  Name: Peter Bryan MRN: [id number] Date of Birth: May 12, 1960   Important Message Given:  Yes - Medicare IM     [name] 01/27/2024, 3:00 PM

## 2024-01-27 NOTE — Progress Notes (Signed)
 " PROGRESS NOTE  Peter Bryan  DOB: [birth year]  PCP: Shlomo Castilla I, NP FMW:[id number]  DOA: 01/10/2024  LOS: 17 days  Hospital Day: 18  Subjective: Patient was seen and examined this morning. Sitting up at the edge of the bed.  Not in distress. Afebrile, hemodynamically stable, breathing on room air. Labs this morning with potassium still elevated at 5.4, creatinine improved to 2.98  Brief narrative: Peter Bryan is a 64 y.o. male with PMH significant for obesity, OSA, DM2, HTN, CKD 4, recurrent hyperkalemia, multiple back surgeries, chronic pain syndrome 1/6-patient was brought to the ED with altered mental status, weakness.  At baseline, was able to ambulate with a Tolen/cane.  In the ED, patient was profoundly hypotensive, and hyperkalemia and severe metabolic acidosis. CT chest abdomen pelvis showed opacity in the left lower lobe possibly representing early pneumonia. Admitted to ICU for septic shock. Required pressors, CRRT 1/9-transferred to [facility]  Assessment and plan: Septic shock POA - resolved Left lower lobe pneumonia Completed course of antibiotics. Currently breathing on room air   Acute metabolic encephalopathy, resolved Multifactorial in the setting of shock, infection, uremia Resolved, currently alert and oriented, at baseline   AKI on CKD4 Severe metabolic acidosis, Severe hyperkalemia Presumably secondary to shock as above.  Seen by nephrology.  Required CRRT 1/6-1/8.  Temporary HD catheter has been removed. Creatinine is stabilizing around 3.  Hyperkalemia persists. Diuretics adjustment per nephrology.   1/21, torsemide  was stopped and fluid restriction was relaxed to 1.8 L/day.  1 dose of Lokelma  was given 1/22, creatinine slightly up, potassium up again at 5.3.  Lokelma  twice daily given.  Also restarted sodium bicarb 650 mg twice daily. 1/23, potassium elevated again at 5.4.  Creatinine better at 2.98.  Noted plan for nephrology to increase Lokelma   to 3 times daily. Recent Labs  Lab 01/24/24 0341 01/25/24 0353 01/26/24 0414 01/26/24 1322 01/27/24 0608  NA 136 135 136 134* 136  K 5.0 5.6* 5.8* 5.3* 5.4*  CL 103 103 105 103 105  CO2 21* 21* 18* 16* 19*  GLUCOSE 102* 98 97 124* 100*  BUN 91* 96* 95* 97* 94*  CREATININE 3.07* 3.12* 3.16* 3.27* 2.98*  CALCIUM  9.4 9.4 9.7 9.6 9.9  PHOS 5.1*  --  4.9*  --  5.3*   Ambulatory dysfunction, acute  PT OT recommended SNF   HTN  Not on antihypertensives currently   DM 2, well controlled  HbA1c 5.4 on 01/12/2024  BPH Continue Flomax  0.4 mg daily   Constipation Currently on Senokot twice daily, Linzess  daily, MiraLAX  daily   Obesity 2 Body mass index is 36.61 kg/m. Patient has been advised to make an attempt to improve diet and exercise patterns to aid in weight loss.   Nutrition Status: Nutrition Problem: Increased nutrient needs Etiology: acute illness Signs/Symptoms: estimated needs Interventions: MVI, Ensure Enlive (each supplement provides 350kcal and 20 grams of protein), Refer to RD note for recommendations, Liberalize Diet   Mobility: Encourage ambulation  PT Orders: Active   PT Follow up Rec: Home Health Pt1/23/2026 1000    Goals of care   Code Status: Full Code     DVT prophylaxis:  heparin  injection 5,000 Units Start: 01/11/24 0600 SCDs Start: 01/10/24 2209   Antimicrobials: None Fluid: None  Consultants: Nephrology Family Communication: Daughter-in-law at bedside  Status: Inpatient Level of care:  Med-Surg   Patient is from: Home Needs to continue in-hospital care: Continue to monitor creatinine and potassium. Anticipated d/c to: Hopefully  home with home health in next 1 to 2 days.     Diet:  Diet Order             Diet renal with fluid restriction Fluid restriction: 1800 mL Fluid; Room service appropriate? Yes; Fluid consistency: Thin  Diet effective now                   Scheduled Meds:  baclofen   10 mg Oral QHS   gabapentin    100 mg Oral QID   heparin   5,000 Units Subcutaneous Q8H   linaclotide   290 mcg Oral QAC breakfast   multivitamin  1 tablet Oral QHS   polyethylene glycol  17 g Oral Daily   senna  1 tablet Oral BID   sodium bicarbonate   650 mg Oral BID   sodium chloride  flush  10-40 mL Intracatheter Q12H   sodium zirconium cyclosilicate   10 g Oral TID   tamsulosin   0.4 mg Oral Daily    PRN meds: acetaminophen , HYDROcodone -acetaminophen , mouth rinse, sodium chloride  flush, zolpidem    Infusions:    Antimicrobials: Anti-infectives (From admission, onward)    Start     Dose/Rate Route Frequency Ordered Stop   01/11/24 1500  doxycycline  (VIBRAMYCIN ) 100 mg in sodium chloride  0.9 % 250 mL IVPB        100 mg 125 mL/hr over 120 Minutes Intravenous Every 12 hours 01/11/24 1427 01/16/24 0050   01/11/24 1400  cefTRIAXone  (ROCEPHIN ) 2 g in sodium chloride  0.9 % 100 mL IVPB        2 g 200 mL/hr over 30 Minutes Intravenous Every 24 hours 01/11/24 0952 01/15/24 1611   01/11/24 0800  vancomycin  (VANCOREADY) IVPB 1500 mg/300 mL  Status:  Discontinued        1,500 mg 150 mL/hr over 120 Minutes Intravenous Daily 01/10/24 2234 01/11/24 0950   01/11/24 0200  piperacillin -tazobactam (ZOSYN ) IVPB 3.375 g  Status:  Discontinued        3.375 g 100 mL/hr over 30 Minutes Intravenous Every 6 hours 01/10/24 2234 01/11/24 0952   01/10/24 1800  ceFEPIme  (MAXIPIME ) 2 g in sodium chloride  0.9 % 100 mL IVPB        2 g 200 mL/hr over 30 Minutes Intravenous  Once 01/10/24 1753 01/10/24 1847   01/10/24 1800  metroNIDAZOLE  (FLAGYL ) IVPB 500 mg        500 mg 100 mL/hr over 60 Minutes Intravenous  Once 01/10/24 1753 01/10/24 1919   01/10/24 1800  vancomycin  (VANCOCIN ) IVPB 1000 mg/200 mL premix        1,000 mg 200 mL/hr over 60 Minutes Intravenous  Once 01/10/24 1753 01/10/24 1952       Objective: Vitals:   01/27/24 0435 01/27/24 0900  BP: 125/63 124/68  Pulse: (!) 53 78  Resp: 18 19  Temp: 97.7 F (36.5 C) 98.4 F  (36.9 C)  SpO2: 97% 97%    Intake/Output Summary (Last 24 hours) at 01/27/2024 1202 Last data filed at 01/27/2024 0437 Gross per 24 hour  Intake 500 ml  Output 1150 ml  Net -650 ml   Filed Weights   01/23/24 0500 01/26/24 0751 01/27/24 0715  Weight: 121 kg 119.2 kg 119.1 kg   Weight change:  Body mass index is 36.61 kg/m.   Physical Exam: General exam: Pleasant, elderly Caucasian male.  Not in physical distress Skin: No rashes, lesions or ulcers. HEENT: Atraumatic, normocephalic, no obvious bleeding Lungs: Clear to auscultation bilaterally,  CVS: S1, S2, no murmur,  GI/Abd: Soft, nontender, nondistended, bowel sound present,   CNS: Alert, awake, oriented x 3 Psychiatry: Mood appropriate Extremities: Improving bilateral pedal edema, no calf tenderness,   Data Review: I have personally reviewed the laboratory data and studies available.  F/u labs ordered Unresulted Labs (From admission, onward)     Start     Ordered   01/26/24 0500  Renal function panel  Daily,   R     Question:  Specimen collection method  Answer:  Lab=Lab collect   01/25/24 1139            Signed, Chapman Rota, MD [facility] 01/27/2024  "

## 2024-01-28 DIAGNOSIS — R579 Shock, unspecified: Secondary | ICD-10-CM | POA: Diagnosis not present

## 2024-01-28 LAB — RENAL FUNCTION PANEL
Albumin: 3.6 g/dL (ref 3.5–5.0)
Anion gap: 11 (ref 5–15)
BUN: 89 mg/dL — ABNORMAL HIGH (ref 8–23)
CO2: 19 mmol/L — ABNORMAL LOW (ref 22–32)
Calcium: 10.1 mg/dL (ref 8.9–10.3)
Chloride: 105 mmol/L (ref 98–111)
Creatinine, Ser: 2.77 mg/dL — ABNORMAL HIGH (ref 0.61–1.24)
GFR, Estimated: 25 mL/min — ABNORMAL LOW
Glucose, Bld: 96 mg/dL (ref 70–99)
Phosphorus: 4.8 mg/dL — ABNORMAL HIGH (ref 2.5–4.6)
Potassium: 4.8 mmol/L (ref 3.5–5.1)
Sodium: 135 mmol/L (ref 135–145)

## 2024-01-28 MED ORDER — SODIUM ZIRCONIUM CYCLOSILICATE 10 G PO PACK
10.0000 g | PACK | Freq: Two times a day (BID) | ORAL | Status: DC
  Administered 2024-01-28 – 2024-01-30 (×4): 10 g via ORAL
  Filled 2024-01-28 (×4): qty 1

## 2024-01-28 NOTE — Progress Notes (Signed)
 Physical Therapy Treatment Patient Details Name: Peter Bryan MRN: [id number] DOB: [birth year] Today's Date: 01/28/2024   History of Present Illness 64 y.o. male admitted 01/10/24 with worsening weakness, AMS, SOB. Workup for septic shock, severe hyperkalemia, AKI on CKD IV requiring CRRT 1/6-1/8. PMH includes HTN, DM2, CKD IV, multiple back sxs, chronic pain syndrome.    PT Comments  Session focused on progressing ambulation and functional high level balance tasks to promote safety with functional activity. Pt better tolerates ambulation with shoes donned, however, shoes do not provide sufficient ankle support and ankle/foot is noted to fall into valgus position when weight bearing, likely contributing to instability. Static balance tasks within narrow BOS completed with pt demonstrating increased difficulty with eyes closed and in tandem positions.  Pt tolerated functional reaching tasks well with light support from rolling Suchy. Pt would benefit from continued PT services focused on progressing gait, balance, and pain management to promote improved tolerance and safety during functional mobility.   If plan is discharge home, recommend the following: A little help with walking and/or transfers;A little help with bathing/dressing/bathroom;Assistance with cooking/housework;Assist for transportation;Help with stairs or ramp for entrance;Supervision due to cognitive status;Direct supervision/assist for medications management;Direct supervision/assist for financial management   Can travel by private vehicle     Yes  Equipment Recommendations  Other (comment) Forensic Scientist chair, pt is 261lbs)    Recommendations for Other Services       Precautions / Restrictions Precautions Precautions: Fall Recall of Precautions/Restrictions: Intact Precaution/Restrictions Comments: Less foot and leg pain with shoes on while ambulating. Required Braces or Orthoses:  (Much better able to walk with his shoes on; much  less foot and ankle pain) Restrictions Weight Bearing Restrictions Per Provider Order: No     Mobility  Bed Mobility               General bed mobility comments: Sitting EOB upon PT arrival    Transfers Overall transfer level: Needs assistance Equipment used: Rolling Majer (2 wheels) Transfers: Sit to/from Stand Sit to Stand: Supervision, Contact guard assist           General transfer comment: Pt unsteady upon standing, repositions feet with [place] to improve balance. Good eccentric control upon returning to sit. Makes sure feet are positioned properly before initiating stance.    Ambulation/Gait Ambulation/Gait assistance: Supervision, Contact guard assist Gait Distance (Feet): 120 Feet (120'x2) Assistive device: Rolling [place]ett (2 wheels) Gait Pattern/deviations: Step-through pattern, Decreased stride length, Trunk flexed, Antalgic   Gait velocity interpretation: 1.31 - 2.62 ft/sec, indicative of limited community ambulator   General Gait Details: Pt reports pain in feet while ambulating. Good management of B[place]eu, cautious with movements to prevent LOB. Shoes worn not supportive, feet and ankle noted to fall into valgus in weight bearing.   Stairs             Wheelchair Mobility     Tilt Bed    Modified Rankin (Stroke Patients Only)       Balance Overall balance assessment: Needs assistance Sitting-balance support: No upper extremity supported, Feet supported Sitting balance-Leahy Scale: Good Sitting balance - Comments: No sitting balance concerns, sitting EOB upon arrival.   Standing balance support: Reliant on assistive device for balance, Bilateral upper extremity supported, During functional activity Standing balance-Leahy Scale: Poor Standing balance comment: Dependent on RW and external support. Unsteady due to pain and poor ankle control. Pt tolerates static stance with narrow BOS well while in romberg stance, eyes open. Pt unable to  maintain romberg stance with eyes closed. <10s tolerated in tandem stance, improves with single finger support on [place].     Tandem Stance - Right Leg: 10 Tandem Stance - Left Leg: 10 Rhomberg - Eyes Opened: 30 Rhomberg - Eyes Closed: 15 High level balance activites: Side stepping High Level Balance Comments: Pt does well side stepping with [place]. Cross body punches and lateral reaches x10 each completed in stance. Pt did not requrie UE support while completing cross body punches, single UE support during lateral reaches.            Communication Communication Communication: No apparent difficulties  Cognition Arousal: Alert Behavior During Therapy: WFL for tasks assessed/performed   PT - Cognitive impairments: Awareness, Attention, Sequencing, Problem solving                       PT - Cognition Comments: A&Ox4 Following commands: Intact      Cueing Cueing Techniques: Verbal cues, Tactile cues, Visual cues  Exercises      General Comments General comments (skin integrity, edema, etc.): VSS througout. No new skin abnormalities noted.      Pertinent Vitals/Pain Pain Assessment Pain Assessment: Faces Faces Pain Scale: Hurts a little bit Pain Location: Feet Pain Descriptors / Indicators: Aching, Sore Pain Intervention(s): Limited activity within patient's tolerance, Repositioned, Monitored during session, Other (comment) (Wore shoes during activity)    Home Living                          Prior Function            PT Goals (current goals can now be found in the care plan section) Acute Rehab PT Goals Patient Stated Goal: get well, return home PT Goal Formulation: With patient/family Time For Goal Achievement: 02/08/24 Potential to Achieve Goals: Good Progress towards PT goals: Progressing toward goals    Frequency    Min 2X/week      PT Plan      Co-evaluation              AM-PAC PT 6 Clicks Mobility   Outcome Measure   Help needed turning from your back to your side while in a flat bed without using bedrails?: None Help needed moving from lying on your back to sitting on the side of a flat bed without using bedrails?: A Little Help needed moving to and from a bed to a chair (including a wheelchair)?: A Little Help needed standing up from a chair using your arms (e.g., wheelchair or bedside chair)?: A Little Help needed to walk in hospital room?: A Little Help needed climbing 3-5 steps with a railing? : A Little 6 Click Score: 19    End of Session   Activity Tolerance: Patient tolerated treatment well Patient left: in bed;with call bell/phone within reach (Pt left seated EOB) Nurse Communication: Mobility status PT Visit Diagnosis: Unsteadiness on feet (R26.81);Muscle weakness (generalized) (M62.81);Pain Pain - Right/Left:  (Bilateral) Pain - part of body: Ankle and joints of foot     Time: 1420-1441 PT Time Calculation (min) (ACUTE ONLY): 21 min  Charges:    $Therapeutic Activity: 8-22 mins PT General Charges $$ ACUTE PT VISIT: 1 Visit                     Sabra Morel, PT, DPT  Acute Rehabilitation Services         Office: [phone]  Dajane Valli K Kenzlee Fishburn 01/28/2024, 3:13 PM
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
## 2024-01-28 NOTE — Progress Notes (Signed)
 Mobility Specialist Progress Note;   01/28/24 1046  Mobility  Activity Ambulated with assistance  Level of Assistance Standby assist, set-up cues, supervision of patient - no hands on  Assistive Device Front wheel [place]  Distance Ambulated (ft) 200 ft  Activity Response Tolerated well  Mobility Referral Yes  Mobility visit 1 Mobility  Mobility Specialist Start Time (ACUTE ONLY) 1046  Mobility Specialist Stop Time (ACUTE ONLY) 1056  Mobility Specialist Time Calculation (min) (ACUTE ONLY) 10 min   Pt sitting on EoB upon arrival, eager for mobility. Required no physical assistance during ambulation, SV for safety. No c/o when asked. Pt returned back to bed and left with all needs met, call bell in reach.   Lauraine Erm Mobility Specialist Please contact via SecureChat or [employer] [phone]

## 2024-01-28 NOTE — Progress Notes (Signed)
 Patient ID: Peter Bryan, male   DOB: Jun 27, 1960, 64 y.o.   MRN: [id number]  [facility] Progress Note   Assessment/ Plan:   1. Acute kidney Injury on chronic kidney disease stage IV (baseline creatinine around 3).  Transiently on CRRT earlier this admission that was discontinued after renal recovery.  Thereafter with rising creatinine for which we were reconsulted secondary to hemodynamic mechanisms; has improved with discontinuation of torsemide  and this euvolemic man.  Plans in place for outpatient follow-up in 2 weeks following discharge. 2.  Hyperkalemia: With history of chronic hyperkalemia for which she was previously on Veltassa.  Titrating Lokelma  while here in the hospital with potassium today at goal; will decrease dose to twice daily from 3 times daily now with potassium <5.  Continue renal diet. 3.  Hypertension: Blood pressures acceptable with intermittent elevations in times of pain/discomfort. 4.  Septic shock: Secondary to left lower lobe pneumonia, resolved.  Antibiotic therapy completed.  Nephrology will sign off at this time and remain available for questions or concerns (he has been set up for outpatient follow-up in 2 to 3 weeks).  Recommend discharging home on Lokelma  10 g twice daily as well as sodium bicarbonate  650 mg twice daily.  Subjective:   Denies any overnight complaints, inquires about the sequence that led to his hospitalization.   Objective:   BP 135/72 (BP Location: Left Arm)   Pulse 67   Temp 97.9 F (36.6 C) (Oral)   Resp 19   Ht 5' 11 (1.803 m)   Wt 119 kg   SpO2 98%   BMI 36.59 kg/m   Intake/Output Summary (Last 24 hours) at 01/28/2024 0932 Last data filed at 01/28/2024 9374 Gross per 24 hour  Intake 520 ml  Output 1460 ml  Net -940 ml   Weight change: -0.136 kg  Physical Exam: Gen: Comfortably sitting up on the edge of his bed, working on laptop CVS: Pulse regular rhythm, normal rate, normal S1 and S2 Resp: Clear to  auscultation, no rales/rhonchi Abd: Soft, obese, nontender, bowel sounds normal Ext: No lower extremity edema  Imaging: No results found.  Labs: BMET Recent Labs  Lab 01/23/24 0013 01/24/24 0341 01/25/24 0353 01/26/24 0414 01/26/24 1322 01/27/24 0608 01/28/24 0645  NA 134* 136 135 136 134* 136 135  K 6.1* 5.0 5.6* 5.8* 5.3* 5.4* 4.8  CL 100 103 103 105 103 105 105  CO2 22 21* 21* 18* 16* 19* 19*  GLUCOSE 100* 102* 98 97 124* 100* 96  BUN 90* 91* 96* 95* 97* 94* 89*  CREATININE 2.92* 3.07* 3.12* 3.16* 3.27* 2.98* 2.77*  CALCIUM  9.3 9.4 9.4 9.7 9.6 9.9 10.1  PHOS  --  5.1*  --  4.9*  --  5.3* 4.8*   CBC No results for input(s): WBC, NEUTROABS, HGB, HCT, MCV, PLT in the last 168 hours.  Medications:     baclofen   10 mg Oral QHS   gabapentin   100 mg Oral QID   heparin   5,000 Units Subcutaneous Q8H   linaclotide   290 mcg Oral QAC breakfast   multivitamin  1 tablet Oral QHS   polyethylene glycol  17 g Oral Daily   senna  1 tablet Oral BID   sodium bicarbonate   650 mg Oral BID   sodium chloride  flush  10-40 mL Intracatheter Q12H   sodium zirconium cyclosilicate   10 g Oral TID   tamsulosin   0.4 mg Oral Daily    Peter Blanch, MD 01/28/2024, 9:32 AM

## 2024-01-28 NOTE — Plan of Care (Signed)

## 2024-01-29 DIAGNOSIS — R579 Shock, unspecified: Secondary | ICD-10-CM | POA: Diagnosis not present

## 2024-01-29 LAB — RENAL FUNCTION PANEL
Albumin: 3.6 g/dL (ref 3.5–5.0)
Anion gap: 12 (ref 5–15)
BUN: 86 mg/dL — ABNORMAL HIGH (ref 8–23)
CO2: 20 mmol/L — ABNORMAL LOW (ref 22–32)
Calcium: 10.2 mg/dL (ref 8.9–10.3)
Chloride: 106 mmol/L (ref 98–111)
Creatinine, Ser: 2.66 mg/dL — ABNORMAL HIGH (ref 0.61–1.24)
GFR, Estimated: 26 mL/min — ABNORMAL LOW
Glucose, Bld: 90 mg/dL (ref 70–99)
Phosphorus: 4.8 mg/dL — ABNORMAL HIGH (ref 2.5–4.6)
Potassium: 5.2 mmol/L — ABNORMAL HIGH (ref 3.5–5.1)
Sodium: 137 mmol/L (ref 135–145)

## 2024-01-29 NOTE — Progress Notes (Signed)
 " PROGRESS NOTE  Peter Bryan  DOB: 13-Nov-1960  PCP: Shlomo Castilla I, NP FMW:[id number]  DOA: 01/10/2024  LOS: 19 days  Hospital Day: 20  Subjective: Patient was seen and examined this morning. Lying down in bed.  Not in distress.  No new symptoms. Family not at bedside Afebrile, hemodynamically stable Labs this morning with potassium 5.2, creatinine 2.66   Brief narrative: Peter Bryan is a 64 y.o. male with PMH significant for obesity, OSA, DM2, HTN, CKD 4, recurrent hyperkalemia, multiple back surgeries, chronic pain syndrome 1/6-patient was brought to the ED with altered mental status, weakness.  At baseline, was able to ambulate with a [place]/cane.  In the ED, patient was profoundly hypotensive, and hyperkalemia and severe metabolic acidosis. CT chest abdomen pelvis showed opacity in the left lower lobe possibly representing early pneumonia. Admitted to ICU for septic shock. Required pressors, CRRT 1/9-transferred to [facility]  Assessment and plan: Septic shock POA - resolved Left lower lobe pneumonia Completed course of antibiotics. Currently breathing on room air   Acute metabolic encephalopathy, resolved Multifactorial in the setting of shock, infection, uremia Resolved, currently alert and oriented, at baseline   AKI on CKD4 Severe metabolic acidosis, Severe hyperkalemia Presumably secondary to shock as above.  Seen by nephrology.  Required CRRT 1/6-1/8.  Temporary HD catheter has been removed. Creatinine is stabilizing around 3.   1/21, torsemide  was stopped and fluid restriction was relaxed to 1.8 L/day.  On sodium bicarb 650 mg twice daily for metabolic acidosis For hyperkalemia currently on Lokelma  twice daily per nephrology recommendation.  Potassium trend as below.   1/25, potassium up at 5.2 today.  For today, will continue Lokelma  twice daily. Continue to monitor  Recent Labs  Lab 01/24/24 0341 01/25/24 0353 01/26/24 0414 01/26/24 1322 01/27/24 0608  01/28/24 0645 01/29/24 0637  NA 136   < > 136 134* 136 135 137  K 5.0   < > 5.8* 5.3* 5.4* 4.8 5.2*  CL 103   < > 105 103 105 105 106  CO2 21*   < > 18* 16* 19* 19* 20*  GLUCOSE 102*   < > 97 124* 100* 96 90  BUN 91*   < > 95* 97* 94* 89* 86*  CREATININE 3.07*   < > 3.16* 3.27* 2.98* 2.77* 2.66*  CALCIUM  9.4   < > 9.7 9.6 9.9 10.1 10.2  PHOS 5.1*  --  4.9*  --  5.3* 4.8* 4.8*   < > = values in this interval not displayed.   Ambulatory dysfunction, acute  PT OT recommended SNF   HTN  Not on antihypertensives currently   DM 2, well controlled  HbA1c 5.4 on 01/12/2024  BPH Continue Flomax  0.4 mg daily   Constipation Currently on Senokot twice daily, Linzess  daily, MiraLAX  daily   Obesity 2 Body mass index is 36.59 kg/m. Patient has been advised to make an attempt to improve diet and exercise patterns to aid in weight loss.   Nutrition Status: Nutrition Problem: Increased nutrient needs Etiology: acute illness Signs/Symptoms: estimated needs Interventions: MVI, Ensure Enlive (each supplement provides 350kcal and 20 grams of protein), Refer to RD note for recommendations, Liberalize Diet   Mobility: Encourage ambulation  PT Orders: Active   PT Follow up Rec: Home Health Pt1/24/2026 1511    Goals of care   Code Status: Full Code     DVT prophylaxis:  heparin  injection 5,000 Units Start: 01/11/24 0600 SCDs Start: 01/10/24 2209   Antimicrobials: None  Fluid: None  Consultants: Nephrology Family Communication: Family not at bedside  Status: Inpatient Level of care:  Med-Surg   Patient is from: Home Needs to continue in-hospital care: Continue to monitor creatinine and potassium. Anticipated d/c to: Hopefully home with home health in next 1 to 2 days..      Diet:  Diet Order             Diet renal with fluid restriction Fluid restriction: 1800 mL Fluid; Room service appropriate? Yes; Fluid consistency: Thin  Diet effective now                    Scheduled Meds:  baclofen   10 mg Oral QHS   gabapentin   100 mg Oral QID   heparin   5,000 Units Subcutaneous Q8H   linaclotide   290 mcg Oral QAC breakfast   multivitamin  1 tablet Oral QHS   polyethylene glycol  17 g Oral Daily   senna  1 tablet Oral BID   sodium bicarbonate   650 mg Oral BID   sodium chloride  flush  10-40 mL Intracatheter Q12H   sodium zirconium cyclosilicate   10 g Oral BID   tamsulosin   0.4 mg Oral Daily    PRN meds: acetaminophen , HYDROcodone -acetaminophen , mouth rinse, sodium chloride  flush, zolpidem    Infusions:    Antimicrobials: Anti-infectives (From admission, onward)    Start     Dose/Rate Route Frequency Ordered Stop   01/11/24 1500  doxycycline  (VIBRAMYCIN ) 100 mg in sodium chloride  0.9 % 250 mL IVPB        100 mg 125 mL/hr over 120 Minutes Intravenous Every 12 hours 01/11/24 1427 01/16/24 0050   01/11/24 1400  cefTRIAXone  (ROCEPHIN ) 2 g in sodium chloride  0.9 % 100 mL IVPB        2 g 200 mL/hr over 30 Minutes Intravenous Every 24 hours 01/11/24 0952 01/15/24 1611   01/11/24 0800  vancomycin  (VANCOREADY) IVPB 1500 mg/300 mL  Status:  Discontinued        1,500 mg 150 mL/hr over 120 Minutes Intravenous Daily 01/10/24 2234 01/11/24 0950   01/11/24 0200  piperacillin -tazobactam (ZOSYN ) IVPB 3.375 g  Status:  Discontinued        3.375 g 100 mL/hr over 30 Minutes Intravenous Every 6 hours 01/10/24 2234 01/11/24 0952   01/10/24 1800  ceFEPIme  (MAXIPIME ) 2 g in sodium chloride  0.9 % 100 mL IVPB        2 g 200 mL/hr over 30 Minutes Intravenous  Once 01/10/24 1753 01/10/24 1847   01/10/24 1800  metroNIDAZOLE  (FLAGYL ) IVPB 500 mg        500 mg 100 mL/hr over 60 Minutes Intravenous  Once 01/10/24 1753 01/10/24 1919   01/10/24 1800  vancomycin  (VANCOCIN ) IVPB 1000 mg/200 mL premix        1,000 mg 200 mL/hr over 60 Minutes Intravenous  Once 01/10/24 1753 01/10/24 1952       Objective: Vitals:   01/29/24 0625 01/29/24 0900  BP: 133/64 135/60   Pulse: (!) 57 70  Resp: 17 19  Temp: 97.6 F (36.4 C) 98.1 F (36.7 C)  SpO2: 98%     Intake/Output Summary (Last 24 hours) at 01/29/2024 1048 Last data filed at 01/29/2024 0600 Gross per 24 hour  Intake 600 ml  Output 500 ml  Net 100 ml   Filed Weights   01/26/24 0751 01/27/24 0715 01/28/24 0500  Weight: 119.2 kg 119.1 kg 119 kg   Weight change:  Body mass index is 36.59  kg/m.   Physical Exam: General exam: Pleasant, elderly Caucasian male.  Not in physical distress.  Skin: No rashes, lesions or ulcers. HEENT: Atraumatic, normocephalic, no obvious bleeding Lungs: Clear to auscultation bilaterally,  CVS: S1, S2, no murmur,   GI/Abd: Soft, nontender, nondistended, bowel sound present,   CNS: Alert, awake, oriented x 3 Psychiatry: Mood appropriate Extremities: Improving bilateral pedal edema, no calf tenderness,   Data Review: I have personally reviewed the laboratory data and studies available.  F/u labs ordered Unresulted Labs (From admission, onward)     Start     Ordered   01/30/24 0500  Basic metabolic panel with GFR  Tomorrow morning,   R       Question:  Specimen collection method  Answer:  Lab=Lab collect   01/29/24 0920   01/26/24 0500  Renal function panel  Daily,   R     Question:  Specimen collection method  Answer:  Lab=Lab collect   01/25/24 1139            Signed, Chapman Rota, MD [facility] 01/29/2024  "

## 2024-01-30 ENCOUNTER — Other Ambulatory Visit (HOSPITAL_COMMUNITY): Payer: Self-pay

## 2024-01-30 DIAGNOSIS — R579 Shock, unspecified: Secondary | ICD-10-CM | POA: Diagnosis not present

## 2024-01-30 LAB — RENAL FUNCTION PANEL
Albumin: 3.6 g/dL (ref 3.5–5.0)
Anion gap: 12 (ref 5–15)
BUN: 82 mg/dL — ABNORMAL HIGH (ref 8–23)
CO2: 19 mmol/L — ABNORMAL LOW (ref 22–32)
Calcium: 10.2 mg/dL (ref 8.9–10.3)
Chloride: 106 mmol/L (ref 98–111)
Creatinine, Ser: 2.8 mg/dL — ABNORMAL HIGH (ref 0.61–1.24)
GFR, Estimated: 24 mL/min — ABNORMAL LOW
Glucose, Bld: 91 mg/dL (ref 70–99)
Phosphorus: 4.2 mg/dL (ref 2.5–4.6)
Potassium: 5.1 mmol/L (ref 3.5–5.1)
Sodium: 136 mmol/L (ref 135–145)

## 2024-01-30 MED ORDER — POLYETHYLENE GLYCOL 3350 17 GM/SCOOP PO POWD
17.0000 g | Freq: Every day | ORAL | 0 refills | Status: AC | PRN
  Filled 2024-01-30: qty 238, 14d supply, fill #0

## 2024-01-30 MED ORDER — SODIUM ZIRCONIUM CYCLOSILICATE 10 G PO PACK
10.0000 g | PACK | Freq: Two times a day (BID) | ORAL | 0 refills | Status: AC
  Filled 2024-01-30: qty 30, 15d supply, fill #0

## 2024-01-30 MED ORDER — SODIUM BICARBONATE 650 MG PO TABS
650.0000 mg | ORAL_TABLET | Freq: Two times a day (BID) | ORAL | 0 refills | Status: AC
  Filled 2024-01-30: qty 60, 30d supply, fill #0

## 2024-01-30 NOTE — TOC Transition Note (Signed)
 Transition of Care [facility]) - Discharge Note   Patient Details  Name: Peter Bryan MRN: [id number] Date of Birth: April 06, 1960  Transition of Care [facility]) CM/SW Contact:  Tom-Johnson, Harvest Muskrat, RN Phone Number: 01/30/2024, 11:47 AM   Clinical Narrative:     Patient is scheduled for discharge today.  Readmission Risk Assessment done. Home health info, hospital f/u and discharge instructions on AVS. Prescriptions sent to [facility] pharmacy and patient will receive meds prior discharge.  Shower chair recommended, patient declined, states he has a shower stool at home and will get a shower chair from the store if he needs to.  Wife, [name] will transport at discharge.  No further ICM needs noted.        Final next level of care: Home w Home Health Services Barriers to Discharge: Barriers Resolved   Patient Goals and CMS Choice Patient states their goals for this hospitalization and ongoing recovery are:: To return home CMS Medicare.gov Compare Post Acute Care list provided to:: Patient Choice offered to / list presented to : Patient      Discharge Placement                Patient to be transferred to facility by: Wife Name of family member notified: Stacy    Discharge Plan and Services Additional resources added to the After Visit Summary for                  DME Arranged: N/A (Declined) DME Agency: NA       HH Arranged: PT, OT, RN, Social Work [facility] Agency: [facility]     Representative spoke with at [facility] Agency: Darleene  Social Drivers of Health (SDOH) Interventions SDOH Screenings   Food Insecurity: No Food Insecurity (01/13/2024)  Housing: Low Risk (01/13/2024)  Transportation Needs: No Transportation Needs (01/13/2024)  Utilities: Not At Risk (01/13/2024)  Financial Resource Strain: Patient Declined (08/13/2023)   Received from [facility]  Physical Activity: Inactive (08/13/2023)   Received from [facility]  Social Connections: Socially Integrated  (01/13/2024)  Stress: No Stress Concern Present (08/13/2023)   Received from [facility]  Tobacco Use: Low Risk (01/10/2024)     Readmission Risk Interventions    01/13/2024    1:16 PM  Readmission Risk Prevention Plan  Transportation Screening Complete  PCP or Specialist Appt within 5-7 Days Complete  Home Care Screening Complete  Medication Review (RN CM) Referral to Pharmacy

## 2024-01-30 NOTE — Progress Notes (Signed)
 Discharge instructions (including medications) discussed with and copy provided to patient/caregiver, verbalized understanding. PIV removed and patient dressed already. TOC medications to be retrieved at DC.

## 2024-01-30 NOTE — Discharge Summary (Signed)
 "  Physician Discharge Summary  Peter Bryan FMW:[id number] DOB: Apr 08, 1960 DOA: 01/10/2024  PCP: Shlomo Castilla I, NP  Admit date: 01/10/2024 Discharge date: 01/30/2024  Admitted from: Home Discharge disposition: Home with home health services  Recommendations at discharge:  Continue Lokelma  10 mg daily as recommended by nephrology For blood pressure control, olmesartan, HCTZ and hydralazine are on hold.  Continue to monitor blood pressure at home. Januvia on hold for now.  Continue to follow-up with nephrology.   Subjective: Patient was seen and examined this morning. Sitting up at the edge of the bed.  Not in distress. Afebrile, hemodynamically stable Labs this morning with potassium 5.1, creatinine 2.8  Brief narrative: Peter Bryan is a 64 y.o. male with PMH significant for obesity, OSA, DM2, HTN, CKD 4, recurrent hyperkalemia, multiple back surgeries, chronic pain syndrome 1/6-patient was brought to the ED with altered mental status, weakness.  At baseline, was able to ambulate with a [place]/cane.  In the ED, patient was profoundly hypotensive, and hyperkalemia and severe metabolic acidosis. CT chest abdomen pelvis showed opacity in the left lower lobe possibly representing early pneumonia. Admitted to ICU for septic shock. Required pressors, CRRT 1/9-transferred to [facility]  Hospital course: Septic shock POA - resolved Left lower lobe pneumonia Completed course of antibiotics. Currently breathing on room air   Acute metabolic encephalopathy, resolved Multifactorial in the setting of shock, infection, uremia Resolved, currently alert and oriented, at baseline   AKI on CKD4 Severe metabolic acidosis, Severe hyperkalemia Presumably secondary to shock as above.  Seen by nephrology.  Required CRRT 1/6-1/8.  Temporary HD catheter has been removed. Creatinine is stabilizing around 3.   1/21, torsemide  was stopped and fluid restriction was relaxed to 1.8 L/day.  For  metabolic acidosis, continued on sodium bicarb 650 mg twice daily for metabolic acidosis. For hyperkalemia currently on Lokelma  twice daily per nephrology recommendation.  Potassium trend showing improvement as below. Recent Labs  Lab 01/26/24 0414 01/26/24 1322 01/27/24 0608 01/28/24 0645 01/29/24 0637 01/30/24 0308  NA 136 134* 136 135 137 136  K 5.8* 5.3* 5.4* 4.8 5.2* 5.1  CL 105 103 105 105 106 106  CO2 18* 16* 19* 19* 20* 19*  GLUCOSE 97 124* 100* 96 90 91  BUN 95* 97* 94* 89* 86* 82*  CREATININE 3.16* 3.27* 2.98* 2.77* 2.66* 2.80*  CALCIUM  9.7 9.6 9.9 10.1 10.2 10.2  PHOS 4.9*  --  5.3* 4.8* 4.8* 4.2   Ambulatory dysfunction, acute  PT OT recommended SNF   HTN  Not on antihypertensives currently   DM 2, well controlled  HbA1c 5.4 on 01/12/2024  BPH Continue Flomax  0.4 mg daily   Constipation Currently on Senokot twice daily, Linzess  daily, MiraLAX  daily   Obesity 2 Body mass index is 36.59 kg/m. Patient has been advised to make an attempt to improve diet and exercise patterns to aid in weight loss.   Nutrition Status: Nutrition Problem: Increased nutrient needs Etiology: acute illness Signs/Symptoms: estimated needs Interventions: MVI, Ensure Enlive (each supplement provides 350kcal and 20 grams of protein), Refer to RD note for recommendations, Liberalize Diet   Impaired mobility  Seen by PT.  Home with recommended  PT Follow up Rec: Home Health Pt1/24/2026 1511   Goals of care   Code Status: Full Code   Diet:  Diet Order             Diet general           Diet renal with fluid  restriction Fluid restriction: 1800 mL Fluid; Room service appropriate? Yes; Fluid consistency: Thin  Diet effective now                   Nutritional status:  Body mass index is 36.59 kg/m.  Nutrition Problem: Increased nutrient needs Etiology: acute illness Signs/Symptoms: estimated needs  Wounds:  - Wound 01/24/24 1000 Pressure Injury Coccyx Stage 2 -   Partial thickness loss of dermis presenting as a shallow open injury with a red, pink wound bed without slough. (Active)  Date First Assessed/Time First Assessed: 01/24/24 1000   Primary Wound Type: Pressure Injury  Location: Coccyx  Staging: Stage 2 -  Partial thickness loss of dermis presenting as a shallow open injury with a red, pink wound bed without slough.    Assessments 01/24/2024  9:13 AM 01/29/2024  4:00 PM  Site / Wound Assessment Red;Pink;Painful Clean;Dry  Peri-wound Assessment Pink --  Treatment Cleansed --  Dressing Type Foam - Lift dressing to assess site every shift --     No associated orders.    Discharge Medications:   Allergies as of 01/30/2024       Reactions   Nsaids Other (See Comments)   Contraindication due to CKD 4         Medication List     STOP taking these medications    hydrALAZINE 25 MG tablet Commonly known as: APRESOLINE   HYDROcodone -acetaminophen  10-325 MG tablet Commonly known as: NORCO Replaced by: HYDROcodone -acetaminophen  5-325 MG tablet   Jardiance  10 MG Tabs tablet Generic drug: empagliflozin    MAGNESIUM PO   valsartan-hydrochlorothiazide  320-25 MG tablet Commonly known as: DIOVAN-HCT   Vitamin D  (Ergocalciferol ) 1.25 MG (50000 UNIT) Caps capsule Commonly known as: DRISDOL       TAKE these medications    acetaminophen  325 MG tablet Commonly known as: TYLENOL  Take 2 tablets (650 mg total) by mouth every 6 (six) hours as needed for fever or mild pain (pain score 1-3).   baclofen  10 MG tablet Commonly known as: LIORESAL  Take 10 mg by mouth at bedtime.   gabapentin  100 MG capsule Commonly known as: NEURONTIN  Take 1 capsule (100 mg total) by mouth 4 (four) times daily. What changed:  medication strength how much to take   HYDROcodone -acetaminophen  5-325 MG tablet Commonly known as: NORCO/VICODIN Take 1 tablet by mouth every 6 (six) hours as needed for severe pain (pain score 7-10). Replaces:  HYDROcodone -acetaminophen  10-325 MG tablet   Linzess  290 MCG Caps capsule Generic drug: linaclotide  Take 290 mcg by mouth daily.   multivitamin Tabs tablet Take 1 tablet by mouth at bedtime.   polyethylene glycol 17 g packet Commonly known as: MIRALAX  / GLYCOLAX  Take 17 g by mouth daily as needed.   QC TUMERIC COMPLEX PO Take 4,500 mg by mouth daily at 12 noon.   rosuvastatin 5 MG tablet Commonly known as: CRESTOR Take 5 mg by mouth daily.   senna 8.6 MG Tabs tablet Commonly known as: SENOKOT Take 1 tablet (8.6 mg total) by mouth 2 (two) times daily.   sodium bicarbonate  650 MG tablet Take 1 tablet (650 mg total) by mouth 2 (two) times daily. What changed: when to take this   sodium zirconium cyclosilicate  10 g Pack packet Commonly known as: LOKELMA  Take 10 g by mouth 2 (two) times daily.   tamsulosin  0.4 MG Caps capsule Commonly known as: FLOMAX  Take 1 capsule (0.4 mg total) by mouth daily. What changed: how much to take   testosterone  cypionate  200 MG/ML injection Commonly known as: DEPOTESTOSTERONE CYPIONATE Inject 200 mg into the muscle once a week.   thiamine  50 MG tablet Take 50 mg by mouth 2 (two) times daily.   Vitamin D  50 MCG (2000 UT) tablet Take 1 tablet (2,000 Units total) by mouth daily.   zolpidem  10 MG tablet Commonly known as: AMBIEN  Take 1 tablet (10 mg total) by mouth at bedtime as needed for sleep. What changed:  when to take this reasons to take this               Discharge Care Instructions  (From admission, onward)           Start     Ordered   01/30/24 0000  Discharge wound care:        01/30/24 1128             Follow ups:    Contact information for follow-up providers     Shlomo Castilla I, NP. Schedule an appointment as soon as possible for a visit in 2 week(s).   Specialty: Nurse Practitioner Why: for hospital follow-up Contact information: [address] [place] [address] [place]  [address] [phone]              Contact information for after-discharge care     Destination     [facility] .   Service: Skilled Nursing Contact information: [address] [place]  [address] [phone]                     Discharge Instructions:   Discharge Instructions     Call MD for:  difficulty breathing, headache or visual disturbances   Complete by: As directed    Call MD for:  extreme fatigue   Complete by: As directed    Call MD for:  hives   Complete by: As directed    Call MD for:  persistant dizziness or light-headedness   Complete by: As directed    Call MD for:  persistant nausea and vomiting   Complete by: As directed    Call MD for:  severe uncontrolled pain   Complete by: As directed    Call MD for:  temperature >100.4   Complete by: As directed    Diet general   Complete by: As directed    Discharge instructions   Complete by: As directed    Recommendations at discharge:   Continue Lokelma  10 mg daily as recommended by nephrology  For blood pressure control, olmesartan, HCTZ and hydralazine are on hold.  Continue to monitor blood pressure at home.  Januvia on hold for now.  Continue to follow-up with nephrology.  PDMP reviewed this encounter.   Opioid taper instructions: It is important to wean off of your opioid medication as soon as possible. If you do not need pain medication after your surgery it is ok to stop day one. Opioids include: Codeine, Hydrocodone (Norco, Vicodin), Oxycodone (Percocet, oxycontin ) and hydromorphone  amongst others.  Long term and even short term use of opiods can cause: Increased pain response Dependence Constipation Depression Respiratory depression And more.  Withdrawal symptoms can include Flu like symptoms Nausea, vomiting And more Techniques to manage these symptoms Hydrate well Eat regular healthy meals Stay active Use relaxation techniques(deep breathing,  meditating, yoga) Do Not substitute Alcohol to help with tapering If you have been on opioids for less than two weeks and do not have pain than it is ok to stop all together.  Plan to wean off of opioids This plan should start within one week post op of your joint replacement. Maintain the same interval or time between taking each dose and first decrease the dose.  Cut the total daily intake of opioids by one tablet each day Next start to increase the time between doses. The last dose that should be eliminated is the evening dose.        General discharge instructions: Follow with Primary MD Shlomo Castilla I, NP in 7 days  Please request your PCP  to go over your hospital tests, procedures, radiology results at the follow up. Please get your medicines reviewed and adjusted.  Your PCP may decide to repeat certain labs or tests as needed. Do not drive, operate heavy machinery, perform activities at heights, swimming or participation in water  activities or provide baby sitting services if your were admitted for syncope or siezures until you have seen by Primary MD or a Neurologist and advised to do so again. [place]  Controlled Substance Reporting System database was reviewed. Do not drive, operate heavy machinery, perform activities at heights, swim, participate in water  activities or provide baby-sitting services while on medications for pain, sleep and mood until your outpatient physician has reevaluated you and advised to do so again.  You are strongly recommended to comply with the dose, frequency and duration of prescribed medications. Activity: As tolerated with Full fall precautions use [place]/cane & assistance as needed Avoid using any recreational substances like cigarette, tobacco, alcohol, or non-prescribed drug. If you experience worsening of your admission symptoms, develop shortness of breath, life threatening emergency, suicidal or homicidal thoughts you must seek medical  attention immediately by calling 911 or calling your MD immediately  if symptoms less severe. You must read complete instructions/literature along with all the possible adverse reactions/side effects for all the medicines you take and that have been prescribed to you. Take any new medicine only after you have completely understood and accepted all the possible adverse reactions/side effects.  Wear Seat belts while driving. You were cared for by a hospitalist during your hospital stay. If you have any questions about your discharge medications or the care you received while you were in the hospital after you are discharged, you can call the unit and ask to speak with the hospitalist or the covering physician. Once you are discharged, your primary care physician will handle any further medical issues. Please note that NO REFILLS for any discharge medications will be authorized once you are discharged, as it is imperative that you return to your primary care physician (or establish a relationship with a primary care physician if you do not have one).   Discharge wound care:   Complete by: As directed    Increase activity slowly   Complete by: As directed        Discharge Exam:   Vitals:   01/29/24 0900 01/29/24 2017 01/30/24 0552 01/30/24 0732  BP: 135/60 (!) 133/55 126/65 (!) 131/47  Pulse: 70 (!) 58 60 (!) 57  Resp: 19 17 18 18   Temp: 98.1 F (36.7 C) 98.2 F (36.8 C) 98.4 F (36.9 C) 98.2 F (36.8 C)  TempSrc:  Oral Oral Oral  SpO2:  97% 97% 99%  Weight:      Height:        Body mass index is 36.59 kg/m.  General exam: Pleasant, elderly Caucasian male.  Not in physical distress.  Skin: No rashes, lesions or ulcers. HEENT: Atraumatic, normocephalic, no obvious  bleeding Lungs: Clear to auscultation bilaterally,  CVS: S1, S2, no murmur,   GI/Abd: Soft, nontender, nondistended, bowel sound present,   CNS: Alert, awake, oriented x 3 Psychiatry: Mood appropriate Extremities:  Improving bilateral pedal edema, no calf tenderness,    The results of significant diagnostics from this hospitalization (including imaging, microbiology, ancillary and laboratory) are listed below for reference.    Procedures and Diagnostic Studies:   ECHOCARDIOGRAM COMPLETE Result Date: 01/11/2024    ECHOCARDIOGRAM REPORT   Patient Name:   Peter Bryan Date of Exam: 01/11/2024 Medical Rec #:  [id number]     Height:       71.0 in Accession #:    [id number]    Weight:       302.0 lb Date of Birth:  10-01-1960     BSA:          2.512 m Patient Age:    63 years      BP:           114/61 mmHg Patient Gender: M             HR:           69 bpm. Exam Location:  Inpatient Procedure: 2D Echo, Cardiac Doppler and Color Doppler (Both Spectral and Color            Flow Doppler were utilized during procedure). Indications:    Abnormal ECG  History:        Patient has no prior history of Echocardiogram examinations.                 Risk Factors:Sleep Apnea, Diabetes, Hypertension and                 Dyslipidemia.  Sonographer:    Sherlean Dubin Referring Phys: [id number] ABDULLAHI HUSSEIN  Sonographer Comments: Image acquisition challenging due to patient body habitus and Image acquisition challenging due to respiratory motion. IMPRESSIONS  1. Left ventricular ejection fraction, by estimation, is 60 to 65%. The left ventricle has normal function. The left ventricle has no regional wall motion abnormalities. Left ventricular diastolic parameters are consistent with Grade I diastolic dysfunction (impaired relaxation).  2. Right ventricular systolic function is normal. The right ventricular size is normal. There is normal pulmonary artery systolic pressure.  3. The mitral valve is normal in structure. Mild mitral valve regurgitation. No evidence of mitral stenosis.  4. The aortic valve is tricuspid. There is mild calcification of the aortic valve. Aortic valve regurgitation is not visualized. Aortic valve  sclerosis/calcification is present, without any evidence of aortic stenosis.  5. The inferior vena cava is dilated in size with <50% respiratory variability, suggesting right atrial pressure of 15 mmHg. FINDINGS  Left Ventricle: Left ventricular ejection fraction, by estimation, is 60 to 65%. The left ventricle has normal function. The left ventricle has no regional wall motion abnormalities. The left ventricular internal cavity size was normal in size. There is  no left ventricular hypertrophy. Left ventricular diastolic parameters are consistent with Grade I diastolic dysfunction (impaired relaxation). Right Ventricle: The right ventricular size is normal. No increase in right ventricular wall thickness. Right ventricular systolic function is normal. There is normal pulmonary artery systolic pressure. The tricuspid regurgitant velocity is 1.23 m/s, and  with an assumed right atrial pressure of 10 mmHg, the estimated right ventricular systolic pressure is 16.1 mmHg. Left Atrium: Left atrial size was normal in size. Right Atrium: Right atrial size was normal in size. Pericardium: There is  no evidence of pericardial effusion. Mitral Valve: The mitral valve is normal in structure. Mild mitral valve regurgitation. No evidence of mitral valve stenosis. MV peak gradient, 6.4 mmHg. The mean mitral valve gradient is 3.0 mmHg. Tricuspid Valve: The tricuspid valve is normal in structure. Tricuspid valve regurgitation is trivial. No evidence of tricuspid stenosis. Aortic Valve: The aortic valve is tricuspid. There is mild calcification of the aortic valve. Aortic valve regurgitation is not visualized. Aortic valve sclerosis/calcification is present, without any evidence of aortic stenosis. Aortic valve mean gradient measures 5.5 mmHg. Aortic valve peak gradient measures 10.8 mmHg. Aortic valve area, by VTI measures 2.79 cm. Pulmonic Valve: The pulmonic valve was normal in structure. Pulmonic valve regurgitation is not  visualized. No evidence of pulmonic stenosis. Aorta: The aortic root is normal in size and structure. Venous: The inferior vena cava is dilated in size with less than 50% respiratory variability, suggesting right atrial pressure of 15 mmHg. IAS/Shunts: No atrial level shunt detected by color flow Doppler.  LEFT VENTRICLE PLAX 2D LVIDd:         5.90 cm   Diastology LVIDs:         4.30 cm   LV e' medial:    6.22 cm/s LV PW:         1.30 cm   LV E/e' medial:  13.3 LV IVS:        0.80 cm   LV e' lateral:   9.32 cm/s LVOT diam:     2.10 cm   LV E/e' lateral: 8.9 LV SV:         81 LV SV Index:   32 LVOT Area:     3.46 cm  RIGHT VENTRICLE             IVC RV Basal diam:  3.90 cm     IVC diam: 2.30 cm RV Mid diam:    3.40 cm RV S prime:     15.20 cm/s TAPSE (M-mode): 1.8 cm LEFT ATRIUM           Index        RIGHT ATRIUM           Index LA diam:      4.20 cm 1.67 cm/m   RA Area:     16.20 cm LA Vol (A2C): 46.4 ml 18.47 ml/m  RA Volume:   40.40 ml  16.08 ml/m LA Vol (A4C): 51.7 ml 20.58 ml/m  AORTIC VALVE AV Area (Vmax):    2.75 cm AV Area (Vmean):   2.66 cm AV Area (VTI):     2.79 cm AV Vmax:           164.00 cm/s AV Vmean:          109.500 cm/s AV VTI:            0.292 m AV Peak Grad:      10.8 mmHg AV Mean Grad:      5.5 mmHg LVOT Vmax:         130.00 cm/s LVOT Vmean:        84.250 cm/s LVOT VTI:          0.234 m LVOT/AV VTI ratio: 0.80  AORTA Ao Root diam: 3.50 cm Ao Asc diam:  3.50 cm MITRAL VALVE                TRICUSPID VALVE MV Area (PHT): 3.27 cm     TR Peak grad:   6.1 mmHg MV Area VTI:  1.96 cm     TR Vmax:        123.00 cm/s MV Peak grad:  6.4 mmHg MV Mean grad:  3.0 mmHg     SHUNTS MV Vmax:       1.26 m/s     Systemic VTI:  0.23 m MV Vmean:      74.8 cm/s    Systemic Diam: 2.10 cm MV Decel Time: 232 msec MR Peak grad: 15.1 mmHg MR Vmax:      194.00 cm/s MV E velocity: 82.60 cm/s MV A velocity: 118.00 cm/s MV E/A ratio:  0.70 Toribio Fuel MD Electronically signed by Toribio Fuel MD Signature  Date/Time: 01/11/2024/3:45:59 PM    Final    DG CHEST PORT 1 VIEW Result Date: 01/10/2024 EXAM: 1 VIEW(S) XRAY OF THE CHEST 01/10/2024 11:05:00 AM COMPARISON: None available. CLINICAL HISTORY: Encounter for central line placement. FINDINGS: LINES, TUBES AND DEVICES: Right central line placement with the tip in the SVC. LUNGS AND PLEURA: No focal pulmonary opacity. No pleural effusion. No pneumothorax. Elevation of the right hemidiaphragm, stable. HEART AND MEDIASTINUM: Cardiomegaly. Vascular congestion. BONES AND SOFT TISSUES: No acute osseous abnormality. IMPRESSION: 1. Right central line placement with the tip in the SVC, without pneumothorax. 2. Cardiomegaly and vascular congestion. Electronically signed by: Franky Crease MD 01/10/2024 11:19 PM EST RP Workstation: [id number]   CT CHEST ABDOMEN PELVIS WO CONTRAST Result Date: 01/10/2024 EXAM: CT CHEST, ABDOMEN AND PELVIS WITHOUT CONTRAST 01/10/2024 09:01:58 PM TECHNIQUE: CT of the chest, abdomen and pelvis was performed without the administration of intravenous contrast. Multiplanar reformatted images are provided for review. Automated exposure control, iterative reconstruction, and/or weight based adjustment of the mA/kV was utilized to reduce the radiation dose to as low as reasonably achievable. COMPARISON: 10/03/2021 CLINICAL HISTORY: Sepsis FINDINGS: CHEST: MEDIASTINUM AND LYMPH NODES: Coronary artery aortic atherosclerosis. Heart and pericardium are unremarkable. The central airways are clear. No mediastinal, hilar or axillary lymphadenopathy. LUNGS AND PLEURA: Bibasilar dependent atelectasis. More confluent opacity in the left lower lobe could reflect early pneumonia. No pleural effusion. No pneumothorax. ABDOMEN AND PELVIS: LIVER: Unremarkable. GALLBLADDER AND BILE DUCTS: Unremarkable. No biliary ductal dilatation. SPLEEN: No acute abnormality. PANCREAS: No acute abnormality. ADRENAL GLANDS: No acute abnormality. KIDNEYS, URETERS AND BLADDER: 5.7 cm simple  appearing cyst off the upper pole of the right kidney. Per consensus, no follow-up is needed for simple Bosniak type 1 and 2 renal cysts, unless the patient has a malignancy history or risk factors. Foley catheter in the bladder, which is decompressed. No stones in the kidneys or ureters. No hydronephrosis. No perinephric or periureteral stranding. GI AND BOWEL: Stomach demonstrates no acute abnormality. There is no bowel obstruction. REPRODUCTIVE ORGANS: No acute abnormality. PERITONEUM AND RETROPERITONEUM: No ascites. No free air. VASCULATURE: Aorta is normal in caliber. Aortic atherosclerosis. ABDOMINAL AND PELVIS LYMPH NODES: No lymphadenopathy. BONES AND SOFT TISSUES: Postoperative changes in the spine. Advanced degenerative changes throughout the spine. No acute bony abnormality. No focal soft tissue abnormality. IMPRESSION: 1. Bibasilar dependent atelectasis. Oopacity in the left lower lobe, possibly representing early pneumonia. 2. Coronary artery disease, aortic atherosclerosis. Electronically signed by: Franky Crease MD 01/10/2024 09:10 PM EST RP Workstation: [id number]   DG Chest Port 1 View Result Date: 01/10/2024 EXAM: 1 VIEW(S) XRAY OF THE CHEST 01/10/2024 06:05:00 PM COMPARISON: Prior study dated 12/17/2020. CLINICAL HISTORY: Questionable sepsis - evaluate for abnormality FINDINGS: LUNGS AND PLEURA: Shallow inspiration. Linear atelectasis in the lung bases. No pleural effusion. No pneumothorax. HEART AND MEDIASTINUM: Cardiac  enlargement. Mediastinal contours appear intact. BONES AND SOFT TISSUES: Postoperative changes in the cervical and thoracic spine. IMPRESSION: 1. No acute cardiopulmonary abnormality. 2. Cardiomegaly. Electronically signed by: Elsie Gravely MD 01/10/2024 06:17 PM EST RP Workstation: [id number]     Labs:   Basic Metabolic Panel: Recent Labs  Lab 01/26/24 0414 01/26/24 1322 01/27/24 9391 01/28/24 0645 01/29/24 0637 01/30/24 0308  NA 136 134* 136 135 137 136  K 5.8*  5.3* 5.4* 4.8 5.2* 5.1  CL 105 103 105 105 106 106  CO2 18* 16* 19* 19* 20* 19*  GLUCOSE 97 124* 100* 96 90 91  BUN 95* 97* 94* 89* 86* 82*  CREATININE 3.16* 3.27* 2.98* 2.77* 2.66* 2.80*  CALCIUM  9.7 9.6 9.9 10.1 10.2 10.2  PHOS 4.9*  --  5.3* 4.8* 4.8* 4.2   GFR Estimated Creatinine Clearance: 34.5 mL/min (A) (by C-G formula based on SCr of 2.8 mg/dL (H)). Liver Function Tests: Recent Labs  Lab 01/26/24 0414 01/27/24 9391 01/28/24 0645 01/29/24 0637 01/30/24 0308  ALBUMIN 3.4* 3.3* 3.6 3.6 3.6   No results for input(s): LIPASE, AMYLASE in the last 168 hours. No results for input(s): AMMONIA in the last 168 hours. Coagulation profile No results for input(s): INR, PROTIME in the last 168 hours.  CBC: No results for input(s): WBC, NEUTROABS, HGB, HCT, MCV, PLT in the last 168 hours. Cardiac Enzymes: No results for input(s): CKTOTAL, CKMB, CKMBINDEX, TROPONINI in the last 168 hours. BNP: Invalid input(s): POCBNP CBG: No results for input(s): GLUCAP in the last 168 hours. D-Dimer No results for input(s): DDIMER in the last 72 hours. Hgb A1c No results for input(s): HGBA1C in the last 72 hours. Lipid Profile No results for input(s): CHOL, HDL, LDLCALC, TRIG, CHOLHDL, LDLDIRECT in the last 72 hours. Thyroid  function studies No results for input(s): TSH, T4TOTAL, T3FREE, THYROIDAB in the last 72 hours.  Invalid input(s): FREET3 Anemia work up No results for input(s): VITAMINB12, FOLATE, FERRITIN, TIBC, IRON, RETICCTPCT in the last 72 hours. Microbiology No results found for this or any previous visit (from the past 240 hours).  Time coordinating discharge: 45 minutes  Signed: Zephyr Ridley  [facility] 01/30/2024, 11:28 AM  "
